# Patient Record
Sex: Female | Born: 1955
Health system: Southern US, Community
[De-identification: ages and names within clinical notes are randomized; demographics above are authoritative.]

## PROBLEM LIST (undated history)

## (undated) DIAGNOSIS — R609 Edema, unspecified: Secondary | ICD-10-CM

## (undated) DIAGNOSIS — E785 Hyperlipidemia, unspecified: Secondary | ICD-10-CM

## (undated) DIAGNOSIS — K529 Noninfective gastroenteritis and colitis, unspecified: Secondary | ICD-10-CM

## (undated) DIAGNOSIS — E559 Vitamin D deficiency, unspecified: Secondary | ICD-10-CM

## (undated) DIAGNOSIS — F419 Anxiety disorder, unspecified: Secondary | ICD-10-CM

## (undated) DIAGNOSIS — Z9889 Other specified postprocedural states: Secondary | ICD-10-CM

## (undated) DIAGNOSIS — R112 Nausea with vomiting, unspecified: Secondary | ICD-10-CM

## (undated) DIAGNOSIS — U071 COVID-19: Secondary | ICD-10-CM

## (undated) DIAGNOSIS — K219 Gastro-esophageal reflux disease without esophagitis: Secondary | ICD-10-CM

## (undated) DIAGNOSIS — M1990 Unspecified osteoarthritis, unspecified site: Secondary | ICD-10-CM

## (undated) DIAGNOSIS — I82402 Acute embolism and thrombosis of unspecified deep veins of left lower extremity: Secondary | ICD-10-CM

## (undated) DIAGNOSIS — Z9289 Personal history of other medical treatment: Secondary | ICD-10-CM

## (undated) DIAGNOSIS — N951 Menopausal and female climacteric states: Secondary | ICD-10-CM

## (undated) HISTORY — DX: Edema, unspecified: R60.9

## (undated) HISTORY — PX: ANTERIOR CRUCIATE LIGAMENT REPAIR: SHX115

## (undated) HISTORY — PX: BREAST BIOPSY: SHX20

## (undated) HISTORY — DX: Vitamin D deficiency, unspecified: E55.9

## (undated) HISTORY — PX: AUGMENTATION MAMMAPLASTY: SUR837

## (undated) HISTORY — DX: Hyperlipidemia, unspecified: E78.5

## (undated) HISTORY — PX: BREAST SURGERY: SHX581

## (undated) HISTORY — DX: Menopausal and female climacteric states: N95.1

## (undated) HISTORY — DX: Anxiety disorder, unspecified: F41.9

## (undated) HISTORY — DX: Gastro-esophageal reflux disease without esophagitis: K21.9

## (undated) HISTORY — PX: TONSILLECTOMY: SUR1361

---

## 1997-08-09 ENCOUNTER — Other Ambulatory Visit: Admission: RE | Admit: 1997-08-09 | Discharge: 1997-08-09 | Payer: Self-pay | Admitting: Obstetrics and Gynecology

## 1998-08-23 ENCOUNTER — Other Ambulatory Visit: Admission: RE | Admit: 1998-08-23 | Discharge: 1998-08-23 | Payer: Self-pay | Admitting: Obstetrics and Gynecology

## 1998-09-07 ENCOUNTER — Other Ambulatory Visit: Admission: RE | Admit: 1998-09-07 | Discharge: 1998-09-07 | Payer: Self-pay | Admitting: Radiology

## 1998-10-06 ENCOUNTER — Ambulatory Visit (HOSPITAL_COMMUNITY): Admission: RE | Admit: 1998-10-06 | Discharge: 1998-10-06 | Payer: Self-pay

## 1999-08-23 ENCOUNTER — Other Ambulatory Visit: Admission: RE | Admit: 1999-08-23 | Discharge: 1999-08-23 | Payer: Self-pay | Admitting: Obstetrics and Gynecology

## 1999-12-18 ENCOUNTER — Encounter (INDEPENDENT_AMBULATORY_CARE_PROVIDER_SITE_OTHER): Payer: Self-pay | Admitting: *Deleted

## 1999-12-18 ENCOUNTER — Ambulatory Visit (HOSPITAL_COMMUNITY): Admission: RE | Admit: 1999-12-18 | Discharge: 1999-12-18 | Payer: Self-pay

## 2000-09-03 ENCOUNTER — Other Ambulatory Visit: Admission: RE | Admit: 2000-09-03 | Discharge: 2000-09-03 | Payer: Self-pay | Admitting: Obstetrics and Gynecology

## 2001-09-25 ENCOUNTER — Other Ambulatory Visit: Admission: RE | Admit: 2001-09-25 | Discharge: 2001-09-25 | Payer: Self-pay | Admitting: Obstetrics and Gynecology

## 2002-10-13 ENCOUNTER — Other Ambulatory Visit: Admission: RE | Admit: 2002-10-13 | Discharge: 2002-10-13 | Payer: Self-pay | Admitting: Obstetrics and Gynecology

## 2003-01-27 ENCOUNTER — Encounter: Admission: RE | Admit: 2003-01-27 | Discharge: 2003-01-27 | Payer: Self-pay | Admitting: Obstetrics and Gynecology

## 2003-01-27 ENCOUNTER — Encounter: Payer: Self-pay | Admitting: Obstetrics and Gynecology

## 2003-10-20 ENCOUNTER — Other Ambulatory Visit: Admission: RE | Admit: 2003-10-20 | Discharge: 2003-10-20 | Payer: Self-pay | Admitting: Obstetrics and Gynecology

## 2004-06-15 ENCOUNTER — Encounter: Admission: RE | Admit: 2004-06-15 | Discharge: 2004-06-15 | Payer: Self-pay | Admitting: Obstetrics and Gynecology

## 2004-10-26 ENCOUNTER — Other Ambulatory Visit: Admission: RE | Admit: 2004-10-26 | Discharge: 2004-10-26 | Payer: Self-pay | Admitting: Obstetrics and Gynecology

## 2005-08-03 ENCOUNTER — Encounter: Admission: RE | Admit: 2005-08-03 | Discharge: 2005-08-03 | Payer: Self-pay | Admitting: Obstetrics and Gynecology

## 2005-10-29 ENCOUNTER — Other Ambulatory Visit: Admission: RE | Admit: 2005-10-29 | Discharge: 2005-10-29 | Payer: Self-pay | Admitting: Obstetrics and Gynecology

## 2006-09-24 ENCOUNTER — Encounter: Admission: RE | Admit: 2006-09-24 | Discharge: 2006-09-24 | Payer: Self-pay | Admitting: Obstetrics and Gynecology

## 2006-11-26 ENCOUNTER — Other Ambulatory Visit: Admission: RE | Admit: 2006-11-26 | Discharge: 2006-11-26 | Payer: Self-pay | Admitting: Obstetrics and Gynecology

## 2007-11-26 ENCOUNTER — Encounter: Admission: RE | Admit: 2007-11-26 | Discharge: 2007-11-26 | Payer: Self-pay | Admitting: Internal Medicine

## 2008-04-01 ENCOUNTER — Encounter: Payer: Self-pay | Admitting: Gastroenterology

## 2008-04-02 ENCOUNTER — Encounter: Payer: Self-pay | Admitting: Gastroenterology

## 2008-04-03 ENCOUNTER — Encounter: Payer: Self-pay | Admitting: Gastroenterology

## 2008-04-04 ENCOUNTER — Encounter: Payer: Self-pay | Admitting: Gastroenterology

## 2008-04-04 HISTORY — PX: COLONOSCOPY: SHX174

## 2008-04-05 ENCOUNTER — Encounter: Payer: Self-pay | Admitting: Gastroenterology

## 2008-04-08 ENCOUNTER — Ambulatory Visit: Payer: Self-pay | Admitting: Internal Medicine

## 2008-04-08 ENCOUNTER — Telehealth: Payer: Self-pay | Admitting: Nurse Practitioner

## 2008-04-12 ENCOUNTER — Ambulatory Visit: Payer: Self-pay | Admitting: Gastroenterology

## 2008-04-12 DIAGNOSIS — K559 Vascular disorder of intestine, unspecified: Secondary | ICD-10-CM

## 2008-04-26 ENCOUNTER — Ambulatory Visit: Payer: Self-pay | Admitting: Internal Medicine

## 2008-11-29 ENCOUNTER — Ambulatory Visit: Payer: Self-pay | Admitting: Internal Medicine

## 2008-12-13 ENCOUNTER — Ambulatory Visit: Payer: Self-pay | Admitting: Internal Medicine

## 2009-02-03 ENCOUNTER — Ambulatory Visit: Payer: Self-pay | Admitting: Internal Medicine

## 2009-02-14 ENCOUNTER — Ambulatory Visit: Payer: Self-pay | Admitting: Internal Medicine

## 2009-05-05 ENCOUNTER — Ambulatory Visit: Payer: Self-pay | Admitting: Internal Medicine

## 2009-07-18 ENCOUNTER — Ambulatory Visit: Payer: Self-pay | Admitting: Internal Medicine

## 2009-07-25 ENCOUNTER — Ambulatory Visit: Payer: Self-pay | Admitting: Internal Medicine

## 2009-08-01 ENCOUNTER — Ambulatory Visit: Payer: Self-pay | Admitting: Internal Medicine

## 2009-08-16 ENCOUNTER — Ambulatory Visit: Payer: Self-pay | Admitting: Internal Medicine

## 2009-11-29 ENCOUNTER — Ambulatory Visit: Payer: Self-pay | Admitting: Internal Medicine

## 2009-12-02 ENCOUNTER — Encounter: Admission: RE | Admit: 2009-12-02 | Discharge: 2009-12-02 | Payer: Self-pay | Admitting: Internal Medicine

## 2009-12-20 ENCOUNTER — Encounter: Admission: RE | Admit: 2009-12-20 | Discharge: 2009-12-20 | Payer: Self-pay | Admitting: Internal Medicine

## 2009-12-20 ENCOUNTER — Ambulatory Visit: Payer: Self-pay | Admitting: Internal Medicine

## 2010-04-07 ENCOUNTER — Ambulatory Visit: Payer: Self-pay | Admitting: Internal Medicine

## 2010-06-29 DIAGNOSIS — E785 Hyperlipidemia, unspecified: Secondary | ICD-10-CM

## 2010-09-07 ENCOUNTER — Other Ambulatory Visit: Payer: Self-pay | Admitting: Specialist

## 2010-09-07 DIAGNOSIS — M25521 Pain in right elbow: Secondary | ICD-10-CM

## 2010-09-15 ENCOUNTER — Other Ambulatory Visit: Payer: Self-pay | Admitting: Specialist

## 2010-09-15 DIAGNOSIS — M25521 Pain in right elbow: Secondary | ICD-10-CM

## 2010-09-18 ENCOUNTER — Ambulatory Visit
Admission: RE | Admit: 2010-09-18 | Discharge: 2010-09-18 | Disposition: A | Discharge status: Home / Self Care | Payer: BC Managed Care – PPO | Source: Ambulatory Visit | Attending: Specialist | Admitting: Specialist

## 2010-09-18 DIAGNOSIS — M25521 Pain in right elbow: Secondary | ICD-10-CM

## 2010-09-22 NOTE — Op Note (Signed)
[employer]  Patient:    Julia Murray, Julia Murray                       MRN: [id number] Proc. Date: 12/18/99 Adm. Date:  [id number] Disc. Date: [id number] Attending:  Gennie Alma CC:         Rande Brunt. Eda Paschal, M.D.   Operative Report  PREOPERATIVE DIAGNOSIS:  Nodule of right breast, subareolar.  POSTOPERATIVE DIAGNOSIS:  Nodule of right breast, subareolar.  OPERATION:  Excision of nodule of right breast, right breast biopsy.  SURGEON:  Milus Mallick, M.D.  ANESTHESIA:  Local infiltration with 1% Xylocaine - 15 cc and monitored anesthesia care.  DESCRIPTION OF PROCEDURE:  Under adequate perioperative intravenous sedation, the patients right breast was prepared and draped in usual fashion.  There was a 2 cm in diameter palpable nodule subadjacent to the inferior portion of the areolar of the right breast.  The area in question was infiltrated with 1% Xylocaine.  A periareolar incision was made from the 8 oclock to 4 oclock radial and carried down through subcutaneous tissue.  The breast tissue was exposed and the mass in question was palpated.  It was totally excised from the breast tissue surrounding it with electrocoagulation.  The specimen had the appearance of a fibroadenoma.  It was sent for routine pathologic study. The defect in the breast tissue was closed with interrupted sutures of 3-0 chromic catgut.  The subcuticular layer was then reapproximated with interrupted sutures of 5-0 Vicryl and the skin was approximated with half-inch Steri-Strips.  Sterile dressing were applied.  Estimated blood loss for the procedure was negligible.  The patient tolerated the procedure well and left the operating room in satisfactory condition. DD:  12/18/99 TD:  12/18/99 Job: [id number]

## 2010-10-04 ENCOUNTER — Other Ambulatory Visit: Payer: Self-pay

## 2010-11-01 ENCOUNTER — Encounter: Payer: Self-pay | Admitting: Internal Medicine

## 2010-11-06 ENCOUNTER — Ambulatory Visit: Payer: BC Managed Care – PPO | Admitting: Internal Medicine

## 2010-11-24 ENCOUNTER — Ambulatory Visit: Payer: BC Managed Care – PPO | Admitting: Internal Medicine

## 2010-11-24 ENCOUNTER — Other Ambulatory Visit: Payer: BC Managed Care – PPO | Admitting: Internal Medicine

## 2010-11-24 DIAGNOSIS — E785 Hyperlipidemia, unspecified: Secondary | ICD-10-CM

## 2010-11-24 DIAGNOSIS — Z79899 Other long term (current) drug therapy: Secondary | ICD-10-CM

## 2010-11-24 LAB — HEPATIC FUNCTION PANEL
ALT: 18 U/L (ref 0–35)
AST: 20 U/L (ref 0–37)
Alkaline Phosphatase: 63 U/L (ref 39–117)
Bilirubin, Direct: 0.2 mg/dL (ref 0.0–0.3)
Total Bilirubin: 0.6 mg/dL (ref 0.3–1.2)

## 2010-11-24 LAB — LIPID PANEL
Cholesterol: 210 mg/dL — ABNORMAL HIGH (ref 0–200)
HDL: 93 mg/dL (ref >39)
Total CHOL/HDL Ratio: 2.3 Ratio

## 2010-12-08 ENCOUNTER — Ambulatory Visit: Payer: BC Managed Care – PPO | Admitting: Internal Medicine

## 2011-05-09 ENCOUNTER — Ambulatory Visit (INDEPENDENT_AMBULATORY_CARE_PROVIDER_SITE_OTHER): Payer: BC Managed Care – PPO | Admitting: Internal Medicine

## 2011-05-09 ENCOUNTER — Encounter: Payer: Self-pay | Admitting: Internal Medicine

## 2011-05-09 VITALS — BP 128/82 | HR 84 | Temp 98.5°F | Wt 179.5 lb

## 2011-05-09 DIAGNOSIS — J069 Acute upper respiratory infection, unspecified: Secondary | ICD-10-CM

## 2011-05-14 ENCOUNTER — Other Ambulatory Visit: Payer: Self-pay | Admitting: Internal Medicine

## 2011-05-14 NOTE — Progress Notes (Signed)
  Subjective:    Patient ID: Julia Murray, female    DOB: 1956-02-21, 56 y.o.   MRN: [id number]  HPI Patient in today with cough and congestion. No significant sore throat. No fever or shaking chills. Just has malaise and fatigue. Onset of symptoms yesterday. Doesn't feel like working.    Review of Systems     Objective:   Physical Exam HEENT exam: Slightly injected pharynx without exudates; TMs are clear; neck is supple without adenopathy; chest clear        Assessment & Plan:  URI  Plan: Zithromax Z-Pak take 2 tablets day one followed by 1 tablet days 2 through 5. Hycodan 8 ounces 1 teaspoon by mouth every 6 hours when necessary cough

## 2011-05-14 NOTE — Patient Instructions (Signed)
Take Zithromax as directed. Take Hycodan as needed for cough. Tylenol as needed for fever or myalgias. Call if not better in one week or sooner if worse.

## 2011-06-22 ENCOUNTER — Telehealth: Payer: Self-pay | Admitting: Internal Medicine

## 2011-06-25 ENCOUNTER — Other Ambulatory Visit: Payer: Self-pay

## 2011-06-25 MED ORDER — ATORVASTATIN CALCIUM 20 MG PO TABS: 20.0000 mg | ORAL_TABLET | Freq: Every day | ORAL | Status: DC

## 2011-06-25 NOTE — Telephone Encounter (Signed)
Please call in Atoravastin (lipitor) with prn one year refills to [facility].

## 2011-07-02 ENCOUNTER — Telehealth: Payer: Self-pay

## 2011-07-02 NOTE — Telephone Encounter (Signed)
Left message on patient's home a nd work phone no. Lipitor rx could not be E-rx'd because her benefits are not managed by [facility] or [facility] any longer. Left message on 06/26/2011 as well, to get in touch with [place].

## 2011-07-06 ENCOUNTER — Other Ambulatory Visit: Payer: Self-pay

## 2011-07-06 MED ORDER — ATORVASTATIN CALCIUM 20 MG PO TABS: 20.0000 mg | ORAL_TABLET | Freq: Every day | ORAL | Status: DC

## 2011-07-06 MED ORDER — ESOMEPRAZOLE MAGNESIUM 40 MG PO CPDR: 40.0000 mg | DELAYED_RELEASE_CAPSULE | Freq: Every day | ORAL | Status: DC

## 2011-09-13 ENCOUNTER — Other Ambulatory Visit: Payer: Self-pay | Admitting: Internal Medicine

## 2012-01-23 ENCOUNTER — Other Ambulatory Visit: Payer: Self-pay

## 2012-01-23 DIAGNOSIS — M7989 Other specified soft tissue disorders: Secondary | ICD-10-CM

## 2012-01-23 DIAGNOSIS — I83893 Varicose veins of bilateral lower extremities with other complications: Secondary | ICD-10-CM

## 2012-02-12 ENCOUNTER — Encounter: Payer: BC Managed Care – PPO | Admitting: Vascular Surgery

## 2012-03-03 ENCOUNTER — Encounter: Payer: Self-pay | Admitting: Vascular Surgery

## 2012-03-04 ENCOUNTER — Encounter: Payer: BC Managed Care – PPO | Admitting: Vascular Surgery

## 2012-03-24 ENCOUNTER — Encounter: Payer: Self-pay | Admitting: Vascular Surgery

## 2012-03-25 ENCOUNTER — Encounter: Payer: BC Managed Care – PPO | Admitting: Vascular Surgery

## 2012-03-28 ENCOUNTER — Other Ambulatory Visit: Payer: BC Managed Care – PPO | Admitting: Internal Medicine

## 2012-03-28 DIAGNOSIS — Z0000 Encounter for general adult medical examination without abnormal findings: Secondary | ICD-10-CM

## 2012-03-28 DIAGNOSIS — Z79899 Other long term (current) drug therapy: Secondary | ICD-10-CM

## 2012-03-28 DIAGNOSIS — E785 Hyperlipidemia, unspecified: Secondary | ICD-10-CM

## 2012-03-29 LAB — COMPREHENSIVE METABOLIC PANEL
ALT: 14 U/L (ref 0–35)
AST: 15 U/L (ref 0–37)
Alkaline Phosphatase: 44 U/L (ref 39–117)
BUN: 18 mg/dL (ref 6–23)
Calcium: 9.4 mg/dL (ref 8.4–10.5)
Creat: 0.55 mg/dL (ref 0.50–1.10)
Total Bilirubin: 0.5 mg/dL (ref 0.3–1.2)

## 2012-03-29 LAB — LIPID PANEL
HDL: 84 mg/dL (ref >39)
LDL Cholesterol: 123 mg/dL — ABNORMAL HIGH (ref 0–99)
Total CHOL/HDL Ratio: 2.8 Ratio
VLDL: 31 mg/dL (ref 0–40)

## 2012-03-29 LAB — CBC WITH DIFFERENTIAL/PLATELET
Basophils Absolute: 0 10*3/uL (ref 0.0–0.1)
Basophils Relative: 0 % (ref 0–1)
Eosinophils Absolute: 0.1 10*3/uL (ref 0.0–0.7)
Eosinophils Relative: 2 % (ref 0–5)
HCT: 38.9 % (ref 36.0–46.0)
MCHC: 33.2 g/dL (ref 30.0–36.0)
MCV: 90.3 fL (ref 78.0–100.0)
Monocytes Absolute: 0.5 10*3/uL (ref 0.1–1.0)
Platelets: 294 10*3/uL (ref 150–400)
RDW: 13.9 % (ref 11.5–15.5)
WBC: 6.1 10*3/uL (ref 4.0–10.5)

## 2012-03-29 LAB — TSH: TSH: 1.238 u[IU]/mL (ref 0.350–4.500)

## 2012-03-31 ENCOUNTER — Ambulatory Visit (INDEPENDENT_AMBULATORY_CARE_PROVIDER_SITE_OTHER): Payer: BC Managed Care – PPO | Admitting: Internal Medicine

## 2012-03-31 ENCOUNTER — Encounter: Payer: Self-pay | Admitting: Internal Medicine

## 2012-03-31 VITALS — BP 132/78 | HR 84 | Temp 98.2°F | Ht 65.0 in | Wt 179.0 lb

## 2012-03-31 DIAGNOSIS — M25562 Pain in left knee: Secondary | ICD-10-CM

## 2012-03-31 DIAGNOSIS — Z0000 Encounter for general adult medical examination without abnormal findings: Secondary | ICD-10-CM

## 2012-03-31 DIAGNOSIS — Z23 Encounter for immunization: Secondary | ICD-10-CM

## 2012-03-31 DIAGNOSIS — E785 Hyperlipidemia, unspecified: Secondary | ICD-10-CM

## 2012-03-31 DIAGNOSIS — Z8639 Personal history of other endocrine, nutritional and metabolic disease: Secondary | ICD-10-CM

## 2012-03-31 DIAGNOSIS — F419 Anxiety disorder, unspecified: Secondary | ICD-10-CM

## 2012-03-31 DIAGNOSIS — M25569 Pain in unspecified knee: Secondary | ICD-10-CM

## 2012-03-31 DIAGNOSIS — K219 Gastro-esophageal reflux disease without esophagitis: Secondary | ICD-10-CM

## 2012-03-31 DIAGNOSIS — F411 Generalized anxiety disorder: Secondary | ICD-10-CM

## 2012-03-31 DIAGNOSIS — K559 Vascular disorder of intestine, unspecified: Secondary | ICD-10-CM

## 2012-03-31 LAB — POCT URINALYSIS DIPSTICK
Bilirubin, UA: NEGATIVE
Blood, UA: NEGATIVE
Glucose, UA: NEGATIVE
Leukocytes, UA: NEGATIVE
Nitrite, UA: NEGATIVE
Urobilinogen, UA: NEGATIVE

## 2012-03-31 MED ORDER — TETANUS-DIPHTH-ACELL PERTUSSIS 5-2.5-18.5 LF-MCG/0.5 IM SUSP: 0.5000 mL | Freq: Once | INTRAMUSCULAR | Status: DC

## 2012-03-31 MED ORDER — ATORVASTATIN CALCIUM 20 MG PO TABS: 20.0000 mg | ORAL_TABLET | Freq: Every day | ORAL | Status: DC

## 2012-04-01 MED ORDER — ATORVASTATIN CALCIUM 20 MG PO TABS: 20.0000 mg | ORAL_TABLET | Freq: Every day | ORAL | Status: DC

## 2012-04-04 DIAGNOSIS — K219 Gastro-esophageal reflux disease without esophagitis: Secondary | ICD-10-CM | POA: Insufficient documentation

## 2012-04-04 DIAGNOSIS — F419 Anxiety disorder, unspecified: Secondary | ICD-10-CM | POA: Insufficient documentation

## 2012-04-04 NOTE — Patient Instructions (Addendum)
Continue same medications and return in 6 months for office visit lipid panel liver functions

## 2012-04-04 NOTE — Progress Notes (Signed)
Subjective:    Patient ID: Julia Murray, female    DOB: [birth year], 56 y.o.   MRN: [id number]  HPI 56 year old white female in today for health maintenance exam. She is planning to have arthroscopic surgery in the near future on her knee and needs surgical clearance. She has a history of GE reflux and hyperlipidemia. She has a history of allergic rhinitis and has used Flonase in vera missed in the past. She has a history of vitamin D deficiency and has been told to take vitamin D 3 over-the-counter. GYN is Dr. Lavina Hamman.  In December 2009, she had a bout of ischemic colitis and was hospitalized in [facility]. This was left-sided ischemic colitis. She was treated with Flagyl and Levaquin. She had have a transfusion. Biopsies were compatible with ischemic colitis. She had a sudden bout of abdominal pain after coming back from eating a steak dinner. She's never had recurrence. Was seen by Dr. Russella Dar in followup. He did not feel she needed repeat colonoscopy after having inpatient colonoscopy at [facility].  No known drug allergies  Fractured left hand 1980s, torn anterior cruciate ligament 2007.  Has taken Maxzide daily for dependent edema, has taken Xanax when necessary anxiety. Was tried on Crestor for hyperlipidemia but she said it caused constipation. Since February 2012 on Lipitor 20 mg daily generic.  Patient is married. Husband is a vice president with [employer] 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 She completed 2 years of college. Does not smoke. Social alcohol consumption. Patient has 2 adult children a daughter and a son.  Family history: Father died at age 10 of heart failure. Mother with history of diabetes but living. One brother in good health. Father had gout, kidney stones and ulcers in addition to heart disease.     Review of Systems  Constitutional: Positive for fatigue.  HENT: Negative.   Eyes:  Negative.   Respiratory: Negative.   Cardiovascular: Negative.   Gastrointestinal:       History of ischemic colitis 2009  Genitourinary: Negative.   Musculoskeletal:       Left knee pain  Neurological: Negative.   Hematological: Negative.   Psychiatric/Behavioral:        Occasional anxiety       Objective:   Physical Exam  Vitals reviewed. Constitutional: She appears well-developed and well-nourished. No distress.  HENT:  Head: Normocephalic and atraumatic.  Right Ear: External ear normal.  Left Ear: External ear normal.  Mouth/Throat: Oropharynx is clear and moist. No oropharyngeal exudate.  Eyes: Conjunctivae normal and EOM are normal. Pupils are equal, round, and reactive to light. Right eye exhibits no discharge. Left eye exhibits no discharge. No scleral icterus.  Neck: Normal range of motion. Neck supple. No JVD present. No thyromegaly present.  Cardiovascular: Normal rate, regular rhythm, normal heart sounds and intact distal pulses.   No murmur heard. Pulmonary/Chest: Effort normal and breath sounds normal. She has no wheezes. She has no rales.       Breasts normal female. Tattoo upper left chest. Right nipple is pierced  Abdominal: Soft. Bowel sounds are normal. She exhibits no distension and no mass. There is no tenderness. There is no rebound and no guarding.  Genitourinary:       Deferred to GYN  Musculoskeletal: She exhibits no edema.  Lymphadenopathy:    She has no cervical adenopathy.  Neurological: She is alert. She has normal reflexes.  She displays normal reflexes. No cranial nerve deficit. Coordination normal.  Skin: Skin is warm and dry. No rash noted. She is not diaphoretic.  Psychiatric: She has a normal mood and affect. Her behavior is normal. Judgment and thought content normal.          Assessment & Plan:  Left knee internal derangement to have surgery by orthopedist in the near future  Hyperlipidemia-stable on Lipitor generic  Dependent  edema which she takes Maxzide 75/50 daily  Anxiety  History of vitamin D deficiency-patient is supposed to be taking vitamin D supplement over-the-counter  History of GE reflux treated with PPI  Plan: Patient cleared for orthopedic surgery which unstable be arthroscopic surgery. Return in 6 months for office visit lipid panel liver functions. No change in medication regimen.

## 2012-04-14 ENCOUNTER — Other Ambulatory Visit: Payer: Self-pay | Admitting: Orthopedic Surgery

## 2012-04-21 ENCOUNTER — Encounter: Payer: Self-pay | Admitting: Vascular Surgery

## 2012-04-22 ENCOUNTER — Encounter: Payer: Self-pay | Admitting: Vascular Surgery

## 2012-04-22 ENCOUNTER — Encounter (INDEPENDENT_AMBULATORY_CARE_PROVIDER_SITE_OTHER): Payer: BC Managed Care – PPO | Admitting: *Deleted

## 2012-04-22 ENCOUNTER — Ambulatory Visit (INDEPENDENT_AMBULATORY_CARE_PROVIDER_SITE_OTHER): Payer: BC Managed Care – PPO | Admitting: Vascular Surgery

## 2012-04-22 VITALS — BP 141/82 | HR 71 | Resp 16 | Ht 65.0 in | Wt 179.8 lb

## 2012-04-22 DIAGNOSIS — I8010 Phlebitis and thrombophlebitis of unspecified femoral vein: Secondary | ICD-10-CM

## 2012-04-22 DIAGNOSIS — M7989 Other specified soft tissue disorders: Secondary | ICD-10-CM

## 2012-04-22 DIAGNOSIS — I83893 Varicose veins of bilateral lower extremities with other complications: Secondary | ICD-10-CM

## 2012-04-22 NOTE — Progress Notes (Signed)
[facility]   Patient name: Julia Murray MRN: [id number] DOB: 27-Sep-1955 Sex: female   Referred by: Shelle Iron  Reason for referral:  Chief Complaint  Patient presents with  . Venous Insufficiency    New pt, left leg edema/ vv's on left - Dr. Jene Every    HISTORY OF PRESENT ILLNESS: Patient is a healthy 56 year old white female who seen today for evaluation of chronic swelling in her left leg. She reports that this occurred around the time of the knee surgery several years ago and has been stable since that time. She does not have any history of DVT. She reports the swelling is more progress towards the end of the day. She does not recall any trauma and is specifically denies any change over the past several years. She has no history of venous ulcers. She does not have any known history of DVT. Her family history is noted for "phlebitis" in her mother.  Past Medical History  Diagnosis Date  . Anxiety   . Hyperlipidemia   . Dependent edema   . Perimenopausal   . Vitamin D deficiency disease   . GERD (gastroesophageal reflux disease)     Past Surgical History  Procedure Date  . Anterior cruciate ligament repair     left    History   Social History  . Marital Status: Married    Spouse Name: N/A    Number of Children: N/A  . Years of Education: N/A   Occupational History  . Not on file.   Social History Main Topics  . Smoking status: Current Every Day Smoker    Types: Cigarettes  . Smokeless tobacco: Never Used  . Alcohol Use: 1.8 - 2.4 oz/week    3-4 Shots of liquor per week  . Drug Use: No  . Sexually Active: Not on file   Other Topics Concern  . Not on file   Social History Narrative  . No narrative on file    Family History  Problem Relation Age of Onset  . Heart disease Father   . Heart attack Father   . Diabetes Mother     Allergies as of 04/22/2012 - Review Complete 04/22/2012  Allergen Reaction Noted  . Shellfish  allergy Hives 05/09/2011    Current Outpatient Prescriptions on File Prior to Visit  Medication Sig Dispense Refill  . atorvastatin (LIPITOR) 20 MG tablet Take 1 tablet (20 mg total) by mouth daily.  90 tablet  1  . esomeprazole (NEXIUM) 40 MG capsule Take 1 capsule (40 mg total) by mouth daily before breakfast.  90 capsule  1  . estradiol (ESTRACE) 0.5 MG tablet Take 0.5 mg by mouth daily.      . Estradiol-Norethindrone Acet 0.5-0.1 MG per tablet       . fluticasone (FLONASE) 50 MCG/ACT nasal spray USE 2 SPRAYS IN EACH NOSTRIL EVERY MORNING  16 g  11  . fluticasone (VERAMYST) 27.5 MCG/SPRAY nasal spray Place 2 sprays into the nose daily.           REVIEW OF SYSTEMS:  Positives indicated with an "X"  CARDIOVASCULAR:  [ ]  chest pain   [ ]  chest pressure   [ ]  palpitations   [ ]  orthopnea   [ ]  dyspnea on exertion   [ ]  claudication   [ ]  rest pain   [ ]  DVT   [ ]  phlebitis PULMONARY:   [ ]  productive cough   [ ]  asthma   [ ]   wheezing NEUROLOGIC:   [ ]  weakness  [ ]  paresthesias  [ ]  aphasia  [ ]  amaurosis  [ ]  dizziness HEMATOLOGIC:   [ ]  bleeding problems   [ ]  clotting disorders MUSCULOSKELETAL:  [ ]  joint pain   [x ] joint swelling GASTROINTESTINAL: [ ]   blood in stool  [ ]   hematemesis GENITOURINARY:  [ ]   dysuria  [ ]   hematuria PSYCHIATRIC:  [ ]  history of major depression INTEGUMENTARY:  [ ]  rashes  [ ]  ulcers CONSTITUTIONAL:  [ ]  fever   [ ]  chills  PHYSICAL EXAMINATION:  General: The patient is a well-nourished female, in no acute distress. Vital signs are BP 141/82  Pulse 71  Resp 16  Ht 5\' 5"  (1.651 m)  Wt 179 lb 12.8 oz (81.557 kg)  BMI 29.92 kg/m2  SpO2 100% Pulmonary: There is a good air exchange bilaterally without wheezing or rales. Abdomen: Soft and non-tender with normal pitch bowel sounds. Musculoskeletal: There are no major deformities.  There is no significant extremity pain. Neurologic: No focal weakness or paresthesias are detected, Skin: There are no  ulcer or rashes noted. Psychiatric: The patient has normal affect. Cardiovascular: There is a regular rate and rhythm without significant murmur appreciated. Carotid arteries without bruits bilaterally 2+ dorsalis pedis pulses bilaterally She does have swelling in her left leg from her knee distally onto her foot.  [facility] Vascular Lab Studies:  Ordered and Independently Reviewed . This reveals thrombus in her left distal femoral vein and popliteal vein. This does have an acoustic shadowing which suggests chronic clot. She does not have any superficial venous reflux.  Impression and Plan:  Deep venous thrombosis in her left distal femoral vein and popliteal vein. This in all likelihood this chronic since she has had no changes in her symptoms regarding pain or swelling over the course of several years. I explained that this is duplex is not 100% at this is not acute however this would be quite unusual since she has had no change in her symptoms for years. I would not recommend anticoagulation. I did explain the importance of graduated compression and we fitted her today for knee-high compression garments explained the importance of the use of these for symptom relief now but more importantly for reduce risk for venous hypertension sequela in years to come. She understands this discussion will see [place] again on an as-needed basis    Neysha Criado [facility] [place] Office: [phone]

## 2012-04-22 NOTE — Addendum Note (Signed)
Addended by: Domenic Moras D on: 04/22/2012 01:51 PM   Modules accepted: Orders

## 2012-04-23 ENCOUNTER — Encounter (HOSPITAL_BASED_OUTPATIENT_CLINIC_OR_DEPARTMENT_OTHER): Payer: Self-pay | Admitting: *Deleted

## 2012-04-23 NOTE — Progress Notes (Signed)
Chart reviewed w Dr. Germeroth.  Ok to proceed. 

## 2012-04-25 ENCOUNTER — Encounter (HOSPITAL_BASED_OUTPATIENT_CLINIC_OR_DEPARTMENT_OTHER): Payer: Self-pay

## 2012-04-25 ENCOUNTER — Encounter (HOSPITAL_BASED_OUTPATIENT_CLINIC_OR_DEPARTMENT_OTHER)
Admission: RE | Disposition: A | Discharge status: Home / Self Care | Payer: Self-pay | Source: Ambulatory Visit | Attending: Specialist

## 2012-04-25 ENCOUNTER — Ambulatory Visit (HOSPITAL_BASED_OUTPATIENT_CLINIC_OR_DEPARTMENT_OTHER)
Admission: RE | Admit: 2012-04-25 | Discharge: 2012-04-25 | Disposition: A | Discharge status: Home / Self Care | Payer: BC Managed Care – PPO | Source: Ambulatory Visit | Attending: Specialist | Admitting: Specialist

## 2012-04-25 ENCOUNTER — Encounter (HOSPITAL_BASED_OUTPATIENT_CLINIC_OR_DEPARTMENT_OTHER): Payer: Self-pay | Admitting: Anesthesiology

## 2012-04-25 ENCOUNTER — Ambulatory Visit (HOSPITAL_BASED_OUTPATIENT_CLINIC_OR_DEPARTMENT_OTHER): Payer: BC Managed Care – PPO | Admitting: Anesthesiology

## 2012-04-25 ENCOUNTER — Other Ambulatory Visit: Payer: Self-pay

## 2012-04-25 DIAGNOSIS — M2340 Loose body in knee, unspecified knee: Secondary | ICD-10-CM | POA: Insufficient documentation

## 2012-04-25 DIAGNOSIS — Z79899 Other long term (current) drug therapy: Secondary | ICD-10-CM | POA: Insufficient documentation

## 2012-04-25 DIAGNOSIS — M1710 Unilateral primary osteoarthritis, unspecified knee: Secondary | ICD-10-CM

## 2012-04-25 DIAGNOSIS — Z86718 Personal history of other venous thrombosis and embolism: Secondary | ICD-10-CM | POA: Insufficient documentation

## 2012-04-25 DIAGNOSIS — E785 Hyperlipidemia, unspecified: Secondary | ICD-10-CM | POA: Insufficient documentation

## 2012-04-25 DIAGNOSIS — M23302 Other meniscus derangements, unspecified lateral meniscus, unspecified knee: Secondary | ICD-10-CM | POA: Insufficient documentation

## 2012-04-25 DIAGNOSIS — M19019 Primary osteoarthritis, unspecified shoulder: Secondary | ICD-10-CM | POA: Insufficient documentation

## 2012-04-25 DIAGNOSIS — M2350 Chronic instability of knee, unspecified knee: Secondary | ICD-10-CM | POA: Insufficient documentation

## 2012-04-25 DIAGNOSIS — K219 Gastro-esophageal reflux disease without esophagitis: Secondary | ICD-10-CM | POA: Insufficient documentation

## 2012-04-25 DIAGNOSIS — M23305 Other meniscus derangements, unspecified medial meniscus, unspecified knee: Secondary | ICD-10-CM | POA: Insufficient documentation

## 2012-04-25 HISTORY — DX: Noninfective gastroenteritis and colitis, unspecified: K52.9

## 2012-04-25 HISTORY — DX: Personal history of other medical treatment: Z92.89

## 2012-04-25 HISTORY — PX: KNEE ARTHROSCOPY WITH MEDIAL MENISECTOMY: SHX5651

## 2012-04-25 HISTORY — DX: Acute embolism and thrombosis of unspecified deep veins of left lower extremity: I82.402

## 2012-04-25 SURGERY — ARTHROSCOPY, KNEE, WITH MEDIAL MENISCECTOMY
Anesthesia: General | Site: Knee | Laterality: Right | Wound class: Clean

## 2012-04-25 MED ORDER — LACTATED RINGERS IV SOLN
INTRAVENOUS | Status: DC
  Filled 2012-04-25: qty 1000

## 2012-04-25 MED ORDER — FENTANYL CITRATE 0.05 MG/ML IJ SOLN
INTRAMUSCULAR | Status: DC | PRN
  Administered 2012-04-25 (×2): 50 ug via INTRAVENOUS
  Administered 2012-04-25 (×2): 25 ug via INTRAVENOUS
  Administered 2012-04-25: 50 ug via INTRAVENOUS

## 2012-04-25 MED ORDER — CEFAZOLIN SODIUM-DEXTROSE 2-3 GM-% IV SOLR
2.0000 g | INTRAVENOUS | Status: AC
  Administered 2012-04-25: 2 g via INTRAVENOUS
  Filled 2012-04-25: qty 50

## 2012-04-25 MED ORDER — MIDAZOLAM HCL 5 MG/5ML IJ SOLN
INTRAMUSCULAR | Status: DC | PRN
  Administered 2012-04-25: 2 mg via INTRAVENOUS

## 2012-04-25 MED ORDER — ACETAMINOPHEN 10 MG/ML IV SOLN
INTRAVENOUS | Status: DC | PRN
  Administered 2012-04-25: 1000 mg via INTRAVENOUS

## 2012-04-25 MED ORDER — LACTATED RINGERS IV SOLN
INTRAVENOUS | Status: DC
  Administered 2012-04-25 (×2): via INTRAVENOUS
  Filled 2012-04-25: qty 1000

## 2012-04-25 MED ORDER — DEXAMETHASONE SODIUM PHOSPHATE 4 MG/ML IJ SOLN
INTRAMUSCULAR | Status: DC | PRN
  Administered 2012-04-25: 10 mg via INTRAVENOUS

## 2012-04-25 MED ORDER — EPINEPHRINE HCL 1 MG/ML IJ SOLN
INTRAMUSCULAR | Status: DC | PRN
  Administered 2012-04-25: 2 mg

## 2012-04-25 MED ORDER — EPHEDRINE SULFATE 50 MG/ML IJ SOLN
INTRAMUSCULAR | Status: DC | PRN
  Administered 2012-04-25 (×2): 10 mg via INTRAVENOUS

## 2012-04-25 MED ORDER — SODIUM CHLORIDE 0.9 % IR SOLN
Status: DC | PRN
  Administered 2012-04-25: 6000 mL

## 2012-04-25 MED ORDER — BUPIVACAINE-EPINEPHRINE 0.5% -1:200000 IJ SOLN
INTRAMUSCULAR | Status: DC | PRN
  Administered 2012-04-25: 30 mL

## 2012-04-25 MED ORDER — PROPOFOL 10 MG/ML IV BOLUS
INTRAVENOUS | Status: DC | PRN
  Administered 2012-04-25: 200 mg via INTRAVENOUS

## 2012-04-25 MED ORDER — FENTANYL CITRATE 0.05 MG/ML IJ SOLN
25.0000 ug | INTRAMUSCULAR | Status: DC | PRN
  Filled 2012-04-25: qty 1

## 2012-04-25 MED ORDER — CHLORHEXIDINE GLUCONATE 4 % EX LIQD
60.0000 mL | Freq: Once | CUTANEOUS | Status: DC
  Filled 2012-04-25: qty 60

## 2012-04-25 MED ORDER — KETOROLAC TROMETHAMINE 30 MG/ML IJ SOLN
INTRAMUSCULAR | Status: DC | PRN
  Administered 2012-04-25: 30 mg via INTRAVENOUS

## 2012-04-25 MED ORDER — LIDOCAINE HCL (CARDIAC) 20 MG/ML IV SOLN
INTRAVENOUS | Status: DC | PRN
  Administered 2012-04-25: 60 mg via INTRAVENOUS

## 2012-04-25 MED ORDER — KETOROLAC TROMETHAMINE 10 MG PO TABS: 10.0000 mg | ORAL_TABLET | Freq: Four times a day (QID) | ORAL | Status: DC | PRN

## 2012-04-25 MED ORDER — ONDANSETRON HCL 4 MG/2ML IJ SOLN
INTRAMUSCULAR | Status: DC | PRN
  Administered 2012-04-25: 4 mg via INTRAVENOUS

## 2012-04-25 MED ORDER — OXYCODONE-ACETAMINOPHEN 5-325 MG PO TABS: 1.0000 | ORAL_TABLET | ORAL | Status: DC | PRN

## 2012-04-25 MED ORDER — PROMETHAZINE HCL 25 MG/ML IJ SOLN
6.2500 mg | INTRAMUSCULAR | Status: DC | PRN
  Filled 2012-04-25: qty 1

## 2012-04-25 SURGICAL SUPPLY — 45 items
BANDAGE ELASTIC 6 VELCRO ST LF (GAUZE/BANDAGES/DRESSINGS) ×2 IMPLANT
BLADE 4.2CUDA (BLADE) IMPLANT
BLADE CUDA SHAVER 3.5 (BLADE) ×2 IMPLANT
BLADE GREAT WHITE 4.2 (BLADE) IMPLANT
BOOTIES KNEE HIGH SLOAN (MISCELLANEOUS) ×2 IMPLANT
CANISTER SUCT LVC 12 LTR MEDI- (MISCELLANEOUS) ×2 IMPLANT
CANISTER SUCTION 1200CC (MISCELLANEOUS) IMPLANT
CANISTER SUCTION 2500CC (MISCELLANEOUS) ×1 IMPLANT
CANNULA ACUFLEX KIT 5X76 (CANNULA) IMPLANT
CLOTH BEACON ORANGE TIMEOUT ST (SAFETY) ×2 IMPLANT
CUTTER MENISCUS  4.2MM (BLADE)
CUTTER MENISCUS 4.2MM (BLADE) IMPLANT
DRAPE ARTHROSCOPY W/POUCH 114 (DRAPES) ×2 IMPLANT
DRSG EMULSION OIL 3X3 NADH (GAUZE/BANDAGES/DRESSINGS) ×1 IMPLANT
DRSG PAD ABDOMINAL 8X10 ST (GAUZE/BANDAGES/DRESSINGS) ×1 IMPLANT
DURAPREP 26ML APPLICATOR (WOUND CARE) ×2 IMPLANT
ELECT REM PT RETURN 9FT ADLT (ELECTROSURGICAL)
ELECTRODE REM PT RTRN 9FT ADLT (ELECTROSURGICAL) IMPLANT
GLOVE BIO SURGEON STRL SZ 6.5 (GLOVE) ×1 IMPLANT
GLOVE BIOGEL PI IND STRL 7.5 (GLOVE) IMPLANT
GLOVE BIOGEL PI INDICATOR 7.5 (GLOVE) ×1
GLOVE SURG SS PI 8.0 STRL IVOR (GLOVE) ×2 IMPLANT
GOWN PREVENTION PLUS LG XLONG (DISPOSABLE) ×2 IMPLANT
GOWN STRL REIN XL XLG (GOWN DISPOSABLE) ×2 IMPLANT
IV NS IRRIG 3000ML ARTHROMATIC (IV SOLUTION) ×4 IMPLANT
KNEE WRAP E Z 3 GEL PACK (MISCELLANEOUS) ×2 IMPLANT
MINI VAC (SURGICAL WAND) ×1 IMPLANT
NDL FILTER BLUNT 18X1 1/2 (NEEDLE) ×1 IMPLANT
NDL SAFETY ECLIPSE 18X1.5 (NEEDLE) ×2 IMPLANT
NEEDLE FILTER BLUNT 18X 1/2SAF (NEEDLE) ×1
NEEDLE FILTER BLUNT 18X1 1/2 (NEEDLE) ×1 IMPLANT
NEEDLE HYPO 18GX1.5 SHARP (NEEDLE) ×4
PACK ARTHROSCOPY DSU (CUSTOM PROCEDURE TRAY) ×2 IMPLANT
PACK BASIN DAY SURGERY FS (CUSTOM PROCEDURE TRAY) ×2 IMPLANT
PADDING CAST COTTON 6X4 STRL (CAST SUPPLIES) ×2 IMPLANT
RESECTOR FULL RADIUS 4.2MM (BLADE) IMPLANT
SET ARTHROSCOPY TUBING (MISCELLANEOUS) ×2
SET ARTHROSCOPY TUBING LN (MISCELLANEOUS) ×1 IMPLANT
SPONGE GAUZE 4X4 12PLY (GAUZE/BANDAGES/DRESSINGS) ×2 IMPLANT
SUT ETHILON 4 0 PS 2 18 (SUTURE) ×2 IMPLANT
SYR 30ML LL (SYRINGE) ×2 IMPLANT
SYRINGE 10CC LL (SYRINGE) ×2 IMPLANT
TOWEL OR 17X24 6PK STRL BLUE (TOWEL DISPOSABLE) ×2 IMPLANT
WAND 90 DEG TURBOVAC W/CORD (SURGICAL WAND) IMPLANT
WATER STERILE IRR 500ML POUR (IV SOLUTION) ×2 IMPLANT

## 2012-04-25 NOTE — Anesthesia Procedure Notes (Signed)
Procedure Name: LMA Insertion Date/Time: 04/25/2012 1:15 PM Performed by: Norva Pavlov Pre-anesthesia Checklist: Patient identified, Emergency Drugs available, Suction available and Patient being monitored Patient Re-evaluated:Patient Re-evaluated prior to inductionOxygen Delivery Method: Circle System Utilized Preoxygenation: Pre-oxygenation with 100% oxygen Intubation Type: IV induction Ventilation: Mask ventilation without difficulty LMA: LMA inserted LMA Size: 4.0 Number of attempts: 1 Airway Equipment and Method: bite block Placement Confirmation: positive ETCO2 Tube secured with: Tape Dental Injury: Teeth and Oropharynx as per pre-operative assessment

## 2012-04-25 NOTE — Anesthesia Postprocedure Evaluation (Signed)
Anesthesia Post Note  Patient: Julia Murray  Procedure(s) Performed: Procedure(s) (LRB): KNEE ARTHROSCOPY WITH MEDIAL MENISECTOMY (Right)  Anesthesia type: General  Patient location: PACU  Post pain: Pain level controlled  Post assessment: Post-op Vital signs reviewed  Last Vitals:  Filed Vitals:   04/25/12 1416  BP:   Pulse: 82  Temp: 36.1 C  Resp: 17    Post vital signs: Reviewed  Level of consciousness: sedated  Complications: No apparent anesthesia complications

## 2012-04-25 NOTE — Brief Op Note (Signed)
04/25/2012  2:07 PM  PATIENT:  Julia Murray  56 y.o. female  PRE-OPERATIVE DIAGNOSIS:  medial and lateral mensicus tear  POST-OPERATIVE DIAGNOSIS:  medial and lateral meniscus tear  PROCEDURE:  Procedure(s) (LRB) with comments: KNEE ARTHROSCOPY WITH MEDIAL MENISECTOMY (Right) - Right Knee Arthrscopy with Partial Medial and Lateral Menisectomy with Removal of Loose Bodies  SURGEON:  Surgeon(s) and Role:    * Javier Docker, MD - Primary  PHYSICIAN ASSISTANT:   ASSISTANTS: none   ANESTHESIA:   general  EBL:  Total I/O In: 1000 [I.V.:1000] Out: -   BLOOD ADMINISTERED:none  DRAINS: none   LOCAL MEDICATIONS USED:  MARCAINE     SPECIMEN:  No Specimen  DISPOSITION OF SPECIMEN:  PATHOLOGY  COUNTS:  YES  TOURNIQUET:  * No tourniquets in log *  DICTATION: .Other Dictation: Dictation Number [id number]  PLAN OF CARE: Discharge to home after PACU  PATIENT DISPOSITION:  PACU - hemodynamically stable.   Delay start of Pharmacological VTE agent (>24hrs) due to surgical blood loss or risk of bleeding: no

## 2012-04-25 NOTE — Anesthesia Preprocedure Evaluation (Addendum)
Anesthesia Evaluation  Patient identified by MRN, date of birth, ID band Patient awake    Reviewed: Allergy & Precautions, H&P , NPO status , Patient's Chart, lab work & pertinent test results, reviewed documented beta blocker date and time   Airway Mallampati: II TM Distance: >3 FB Neck ROM: full    Dental No notable dental hx.    Pulmonary neg pulmonary ROS,  breath sounds clear to auscultation  Pulmonary exam normal       Cardiovascular Exercise Tolerance: Good + Peripheral Vascular Disease Rhythm:regular Rate:Normal  Evaluated by Dr. Arbie Cookey 04/22/12 for chronic swelling of left leg and diagnosed with chronic, but not acute DVT. Dr. Shelle Iron aware.   Neuro/Psych negative neurological ROS  negative psych ROS   GI/Hepatic negative GI ROS, Neg liver ROS, GERD-  Medicated,  Endo/Other  negative endocrine ROS  Renal/GU negative Renal ROS  negative genitourinary   Musculoskeletal   Abdominal   Peds  Hematology negative hematology ROS (+)   Anesthesia Other Findings   Reproductive/Obstetrics negative OB ROS                          Anesthesia Physical Anesthesia Plan  ASA: II  Anesthesia Plan: General   Post-op Pain Management:    Induction:   Airway Management Planned: LMA  Additional Equipment:   Intra-op Plan:   Post-operative Plan:   Informed Consent: I have reviewed the patients History and Physical, chart, labs and discussed the procedure including the risks, benefits and alternatives for the proposed anesthesia with the patient or authorized representative who has indicated his/her understanding and acceptance.   Dental Advisory Given  Plan Discussed with: CRNA  Anesthesia Plan Comments:         Anesthesia Quick Evaluation

## 2012-04-25 NOTE — H&P (Signed)
Julia Murray is an 56 y.o. female.   Chief Complaint: right knee pain HPI: Chronic DJD, ACL deficiency refractory  Past Medical History  Diagnosis Date  . Anxiety   . Hyperlipidemia   . Dependent edema   . Perimenopausal   . Vitamin D deficiency disease   . GERD (gastroesophageal reflux disease)   . Colitis 11/11    hx gi bleed ; no colitis since  . History of blood transfusion 11/11     d/t acute colitis  . Deep vein blood clot of left lower extremity     saw Dr. Arbie Cookey 12/17 , has clot behind l knee currently    Past Surgical History  Procedure Date  . Anterior cruciate ligament repair     left  . Tonsillectomy   . Breast surgery     mutiple bx    Family History  Problem Relation Age of Onset  . Heart disease Father   . Heart attack Father   . Diabetes Mother    Social History:  reports that she has never smoked. She has never used smokeless tobacco. She reports that she drinks about 3 ounces of alcohol per week. She reports that she does not use illicit drugs.  Allergies:  Allergies  Allergen Reactions  . Shellfish Allergy Hives    Pt States betadine ok    Medications Prior to Admission  Medication Sig Dispense Refill  . atorvastatin (LIPITOR) 20 MG tablet Take 1 tablet (20 mg total) by mouth daily.  90 tablet  1  . esomeprazole (NEXIUM) 40 MG capsule Take 1 capsule (40 mg total) by mouth daily before breakfast.  90 capsule  1  . estradiol (ESTRACE) 0.5 MG tablet Take 0.5 mg by mouth daily.      . fluticasone (FLONASE) 50 MCG/ACT nasal spray USE 2 SPRAYS IN EACH NOSTRIL EVERY MORNING  16 g  11  . fluticasone (VERAMYST) 27.5 MCG/SPRAY nasal spray Place 2 sprays into the nose daily.       Marland Kitchen ibuprofen (ADVIL,MOTRIN) 800 MG tablet Take 1 tablet by mouth as needed.      . Estradiol-Norethindrone Acet 0.5-0.1 MG per tablet         Results for orders placed during the hospital encounter of 04/25/12 (from the past 48 hour(s))  POCT HEMOGLOBIN-HEMACUE     Status:  Abnormal   Collection Time   04/25/12 11:28 AM      Component Value Range Comment   Hemoglobin 10.4 (*) 12.0 - 15.0 g/dL    No results found.  Review of Systems  Musculoskeletal: Positive for joint pain.    Blood pressure 125/79, pulse 73, temperature 97.8 F (36.6 C), temperature source Oral, resp. rate 18, height 5\' 6"  (1.676 m), weight 81.194 kg (179 lb), SpO2 99.00%. Physical Exam  Constitutional: She is oriented to person, place, and time. She appears well-developed.  HENT:  Head: Normocephalic.  Eyes: Pupils are equal, round, and reactive to light.  Neck: Normal range of motion.  Cardiovascular: Normal rate.   GI: Soft.  Musculoskeletal:       MJLT, LJLT, +effusion Sl ant drawer no dvt.  Neurological: She is alert and oriented to person, place, and time.  Skin: Skin is warm and dry.   MRI loose bodys 1.5cm, ACL chronic tear DJD some Grade 4  Assessment/Plan Right knee pain mechanical Sx. DJD loose bodies, ACL chronic. Plan arthroscopy debridement eval. Risks discussed  Juhi Lagrange C 04/25/2012, 2:03 PM

## 2012-04-25 NOTE — Transfer of Care (Signed)
Immediate Anesthesia Transfer of Care Note  Patient: Julia Murray  Procedure(s) Performed: Procedure(s) (LRB): KNEE ARTHROSCOPY WITH MEDIAL MENISECTOMY (Right)  Patient Location: PACU  Anesthesia Type: General  Level of Consciousness: awake, alert  and oriented  Airway & Oxygen Therapy: Patient Spontanous Breathing and Patient connected to face mask oxygen  Post-op Assessment: Report given to PACU RN and Post -op Vital signs reviewed and stable  Post vital signs: Reviewed and stable  Complications: No apparent anesthesia complications

## 2012-04-28 ENCOUNTER — Encounter (HOSPITAL_BASED_OUTPATIENT_CLINIC_OR_DEPARTMENT_OTHER): Payer: Self-pay | Admitting: Specialist

## 2012-04-28 NOTE — Op Note (Signed)
Julia Murray, Julia Murray                ACCOUNT NO.:  [id number]  MEDICAL RECORD NO.:  [id number]  LOCATION:                               FACILITY:  [facility]  PHYSICIAN:  Jene Every, M.D.    DATE OF BIRTH:  [birth year]  DATE OF PROCEDURE:  04/25/2012 DATE OF DISCHARGE:  04/25/2012                              OPERATIVE REPORT   PREOPERATIVE DIAGNOSES:  Degenerative joint disease, chronic anterior cruciate ligament tear, right knee; medial and lateral menisci tears; loose bodies; mechanical symptoms; pain.  HISTORY:  A 56 year old with long history of chronic ACL, degenerative changes, particularly at lateral compartment of the right knee.  She had corticosteroid injection, activity modification, home exercise program. Had mechanical symptoms, possibly secondary to her loose bodies, meniscal tears, ACL injury were discussed, and total knee versus arthroscopic debridement.  We chose the more conservative approach, hoping that the evacuation of loose bodies and debridements would spare her or at least in some time until a total knee would be required.  We discussed risks and benefits including bleeding, infection, DVT, PE, anesthetic complications, no change in symptoms, worrisome symptoms, need for repeat debridement, DVT, PE, anesthetic complications, etc.  TECHNIQUE:  With the patient in supine position, after induction of adequate general anesthesia, 2 g of Kefzol, the right lower extremity was prepped and draped in usual fashion.  Exam under anesthesia revealed slight anterior drawer as noted.  A lateral parapatellar portal was fashioned with #11 blade.  Ingress cannula atraumatically placed.  Irrigant was utilized to insufflate the joint.  Under direct visualization, medial parapatellar portal was fashioned with a #11 blade, sparing the medial meniscus.  This was localized with an 18-gauge needle first.  Noted intraoperatively was a near-complete deficiency of the ACL.  Loose  bodies noted at the attachment on the tibial spine both at the lateral compartment and anteriorly.  These were isolated, mobilized, and retrieved with a pituitary.  Intraoperative anterior drawer revealed some minor residual fragments of the ACL, perhaps 75-80% was essentially deficient.  Into the medial compartment, we evaluated that.  There were some grade 4 changes, loose cartilaginous debris.  This was debrided as well. Laterally, it was grade 4 changes, femoral condyle and tibial plateau. The tear of the anterior aspect of the lateral meniscus was debrided to a stable brace.  With gentle observation in the medial compartment, it felt there was some slight translation in the tibia, consistent with perhaps ACL deficiency.  We copiously lavaged.  Exam of the suprapatellar pouch, there were some extensive grade 3 changes and minor grade 4 changes here and into the superior aspect of the sulcus, like chondroplasty performed here.  We copiously lavaged the knee.  The gutters were unremarkable.  There appeared to be normal tracking.  There is entrapment with significant grade 4 changes.  Next, I removed all instrumentation.  Portals were closed with 4-0 nylon simple sutures, 0.25% Marcaine with epinephrine was infiltrated into the joint.  Wound was dressed sterilely, awoken without difficulty, and transported to recovery room in satisfactory condition.  The patient tolerated the procedure well.  No complications.  No assistant.     Jene Every, M.D.  JB/MEDQ  D:  04/25/2012  T:  04/26/2012  Job:  [id number]

## 2012-05-20 ENCOUNTER — Other Ambulatory Visit: Payer: Self-pay | Admitting: Orthopedic Surgery

## 2012-05-20 ENCOUNTER — Encounter (HOSPITAL_COMMUNITY)
Admission: RE | Disposition: A | Discharge status: Home Health | Payer: Self-pay | Source: Ambulatory Visit | Attending: Specialist

## 2012-05-20 ENCOUNTER — Encounter (HOSPITAL_COMMUNITY): Payer: Self-pay | Admitting: Orthopedic Surgery

## 2012-05-20 ENCOUNTER — Ambulatory Visit (HOSPITAL_BASED_OUTPATIENT_CLINIC_OR_DEPARTMENT_OTHER): Payer: BC Managed Care – PPO | Admitting: Anesthesiology

## 2012-05-20 ENCOUNTER — Inpatient Hospital Stay (HOSPITAL_BASED_OUTPATIENT_CLINIC_OR_DEPARTMENT_OTHER)
Admission: RE | Admit: 2012-05-20 | Discharge: 2012-05-21 | DRG: 583 | Disposition: A | Discharge status: Home Health | Payer: BC Managed Care – PPO | Source: Ambulatory Visit | Attending: Specialist | Admitting: Specialist

## 2012-05-20 ENCOUNTER — Encounter (HOSPITAL_BASED_OUTPATIENT_CLINIC_OR_DEPARTMENT_OTHER): Payer: Self-pay | Admitting: Anesthesiology

## 2012-05-20 ENCOUNTER — Observation Stay (HOSPITAL_COMMUNITY): Payer: BC Managed Care – PPO

## 2012-05-20 ENCOUNTER — Encounter (HOSPITAL_BASED_OUTPATIENT_CLINIC_OR_DEPARTMENT_OTHER): Payer: Self-pay | Admitting: *Deleted

## 2012-05-20 DIAGNOSIS — Y834 Other reconstructive surgery as the cause of abnormal reaction of the patient, or of later complication, without mention of misadventure at the time of the procedure: Secondary | ICD-10-CM | POA: Diagnosis present

## 2012-05-20 DIAGNOSIS — Z8719 Personal history of other diseases of the digestive system: Secondary | ICD-10-CM

## 2012-05-20 DIAGNOSIS — Z8249 Family history of ischemic heart disease and other diseases of the circulatory system: Secondary | ICD-10-CM

## 2012-05-20 DIAGNOSIS — E785 Hyperlipidemia, unspecified: Secondary | ICD-10-CM | POA: Diagnosis present

## 2012-05-20 DIAGNOSIS — T8131XA Disruption of external operation (surgical) wound, not elsewhere classified, initial encounter: Secondary | ICD-10-CM

## 2012-05-20 DIAGNOSIS — Y92009 Unspecified place in unspecified non-institutional (private) residence as the place of occurrence of the external cause: Secondary | ICD-10-CM

## 2012-05-20 DIAGNOSIS — M1710 Unilateral primary osteoarthritis, unspecified knee: Secondary | ICD-10-CM

## 2012-05-20 DIAGNOSIS — M009 Pyogenic arthritis, unspecified: Secondary | ICD-10-CM | POA: Diagnosis present

## 2012-05-20 DIAGNOSIS — I739 Peripheral vascular disease, unspecified: Secondary | ICD-10-CM | POA: Diagnosis present

## 2012-05-20 DIAGNOSIS — E559 Vitamin D deficiency, unspecified: Secondary | ICD-10-CM | POA: Diagnosis present

## 2012-05-20 DIAGNOSIS — T8130XA Disruption of wound, unspecified, initial encounter: Principal | ICD-10-CM | POA: Diagnosis present

## 2012-05-20 DIAGNOSIS — Z79899 Other long term (current) drug therapy: Secondary | ICD-10-CM

## 2012-05-20 DIAGNOSIS — F411 Generalized anxiety disorder: Secondary | ICD-10-CM | POA: Diagnosis present

## 2012-05-20 DIAGNOSIS — M2340 Loose body in knee, unspecified knee: Secondary | ICD-10-CM | POA: Diagnosis present

## 2012-05-20 DIAGNOSIS — K219 Gastro-esophageal reflux disease without esophagitis: Secondary | ICD-10-CM | POA: Diagnosis present

## 2012-05-20 DIAGNOSIS — Z86718 Personal history of other venous thrombosis and embolism: Secondary | ICD-10-CM

## 2012-05-20 DIAGNOSIS — Z9089 Acquired absence of other organs: Secondary | ICD-10-CM

## 2012-05-20 HISTORY — PX: KNEE ARTHROSCOPY: SHX127

## 2012-05-20 LAB — CREATININE, SERUM
Creatinine, Ser: 0.6 mg/dL (ref 0.50–1.10)
GFR calc Af Amer: >90 mL/min (ref >90)
GFR calc non Af Amer: >90 mL/min (ref >90)

## 2012-05-20 LAB — CBC
HCT: 39.2 % (ref 36.0–46.0)
Hemoglobin: 12.7 g/dL (ref 12.0–15.0)
MCHC: 33.2 g/dL (ref 30.0–36.0)
MCV: 91.8 fL (ref 78.0–100.0)
RDW: 13.2 % (ref 11.5–15.5)
RDW: 13 % (ref 11.5–15.5)
WBC: 6.1 10*3/uL (ref 4.0–10.5)
WBC: 8 10*3/uL (ref 4.0–10.5)

## 2012-05-20 LAB — POCT I-STAT, CHEM 8
BUN: 14 mg/dL (ref 6–23)
Calcium, Ion: 1.23 mmol/L (ref 1.12–1.23)
Chloride: 111 mEq/L (ref 96–112)

## 2012-05-20 LAB — GRAM STAIN

## 2012-05-20 SURGERY — ARTHROSCOPY, KNEE
Anesthesia: General | Site: Knee | Laterality: Right | Wound class: Clean Contaminated

## 2012-05-20 MED ORDER — SODIUM CHLORIDE 0.9 % IV SOLN
INTRAVENOUS | Status: DC
  Filled 2012-05-20: qty 1000

## 2012-05-20 MED ORDER — DEXAMETHASONE SODIUM PHOSPHATE 4 MG/ML IJ SOLN
INTRAMUSCULAR | Status: DC | PRN
  Administered 2012-05-20: 10 mg via INTRAVENOUS

## 2012-05-20 MED ORDER — ATORVASTATIN CALCIUM 20 MG PO TABS
20.0000 mg | ORAL_TABLET | Freq: Every day | ORAL | Status: DC
  Administered 2012-05-21: 20 mg via ORAL
  Filled 2012-05-20 (×2): qty 1

## 2012-05-20 MED ORDER — PANTOPRAZOLE SODIUM 40 MG PO TBEC
40.0000 mg | DELAYED_RELEASE_TABLET | Freq: Every day | ORAL | Status: DC
  Administered 2012-05-21: 40 mg via ORAL
  Filled 2012-05-20 (×2): qty 1

## 2012-05-20 MED ORDER — METOCLOPRAMIDE HCL 5 MG PO TABS
5.0000 mg | ORAL_TABLET | Freq: Three times a day (TID) | ORAL | Status: DC | PRN
  Filled 2012-05-20: qty 2

## 2012-05-20 MED ORDER — FENTANYL CITRATE 0.05 MG/ML IJ SOLN
INTRAMUSCULAR | Status: DC | PRN
  Administered 2012-05-20 (×3): 50 ug via INTRAVENOUS
  Administered 2012-05-20: 100 ug via INTRAVENOUS
  Administered 2012-05-20: 50 ug via INTRAVENOUS

## 2012-05-20 MED ORDER — FLUTICASONE FUROATE 27.5 MCG/SPRAY NA SUSP: 2.0000 | Freq: Every day | NASAL | Status: DC

## 2012-05-20 MED ORDER — DEXTROSE-NACL 5-0.45 % IV SOLN
INTRAVENOUS | Status: DC
  Administered 2012-05-21: 02:00:00 via INTRAVENOUS
  Filled 2012-05-20: qty 1000

## 2012-05-20 MED ORDER — DEXTROSE 5 % IV SOLN
2.0000 g | Freq: Once | INTRAVENOUS | Status: AC
  Administered 2012-05-20: 2 g via INTRAVENOUS
  Filled 2012-05-20: qty 2

## 2012-05-20 MED ORDER — MIDAZOLAM HCL 5 MG/5ML IJ SOLN
INTRAMUSCULAR | Status: DC | PRN
  Administered 2012-05-20: 2 mg via INTRAVENOUS

## 2012-05-20 MED ORDER — VANCOMYCIN HCL IN DEXTROSE 1-5 GM/200ML-% IV SOLN
1000.0000 mg | INTRAVENOUS | Status: AC
  Administered 2012-05-20: 1000 mg via INTRAVENOUS
  Filled 2012-05-20: qty 200

## 2012-05-20 MED ORDER — ACETAMINOPHEN 10 MG/ML IV SOLN
INTRAVENOUS | Status: DC | PRN
  Administered 2012-05-20: 1000 mg via INTRAVENOUS

## 2012-05-20 MED ORDER — MEPERIDINE HCL 25 MG/ML IJ SOLN
6.2500 mg | INTRAMUSCULAR | Status: DC | PRN
  Filled 2012-05-20: qty 1

## 2012-05-20 MED ORDER — SODIUM CHLORIDE 0.9 % IR SOLN
Status: DC | PRN
  Administered 2012-05-20: 6000 mL

## 2012-05-20 MED ORDER — BUPIVACAINE-EPINEPHRINE 0.5% -1:200000 IJ SOLN: INTRAMUSCULAR | Status: DC | PRN

## 2012-05-20 MED ORDER — ONDANSETRON HCL 4 MG/2ML IJ SOLN
4.0000 mg | Freq: Four times a day (QID) | INTRAMUSCULAR | Status: DC | PRN
  Administered 2012-05-20: 4 mg via INTRAVENOUS
  Filled 2012-05-20 (×2): qty 2

## 2012-05-20 MED ORDER — METOCLOPRAMIDE HCL 5 MG/ML IJ SOLN
5.0000 mg | Freq: Three times a day (TID) | INTRAMUSCULAR | Status: DC | PRN
  Administered 2012-05-20 – 2012-05-21 (×2): 10 mg via INTRAVENOUS
  Filled 2012-05-20 (×3): qty 2

## 2012-05-20 MED ORDER — EPINEPHRINE HCL 1 MG/ML IJ SOLN
INTRAMUSCULAR | Status: DC | PRN
  Administered 2012-05-20: 2 mg

## 2012-05-20 MED ORDER — ONDANSETRON HCL 4 MG PO TABS
4.0000 mg | ORAL_TABLET | Freq: Four times a day (QID) | ORAL | Status: DC | PRN
  Administered 2012-05-21: 4 mg via ORAL
  Filled 2012-05-20 (×2): qty 1

## 2012-05-20 MED ORDER — HYDROMORPHONE HCL PF 1 MG/ML IJ SOLN
0.5000 mg | INTRAMUSCULAR | Status: DC | PRN
  Administered 2012-05-20 – 2012-05-21 (×2): 1 mg via INTRAVENOUS
  Filled 2012-05-20 (×3): qty 1

## 2012-05-20 MED ORDER — ENOXAPARIN SODIUM 40 MG/0.4ML ~~LOC~~ SOLN
40.0000 mg | SUBCUTANEOUS | Status: DC
  Administered 2012-05-21: 40 mg via SUBCUTANEOUS
  Filled 2012-05-20 (×3): qty 0.4

## 2012-05-20 MED ORDER — ONDANSETRON HCL 4 MG/2ML IJ SOLN
INTRAMUSCULAR | Status: DC | PRN
  Administered 2012-05-20: 4 mg via INTRAVENOUS

## 2012-05-20 MED ORDER — OXYCODONE-ACETAMINOPHEN 5-325 MG PO TABS
1.0000 | ORAL_TABLET | ORAL | Status: DC | PRN
  Administered 2012-05-20 – 2012-05-21 (×4): 2 via ORAL
  Filled 2012-05-20 (×5): qty 2

## 2012-05-20 MED ORDER — METHOCARBAMOL 500 MG PO TABS
500.0000 mg | ORAL_TABLET | Freq: Four times a day (QID) | ORAL | Status: DC | PRN
  Administered 2012-05-20 – 2012-05-21 (×3): 500 mg via ORAL
  Filled 2012-05-20 (×3): qty 1

## 2012-05-20 MED ORDER — ACETAMINOPHEN 10 MG/ML IV SOLN
1000.0000 mg | Freq: Once | INTRAVENOUS | Status: DC | PRN
  Filled 2012-05-20: qty 100

## 2012-05-20 MED ORDER — SODIUM CHLORIDE 0.9 % IJ SOLN: 10.0000 mL | INTRAMUSCULAR | Status: DC | PRN

## 2012-05-20 MED ORDER — OXYCODONE HCL 5 MG/5ML PO SOLN
5.0000 mg | Freq: Once | ORAL | Status: AC | PRN
  Filled 2012-05-20: qty 5

## 2012-05-20 MED ORDER — PROMETHAZINE HCL 25 MG/ML IJ SOLN
6.2500 mg | INTRAMUSCULAR | Status: DC | PRN
  Filled 2012-05-20: qty 1

## 2012-05-20 MED ORDER — LACTATED RINGERS IV SOLN
INTRAVENOUS | Status: DC
  Administered 2012-05-20 (×3): via INTRAVENOUS
  Filled 2012-05-20: qty 1000

## 2012-05-20 MED ORDER — HYDROMORPHONE HCL PF 1 MG/ML IJ SOLN
0.2500 mg | INTRAMUSCULAR | Status: DC | PRN
  Administered 2012-05-20 (×2): 0.5 mg via INTRAVENOUS
  Filled 2012-05-20: qty 1

## 2012-05-20 MED ORDER — CHLORHEXIDINE GLUCONATE 4 % EX LIQD
60.0000 mL | Freq: Once | CUTANEOUS | Status: DC
  Filled 2012-05-20: qty 60

## 2012-05-20 MED ORDER — OXYCODONE HCL 5 MG PO TABS
5.0000 mg | ORAL_TABLET | Freq: Once | ORAL | Status: AC | PRN
  Administered 2012-05-20: 5 mg via ORAL
  Filled 2012-05-20: qty 1

## 2012-05-20 MED ORDER — LIDOCAINE HCL (CARDIAC) 20 MG/ML IV SOLN
INTRAVENOUS | Status: DC | PRN
  Administered 2012-05-20: 80 mg via INTRAVENOUS

## 2012-05-20 MED ORDER — ASPIRIN 325 MG PO TABS
325.0000 mg | ORAL_TABLET | Freq: Every day | ORAL | Status: DC
  Administered 2012-05-21: 325 mg via ORAL
  Filled 2012-05-20 (×2): qty 1

## 2012-05-20 MED ORDER — METHOCARBAMOL 100 MG/ML IJ SOLN
500.0000 mg | Freq: Four times a day (QID) | INTRAVENOUS | Status: DC | PRN
  Filled 2012-05-20: qty 5

## 2012-05-20 MED ORDER — OXYCODONE-ACETAMINOPHEN 5-325 MG PO TABS
1.0000 | ORAL_TABLET | ORAL | Status: DC | PRN
  Filled 2012-05-20: qty 2

## 2012-05-20 MED ORDER — FLUTICASONE PROPIONATE 50 MCG/ACT NA SUSP
1.0000 | Freq: Every day | NASAL | Status: DC
  Filled 2012-05-20: qty 16

## 2012-05-20 MED ORDER — PROPOFOL 10 MG/ML IV BOLUS
INTRAVENOUS | Status: DC | PRN
  Administered 2012-05-20: 200 mg via INTRAVENOUS

## 2012-05-20 SURGICAL SUPPLY — 46 items
BANDAGE ELASTIC 6 VELCRO ST LF (GAUZE/BANDAGES/DRESSINGS) ×2 IMPLANT
BLADE 4.2CUDA (BLADE) IMPLANT
BLADE CUDA SHAVER 3.5 (BLADE) ×2 IMPLANT
BLADE GREAT WHITE 4.2 (BLADE) IMPLANT
BOOTIES KNEE HIGH SLOAN (MISCELLANEOUS) ×2 IMPLANT
CANISTER SUCT LVC 12 LTR MEDI- (MISCELLANEOUS) ×2 IMPLANT
CANISTER SUCTION 1200CC (MISCELLANEOUS) IMPLANT
CANISTER SUCTION 2500CC (MISCELLANEOUS) IMPLANT
CANNULA ACUFLEX KIT 5X76 (CANNULA) IMPLANT
CHLORAPREP W/TINT 26ML (MISCELLANEOUS) ×1 IMPLANT
CLOTH BEACON ORANGE TIMEOUT ST (SAFETY) ×2 IMPLANT
CONT SPEC 4OZ CLIKSEAL STRL BL (MISCELLANEOUS) ×1 IMPLANT
CUTTER MENISCUS  4.2MM (BLADE)
CUTTER MENISCUS 4.2MM (BLADE) IMPLANT
DRAPE ARTHROSCOPY W/POUCH 114 (DRAPES) ×2 IMPLANT
DRSG PAD ABDOMINAL 8X10 ST (GAUZE/BANDAGES/DRESSINGS) ×1 IMPLANT
DURAPREP 26ML APPLICATOR (WOUND CARE) ×1 IMPLANT
ELECT REM PT RETURN 9FT ADLT (ELECTROSURGICAL)
ELECTRODE REM PT RTRN 9FT ADLT (ELECTROSURGICAL) IMPLANT
EVACUATOR 1/8 PVC DRAIN (DRAIN) ×1 IMPLANT
GLOVE SURG SS PI 8.0 STRL IVOR (GLOVE) ×2 IMPLANT
GOWN PREVENTION PLUS LG XLONG (DISPOSABLE) ×2 IMPLANT
GOWN STRL REIN XL XLG (GOWN DISPOSABLE) ×2 IMPLANT
IV NS IRRIG 3000ML ARTHROMATIC (IV SOLUTION) ×5 IMPLANT
KNEE WRAP E Z 3 GEL PACK (MISCELLANEOUS) ×2 IMPLANT
MINI VAC (SURGICAL WAND) IMPLANT
NDL FILTER BLUNT 18X1 1/2 (NEEDLE) ×1 IMPLANT
NDL SAFETY ECLIPSE 18X1.5 (NEEDLE) ×2 IMPLANT
NEEDLE FILTER BLUNT 18X 1/2SAF (NEEDLE) ×1
NEEDLE FILTER BLUNT 18X1 1/2 (NEEDLE) ×1 IMPLANT
NEEDLE HYPO 18GX1.5 SHARP (NEEDLE) ×4
PACK ARTHROSCOPY DSU (CUSTOM PROCEDURE TRAY) ×2 IMPLANT
PACK BASIN DAY SURGERY FS (CUSTOM PROCEDURE TRAY) ×2 IMPLANT
PADDING CAST COTTON 6X4 STRL (CAST SUPPLIES) ×2 IMPLANT
RESECTOR FULL RADIUS 4.2MM (BLADE) IMPLANT
SET ARTHROSCOPY TUBING (MISCELLANEOUS) ×2
SET ARTHROSCOPY TUBING LN (MISCELLANEOUS) ×1 IMPLANT
SPONGE GAUZE 4X4 12PLY (GAUZE/BANDAGES/DRESSINGS) ×2 IMPLANT
STRIP CLOSURE SKIN 1/2X4 (GAUZE/BANDAGES/DRESSINGS) ×1 IMPLANT
SUT ETHILON 4 0 PS 2 18 (SUTURE) ×2 IMPLANT
SYR 30ML LL (SYRINGE) ×2 IMPLANT
SYR 50ML LL SCALE MARK (SYRINGE) ×1 IMPLANT
SYRINGE 10CC LL (SYRINGE) ×2 IMPLANT
TOWEL OR 17X24 6PK STRL BLUE (TOWEL DISPOSABLE) ×2 IMPLANT
WAND 90 DEG TURBOVAC W/CORD (SURGICAL WAND) ×1 IMPLANT
WATER STERILE IRR 500ML POUR (IV SOLUTION) ×2 IMPLANT

## 2012-05-20 NOTE — H&P (View-Only) (Signed)
Julia Murray is an 57 y.o. female.   Chief Complaint: right knee pain HPI: Chronic DJD, ACL deficiency refractory  Past Medical History  Diagnosis Date  . Anxiety   . Hyperlipidemia   . Dependent edema   . Perimenopausal   . Vitamin D deficiency disease   . GERD (gastroesophageal reflux disease)   . Colitis 11/11    hx gi bleed ; no colitis since  . History of blood transfusion 11/11     d/t acute colitis  . Deep vein blood clot of left lower extremity     saw Dr. Early 12/17 , has clot behind l knee currently    Past Surgical History  Procedure Date  . Anterior cruciate ligament repair     left  . Tonsillectomy   . Breast surgery     mutiple bx    Family History  Problem Relation Age of Onset  . Heart disease Father   . Heart attack Father   . Diabetes Mother    Social History:  reports that she has never smoked. She has never used smokeless tobacco. She reports that she drinks about 3 ounces of alcohol per week. She reports that she does not use illicit drugs.  Allergies:  Allergies  Allergen Reactions  . Shellfish Allergy Hives    Pt States betadine ok    Medications Prior to Admission  Medication Sig Dispense Refill  . atorvastatin (LIPITOR) 20 MG tablet Take 1 tablet (20 mg total) by mouth daily.  90 tablet  1  . esomeprazole (NEXIUM) 40 MG capsule Take 1 capsule (40 mg total) by mouth daily before breakfast.  90 capsule  1  . estradiol (ESTRACE) 0.5 MG tablet Take 0.5 mg by mouth daily.      . fluticasone (FLONASE) 50 MCG/ACT nasal spray USE 2 SPRAYS IN EACH NOSTRIL EVERY MORNING  16 g  11  . fluticasone (VERAMYST) 27.5 MCG/SPRAY nasal spray Place 2 sprays into the nose daily.       . ibuprofen (ADVIL,MOTRIN) 800 MG tablet Take 1 tablet by mouth as needed.      . Estradiol-Norethindrone Acet 0.5-0.1 MG per tablet         Results for orders placed during the hospital encounter of 04/25/12 (from the past 48 hour(s))  POCT HEMOGLOBIN-HEMACUE     Status:  Abnormal   Collection Time   04/25/12 11:28 AM      Component Value Range Comment   Hemoglobin 10.4 (*) 12.0 - 15.0 g/dL    No results found.  Review of Systems  Musculoskeletal: Positive for joint pain.    Blood pressure 125/79, pulse 73, temperature 97.8 F (36.6 C), temperature source Oral, resp. rate 18, height 5' 6" (1.676 m), weight 81.194 kg (179 lb), SpO2 99.00%. Physical Exam  Constitutional: She is oriented to person, place, and time. She appears well-developed.  HENT:  Head: Normocephalic.  Eyes: Pupils are equal, round, and reactive to light.  Neck: Normal range of motion.  Cardiovascular: Normal rate.   GI: Soft.  Musculoskeletal:       MJLT, LJLT, +effusion Sl ant drawer no dvt.  Neurological: She is alert and oriented to person, place, and time.  Skin: Skin is warm and dry.   MRI loose bodys 1.5cm, ACL chronic tear DJD some Grade 4  Assessment/Plan Right knee pain mechanical Sx. DJD loose bodies, ACL chronic. Plan arthroscopy debridement eval. Risks discussed  Talen Poser C 04/25/2012, 2:03 PM    

## 2012-05-20 NOTE — Anesthesia Procedure Notes (Signed)
Procedure Name: LMA Insertion Date/Time: 05/20/2012 1:11 PM Performed by: Maris Berger T Pre-anesthesia Checklist: Patient identified, Emergency Drugs available, Suction available and Patient being monitored Patient Re-evaluated:Patient Re-evaluated prior to inductionOxygen Delivery Method: Circle System Utilized Preoxygenation: Pre-oxygenation with 100% oxygen Intubation Type: IV induction Ventilation: Mask ventilation without difficulty LMA: LMA with gastric port inserted LMA Size: 4.0 Number of attempts: 1 Placement Confirmation: positive ETCO2 Tube secured with: Tape Dental Injury: Teeth and Oropharynx as per pre-operative assessment

## 2012-05-20 NOTE — Anesthesia Postprocedure Evaluation (Signed)
  Anesthesia Post-op Note  Patient: Julia Murray  Procedure(s) Performed: Procedure(s) (LRB): ARTHROSCOPY KNEE (Right)  Patient Location: PACU  Anesthesia Type: General  Level of Consciousness: awake and alert   Airway and Oxygen Therapy: Patient Spontanous Breathing  Post-op Pain: mild  Post-op Assessment: Post-op Vital signs reviewed, Patient's Cardiovascular Status Stable, Respiratory Function Stable, Patent Airway and No signs of Nausea or vomiting  Last Vitals:  Filed Vitals:   05/20/12 1415  BP: 117/75  Pulse: 94  Temp:   Resp: 13    Post-op Vital Signs: stable   Complications: No apparent anesthesia complications

## 2012-05-20 NOTE — Anesthesia Preprocedure Evaluation (Addendum)
Anesthesia Evaluation  Patient identified by MRN, date of birth, ID band Patient awake    Reviewed: Allergy & Precautions, H&P , NPO status , Patient's Chart, lab work & pertinent test results, reviewed documented beta blocker date and time   Airway Mallampati: II TM Distance: >3 FB Neck ROM: full    Dental No notable dental hx. (+) Dental Advisory Given   Pulmonary neg pulmonary ROS,  breath sounds clear to auscultation  Pulmonary exam normal       Cardiovascular Exercise Tolerance: Good + Peripheral Vascular Disease Rhythm:regular Rate:Normal  Evaluated by Dr. Arbie Cookey 04/22/12 for chronic swelling of left leg and diagnosed with chronic, but not acute DVT. Dr. Shelle Iron aware.   Neuro/Psych negative neurological ROS  negative psych ROS   GI/Hepatic negative GI ROS, Neg liver ROS, GERD-  Medicated,  Endo/Other  negative endocrine ROS  Renal/GU negative Renal ROS  negative genitourinary   Musculoskeletal   Abdominal   Peds  Hematology negative hematology ROS (+)   Anesthesia Other Findings   Reproductive/Obstetrics negative OB ROS                           Anesthesia Physical  Anesthesia Plan  ASA: II  Anesthesia Plan: General   Post-op Pain Management:    Induction: Intravenous  Airway Management Planned: LMA  Additional Equipment:   Intra-op Plan:   Post-operative Plan: Extubation in OR  Informed Consent: I have reviewed the patients History and Physical, chart, labs and discussed the procedure including the risks, benefits and alternatives for the proposed anesthesia with the patient or authorized representative who has indicated his/her understanding and acceptance.   Dental advisory given  Plan Discussed with: CRNA  Anesthesia Plan Comments: (Discussed with Dr. Shelle Iron the issue with high dose NSAIDs the pt took this AM. He said no reason for delaying surgery from his part. )        Anesthesia Quick Evaluation                                  Anesthesia Evaluation  Patient identified by MRN, date of birth, ID band Patient awake    Reviewed: Allergy & Precautions, H&P , NPO status , Patient's Chart, lab work & pertinent test results, reviewed documented beta blocker date and time   Airway Mallampati: II TM Distance: >3 FB Neck ROM: full    Dental No notable dental hx.    Pulmonary neg pulmonary ROS,  breath sounds clear to auscultation  Pulmonary exam normal       Cardiovascular Exercise Tolerance: Good + Peripheral Vascular Disease Rhythm:regular Rate:Normal  Evaluated by Dr. Arbie Cookey 04/22/12 for chronic swelling of left leg and diagnosed with chronic, but not acute DVT. Dr. Shelle Iron aware.   Neuro/Psych negative neurological ROS  negative psych ROS   GI/Hepatic negative GI ROS, Neg liver ROS, GERD-  Medicated,  Endo/Other  negative endocrine ROS  Renal/GU negative Renal ROS  negative genitourinary   Musculoskeletal   Abdominal   Peds  Hematology negative hematology ROS (+)   Anesthesia Other Findings   Reproductive/Obstetrics negative OB ROS                          Anesthesia Physical Anesthesia Plan  ASA: II  Anesthesia Plan: General   Post-op Pain Management:    Induction:   Airway Management  Planned: LMA  Additional Equipment:   Intra-op Plan:   Post-operative Plan:   Informed Consent: I have reviewed the patients History and Physical, chart, labs and discussed the procedure including the risks, benefits and alternatives for the proposed anesthesia with the patient or authorized representative who has indicated his/her understanding and acceptance.   Dental Advisory Given  Plan Discussed with: CRNA  Anesthesia Plan Comments:         Anesthesia Quick Evaluation

## 2012-05-20 NOTE — Interval H&P Note (Signed)
History and Physical Interval Note:  05/20/2012 12:42 PM  Julia Murray  has presented today for surgery, with the diagnosis of right knee drainage   The various methods of treatment have been discussed with the patient and family. After consideration of risks, benefits and other options for treatment, the patient has consented to  Procedure(s) (LRB) with comments: ARTHROSCOPY KNEE (Right) - right knee scope with i&d as a surgical intervention .  The patient's history has been reviewed, patient examined, change in status, stable for surgery.  I have reviewed the patient's chart and labs.  Questions were answered to the patient's satisfaction.  Patient 31/2 weeks from arthroscopy with drainage clear and severe knee pain. Minimal  Redness at medial portal. Some pain with ROM knee. S/P I and D in office. Stat gm stain  No orgs. Plan I and D culture IV AB until cultures avail. Discussed with patient.   Julia Murray C

## 2012-05-20 NOTE — Consult Note (Signed)
[facility]     Reason for Consult: Septic arthritis s/p arthroscopic surgery    Referring Physician: Dr. Shelle Iron  Principal Problem:  *DJD (degenerative joint disease) of knee      . aspirin  325 mg Oral Daily  . atorvastatin  20 mg Oral Daily  . enoxaparin (LOVENOX) injection  40 mg Subcutaneous Q24H  . fluticasone  2 spray Nasal Daily  . pantoprazole  40 mg Oral Daily    Recommendations: Continue vanocmycin and ceftriaxone per home health protocol, pending any new culture data -we will follow up with the patient at [facility] in 3 to 4 days to monitor the culture and adjust antibiotics as needed and monitor for any side effects -plan on at least 2 weeks IV (through 1/27) and follow up with po therapy   Assessment: Septic arthritis after surgery   Antibiotics: Vancomycin and ceftriaxone  HPI: Julia Murray is a 57 y.o. female with right knee pain and DJD with loose bodies and chronic ACL s/p arthroscopic debridement on 12/20 presented with acute pain in right knee Sunday am about 3am, that woke patient up from sleep.  She noted some clear drainage and swelling.  She called Dr. Shelle Iron the following day and advised to come in for evaluation.  Today, she underwent incision and drainage and noted to have abundant WBC on gram stain.  No fever or chills.     Thanks for the consult  Review of Systems: Pertinent items are noted in HPI.  Past Medical History  Diagnosis Date  . Anxiety   . Hyperlipidemia   . Dependent edema   . Perimenopausal   . Vitamin D deficiency disease   . GERD (gastroesophageal reflux disease)   . Colitis 11/11    hx gi bleed ; no colitis since  . History of blood transfusion 11/11     d/t acute colitis  . Deep vein blood clot of left lower extremity     saw Dr. Arbie Cookey 12/17 , has clot behind l knee currently    History  Substance Use Topics  . Smoking status: Never Smoker   . Smokeless tobacco: Never Used  . Alcohol  Use: 3.0 oz/week    5 Shots of liquor per week    Family History  Problem Relation Age of Onset  . Heart disease Father   . Heart attack Father   . Diabetes Mother    Allergies  Allergen Reactions  . Shellfish Allergy Hives    Pt States betadine ok    OBJECTIVE: Blood pressure 143/83, pulse 60, temperature 97.5 F (36.4 C), temperature source Oral, resp. rate 15, height 5\' 5"  (1.651 m), weight 177 lb (80.287 kg), SpO2 100.00%. General: Awake, alert, nad Skin: no rashes Lungs: CTA B Cor: RRR without m/r/g Knee wrapped  Microbiology: Recent Results (from the past 240 hour(s))  GRAM STAIN     Status: Normal   Collection Time   05/20/12  1:19 PM      Component Value Range Status Comment   Specimen Description WOUND LEG RIGHT   Final    Special Requests PATIENT ON FOLLOWING VANCOMYCIN AND ROCHEPIN   Final    Gram Stain     Final    Value: FEW WBC PRESENT,BOTH PMN AND MONONUCLEAR     NO ORGANISMS SEEN     Gram Stain Report Called to,Read Back By and Verified With: [id number]   Report Status 05/20/2012 FINAL   Final   Romie Minus  STAIN     Status: Normal   Collection Time   05/20/12  1:23 PM      Component Value Range Status Comment   Specimen Description FLUID KNEE RIGHT   Final    Special Requests PATIENT ON FOLLOWING VANCOMYCIN AND ROCEPHIN   Final    Gram Stain     Final    Value: ABUNDANT WBC PRESENT,BOTH PMN AND MONONUCLEAR     NO ORGANISMS SEEN     Gram Stain Report Called to,Read Back By and Verified With: FARFO,[id number]   Report Status 05/20/2012 FINAL   Final     Staci Righter, MD [facility] [phone] pager  [phone] cell 05/20/2012, 4:44 PM

## 2012-05-20 NOTE — Transfer of Care (Signed)
Immediate Anesthesia Transfer of Care Note  Patient: Julia Murray  Procedure(s) Performed: Procedure(s) (LRB) with comments: ARTHROSCOPY KNEE (Right) - right knee scope with incision and drainage and removal of loose foreign body   Patient Location: PACU  Anesthesia Type:General  Level of Consciousness: awake, alert  and oriented  Airway & Oxygen Therapy: Patient Spontanous Breathing and Patient connected to nasal cannula oxygen  Post-op Assessment: Report given to PACU RN  Post vital signs: Reviewed and stable  Complications: No apparent anesthesia complications

## 2012-05-20 NOTE — Progress Notes (Signed)
Pt states no change in health assessment since last admission x knee infection.  Ate tomato soup @ 0600 4 tblsp. Dr. Renold Don informed.

## 2012-05-20 NOTE — Progress Notes (Signed)
Peripherally Inserted Central Catheter/Midline Placement  The IV Nurse has discussed with the patient and/or persons authorized to consent for the patient, the purpose of this procedure and the potential benefits and risks involved with this procedure.  The benefits include less needle sticks, lab draws from the catheter and patient may be discharged home with the catheter.  Risks include, but not limited to, infection, bleeding, blood clot (thrombus formation), and puncture of an artery; nerve damage and irregular heat beat.  Alternatives to this procedure were also discussed.  PICC/Midline Placement Documentation  PICC / Midline Single Lumen 05/20/12 PICC Right Brachial (Active)       Christeen Douglas 05/20/2012, 11:18 PM

## 2012-05-20 NOTE — Brief Op Note (Signed)
05/20/2012  1:57 PM  PATIENT:  Julia Murray  57 y.o. female  PRE-OPERATIVE DIAGNOSIS:  right knee drainage status post prior knee arthroscopy  POST-OPERATIVE DIAGNOSIS:  right knee drainage status post prior knee arthroscopy  PROCEDURE:  Procedure(s) (LRB) with comments: ARTHROSCOPY KNEE (Right) - right knee scope with incision and drainage and removal of loose foreign body   SURGEON:  Surgeon(s) and Role:    * Javier Docker, MD - Primary  PHYSICIAN ASSISTANT:   ASSISTANTS: none   ANESTHESIA:   general  EBL:     BLOOD ADMINISTERED:none  DRAINS: (1) Hemovact drain(s) in the 1 with  Suction Open   LOCAL MEDICATIONS USED:  NONE  SPECIMEN:  Source of Specimen:  Joint fluid  DISPOSITION OF SPECIMEN:  PATHOLOGY  COUNTS:  YES  TOURNIQUET:  * No tourniquets in log *  DICTATION: .Other Dictation: Dictation Number [id number]  PLAN OF CARE: Admit for overnight observation  PATIENT DISPOSITION:  PACU - hemodynamically stable.   Delay start of Pharmacological VTE agent (>24hrs) due to surgical blood loss or risk of bleeding: no

## 2012-05-21 ENCOUNTER — Encounter (HOSPITAL_BASED_OUTPATIENT_CLINIC_OR_DEPARTMENT_OTHER): Payer: Self-pay | Admitting: Specialist

## 2012-05-21 LAB — COMPREHENSIVE METABOLIC PANEL
AST: 13 U/L (ref 0–37)
Albumin: 2.9 g/dL — ABNORMAL LOW (ref 3.5–5.2)
BUN: 8 mg/dL (ref 6–23)
Creatinine, Ser: 0.45 mg/dL — ABNORMAL LOW (ref 0.50–1.10)
Potassium: 4 mEq/L (ref 3.5–5.1)
Total Protein: 5.8 g/dL — ABNORMAL LOW (ref 6.0–8.3)

## 2012-05-21 MED ORDER — VANCOMYCIN HCL 10 G IV SOLR: 1250.0000 mg | Freq: Two times a day (BID) | INTRAVENOUS | Status: AC

## 2012-05-21 MED ORDER — SODIUM CHLORIDE 0.9 % IV SOLN: 1250.0000 mg | Freq: Two times a day (BID) | INTRAVENOUS | Status: DC

## 2012-05-21 MED ORDER — DEXTROSE 5 % IV SOLN: 2.0000 g | INTRAVENOUS | Status: AC

## 2012-05-21 MED ORDER — VANCOMYCIN HCL 10 G IV SOLR
1250.0000 mg | Freq: Two times a day (BID) | INTRAVENOUS | Status: DC
  Administered 2012-05-21: 1250 mg via INTRAVENOUS
  Filled 2012-05-21: qty 1250

## 2012-05-21 MED ORDER — DOCUSATE SODIUM 100 MG PO CAPS: 100.0000 mg | ORAL_CAPSULE | Freq: Every day | ORAL | Status: DC

## 2012-05-21 MED ORDER — OXYCODONE-ACETAMINOPHEN 5-325 MG PO TABS: 1.0000 | ORAL_TABLET | ORAL | Status: DC | PRN

## 2012-05-21 MED ORDER — FLUTICASONE FUROATE 27.5 MCG/SPRAY NA SUSP: 2.0000 | Freq: Every day | NASAL | Status: DC

## 2012-05-21 MED ORDER — METHOCARBAMOL 500 MG PO TABS: 500.0000 mg | ORAL_TABLET | Freq: Three times a day (TID) | ORAL | Status: DC | PRN

## 2012-05-21 MED ORDER — FLUTICASONE PROPIONATE 50 MCG/ACT NA SUSP
2.0000 | Freq: Every day | NASAL | Status: DC
  Administered 2012-05-21: 2 via NASAL
  Filled 2012-05-21: qty 16

## 2012-05-21 MED ORDER — DEXTROSE 5 % IV SOLN
2.0000 g | INTRAVENOUS | Status: DC
  Administered 2012-05-21: 2 g via INTRAVENOUS
  Filled 2012-05-21: qty 2

## 2012-05-21 NOTE — Care Management Note (Signed)
    Page 1 of 2   05/21/2012     7:11:00 PM   CARE MANAGEMENT NOTE 05/21/2012  Patient:  Julia Murray, Julia Murray   Account Number:  [id number]  Date Initiated:  05/21/2012  Documentation initiated by:  Colleen Can  Subjective/Objective Assessment:   dx rt knee infection     Action/Plan:   Home with home IV abx  Pt lives in [place] with spouse. Plans are for both pt and spouse to leran to administer home IV antibiotics. They want hh agency in network   Anticipated DC Date:  05/21/2012   Anticipated DC Plan:  HOME W HOME HEALTH SERVICES  In-house referral  NA      DC Planning Services  CM consult      [facility] Choice  HOME HEALTH   Choice offered to / List presented to:  C-1 Patient   DME arranged  IV PUMP/EQUIPMENT      DME agency  [facility]     [facility] arranged  HH-1 RN  IV Antibiotics      HH agency  [facility]   Status of service:  Completed, signed off Medicare Important Message given?  NO (If response is "NO", the following Medicare IM given date fields will be blank) Date Medicare IM given:   Date Additional Medicare IM given:    Discharge Disposition:  HOME W HOME HEALTH SERVICES  Per UR Regulation:  Reviewed for med. necessity/level of care/duration of stay  If discussed at Long Length of Stay Meetings, dates discussed:    Comments:  05/21/2012 Colleen Can BSN RN CCM [phone] [facility] can provide [facility] RN for home iv abx. Will start services today upon discharge.

## 2012-05-21 NOTE — Progress Notes (Signed)
Patients cultures remain negative.  She will be discharged on vancomycin Iv and Ceftriaxone 2 grams daily for at least 2 weeks, per home health protocol.    She will follow up with [place] in a few days to check antibiotics and follow up on cultures.  Patient given information of our clinic and will be called.

## 2012-05-21 NOTE — Evaluation (Signed)
Physical Therapy Evaluation Patient Details Name: Julia Murray MRN: [id number] DOB: 03/29/1956 Today's Date: 05/21/2012 Time: 1240-1300 PT Time Calculation (min): 20 min  PT Assessment / Plan / Recommendation Clinical Impression  Pt. admitted 05/20/12 for  septic R knee following arthroscopic surgery in 12/13. Pt. s/p Iand D 1/14. Pt. is modified Independent w/ RW, has all DME and caregivers. No further PT indicated.    PT Assessment  Patent does not need any further PT services    Follow Up Recommendations  No PT follow up    Does the patient have the potential to tolerate intense rehabilitation      Barriers to Discharge        Equipment Recommendations  None recommended by PT    Recommendations for Other Services     Frequency      Precautions / Restrictions     Pertinent Vitals/Pain No pain. Ice applied.      Mobility  Bed Mobility Bed Mobility: Supine to Sit;Sit to Supine Supine to Sit: 7: Independent Sit to Supine: 7: Independent Transfers Transfers: Sit to Stand;Stand to Sit Sit to Stand: 6: Modified independent (Device/Increase time);From bed;From chair/3-in-1;With armrests;With upper extremity assist Stand to Sit: 6: Modified independent (Device/Increase time);With upper extremity assist;To bed;To chair/3-in-1 Ambulation/Gait Ambulation/Gait Assistance: 6: Modified independent (Device/Increase time) Ambulation Distance (Feet): 20 Feet (x2) Assistive device: Rolling walker Ambulation/Gait Assistance Details: pt is observed to be mod. I w/ RW Gait Pattern: Step-to pattern;Decreased stance time - right;Antalgic;Trunk flexed    Shoulder Instructions     Exercises Total Joint Exercises Quad Sets: AROM;Right;10 reps Straight Leg Raises: AROM;Right;10 reps   PT Diagnosis:    PT Problem List:   PT Treatment Interventions:     PT Goals    Visit Information  Last PT Received On: 05/21/12 Assistance Needed: +1    Subjective Data  Subjective: I have  been through similar Patient Stated Goal: to go home   Prior Functioning  Home Living Lives With: Spouse Available Help at Discharge: Family Type of Home: House Home Access: Stairs to enter Secretary/administrator of Steps: 3 Entrance Stairs-Rails: Right Home Layout: Two level;Able to live on main level with bedroom/bathroom Bathroom Toilet: Standard Home Adaptive Equipment: Bedside commode/3-in-1;Walker - rolling;Crutches Prior Function Level of Independence: Independent Able to Take Stairs?: Yes    Cognition  Overall Cognitive Status: Appears within functional limits for tasks assessed/performed    Extremity/Trunk Assessment Right Upper Extremity Assessment RUE ROM/Strength/Tone: [facility] for tasks assessed Left Upper Extremity Assessment LUE ROM/Strength/Tone: WFL for tasks assessed Right Lower Extremity Assessment RLE ROM/Strength/Tone: Deficits RLE ROM/Strength/Tone Deficits: able to rais Leg from bead bear weight, flexes knee to about 80se  Left Lower Extremity Assessment LLE ROM/Strength/Tone: [facility] for tasks assessed   Balance    End of Session PT - End of Session Activity Tolerance: Patient tolerated treatment well Patient left: in bed;with call bell/phone within reach Nurse Communication: Mobility status  GP     Rada Hay 05/21/2012, 4:51 PM

## 2012-05-21 NOTE — Progress Notes (Signed)
ANTIBIOTIC CONSULT NOTE - INITIAL  Pharmacy Consult for Vancomycin Indication: Septic arthritis, R knee  Allergies  Allergen Reactions  . Shellfish Allergy Hives    Pt States betadine ok   Patient Measurements: Height: 5\' 5"  (165.1 cm) Weight: 177 lb (80.287 kg) IBW/kg (Calculated) : 57   Vital Signs: Temp: 98 F (36.7 C) (01/15 1000) Temp src: Oral (01/15 1000) BP: 97/62 mmHg (01/15 1000) Pulse Rate: 70  (01/15 1000) Intake/Output from previous day: 01/14 0701 - 01/15 0700 In: 2957.5 [P.O.:840; I.V.:2117.5] Out: 1120 [Urine:1100; Drains:20] Intake/Output from this shift: Total I/O In: 240 [P.O.:240] Out: -   Labs:  Basename 05/21/12 0540 05/20/12 1523 05/20/12 1255 05/20/12 1136  WBC -- 8.0 6.1 --  HGB -- 12.7 13.0 13.3  PLT -- 240 239 --  LABCREA -- -- -- --  CREATININE 0.45* 0.60 -- 0.60   Estimated Creatinine Clearance: 81.2 ml/min (by C-G formula based on Cr of 0.45). No results found for this basename: VANCOTROUGH:2,VANCOPEAK:2,VANCORANDOM:2,GENTTROUGH:2,GENTPEAK:2,GENTRANDOM:2,TOBRATROUGH:2,TOBRAPEAK:2,TOBRARND:2,AMIKACINPEAK:2,AMIKACINTROU:2,AMIKACIN:2, in the last 72 hours   Microbiology: Recent Results (from the past 720 hour(s))  WOUND CULTURE     Status: Normal (Preliminary result)   Collection Time   05/20/12  1:19 PM      Component Value Range Status Comment   Specimen Description WOUND LEG RIGHT   Final    Special Requests PATIENT ON FOLLOWING VANCOMYCIN AND ROCEPHIN   Final    Gram Stain     Final    Value: FEW WBC PRESENT,BOTH PMN AND MONONUCLEAR     NO ORGANISMS SEEN     Gram Stain Report Called to,Read Back By and Verified With: Gram Stain Report Called to,Read Back By and Verified With: FARGO AMY @15 :05 ON 05/20/12 BY MURPHY D Performed by [facility]   Culture NO GROWTH   Final    Report Status PENDING   Incomplete   ANAEROBIC CULTURE     Status: Normal (Preliminary result)   Collection Time   05/20/12  1:19 PM      Component Value  Range Status Comment   Specimen Description WOUND LEG RIGHT   Final    Special Requests PATIENT ON FOLLOWING VANCOMYCIN AND ROCEPHIN   Final    Gram Stain     Final    Value: FEW WBC PRESENT,BOTH PMN AND MONONUCLEAR     NO ORGANISMS SEEN     Gram Stain Report Called to,Read Back By and Verified With: Gram Stain Report Called to,Read Back By and Verified With: FARGO AMY @15 :05 ON 05/20/12 BY MURPHY D Performed by [facility]   Culture     Final    Value: NO ANAEROBES ISOLATED; CULTURE IN PROGRESS FOR 5 DAYS   Report Status PENDING   Incomplete   GRAM STAIN     Status: Normal   Collection Time   05/20/12  1:19 PM      Component Value Range Status Comment   Specimen Description WOUND LEG RIGHT   Final    Special Requests PATIENT ON FOLLOWING VANCOMYCIN AND ROCHEPIN   Final    Gram Stain     Final    Value: FEW WBC PRESENT,BOTH PMN AND MONONUCLEAR     NO ORGANISMS SEEN     Gram Stain Report Called to,Read Back By and Verified With: [id number]   Report Status 05/20/2012 FINAL   Final   BODY FLUID CULTURE     Status: Normal (Preliminary result)   Collection Time   05/20/12  1:23 PM  Component Value Range Status Comment   Specimen Description FLUID KNEE RIGHT   Final    Special Requests PATIENT ON FOLLOWING VANCOMYCIN AND RECEPHIN   Final    Gram Stain     Final    Value: ABUNDANT WBC PRESENT,BOTH PMN AND MONONUCLEAR     NO ORGANISMS SEEN     Gram Stain Report Called to,Read Back By and Verified With: Gram Stain Report Called to,Read Back By and Verified With: FARFO AMY @15 :05 on 05/20/12 BY MURPHY D Performed by [facility]   Culture NO GROWTH   Final    Report Status PENDING   Incomplete   ANAEROBIC CULTURE     Status: Normal (Preliminary result)   Collection Time   05/20/12  1:23 PM      Component Value Range Status Comment   Specimen Description FLUID KNEE RIGHT   Final    Special Requests PATIENT ON FOLLOWING VANCOMYCIN AND ROCEPHIN   Final     Gram Stain     Final    Value: ABUNDANT WBC PRESENT,BOTH PMN AND MONONUCLEAR     NO ORGANISMS SEEN     Gram Stain Report Called to,Read Back By and Verified With: Gram Stain Report Called to,Read Back By and Verified With: FARFO AMY @15 :05 ON 05/20/12 BY MURPHY D Performed by [facility]   Culture     Final    Value: NO ANAEROBES ISOLATED; CULTURE IN PROGRESS FOR 5 DAYS   Report Status PENDING   Incomplete   GRAM STAIN     Status: Normal   Collection Time   05/20/12  1:23 PM      Component Value Range Status Comment   Specimen Description FLUID KNEE RIGHT   Final    Special Requests PATIENT ON FOLLOWING VANCOMYCIN AND ROCEPHIN   Final    Gram Stain     Final    Value: ABUNDANT WBC PRESENT,BOTH PMN AND MONONUCLEAR     NO ORGANISMS SEEN     Gram Stain Report Called to,Read Back By and Verified With: FARFO,[id number]/[email or web address]   Report Status 05/20/2012 FINAL   Final    Medical History: Past Medical History  Diagnosis Date  . Anxiety   . Hyperlipidemia   . Dependent edema   . Perimenopausal   . Vitamin D deficiency disease   . GERD (gastroesophageal reflux disease)   . Colitis 11/11    hx gi bleed ; no colitis since  . History of blood transfusion 11/11     d/t acute colitis  . Deep vein blood clot of left lower extremity     saw Dr. Arbie Cookey 12/17 , has clot behind l knee currently   Medications:  Anti-infectives     Start     Dose/Rate Route Frequency Ordered Stop   05/21/12 1300   cefTRIAXone (ROCEPHIN) 2 g in dextrose 5 % 50 mL IVPB     Comments: First dose now      2 g 100 mL/hr over 30 Minutes Intravenous Every 24 hours 05/21/12 1136     05/20/12 1100   vancomycin (VANCOCIN) IVPB 1000 mg/200 mL premix        1,000 mg 200 mL/hr over 60 Minutes Intravenous On call to O.R. 05/20/12 1049 05/20/12 1300   05/20/12 1049   cefTRIAXone (ROCEPHIN) 2 g in dextrose 5 % 50 mL IVPB        2 g 100 mL/hr over 30 Minutes Intravenous  Once 05/20/12 1049 05/20/12 1330  Assessment:  58yo F w/ septic arthritis after R knee arthroscopy. Underwent I&D 1/14. Cultures negative so far. ID following.  Empiric Rocephin and Vanc.  Renal fxn is good.  Goal of Therapy:  Vancomycin trough level 15-20 mcg/ml  Plan:   Vancomycin 1250mg  IV q12h (Chose a higher dose and q12h interval to facilitate home administration).  Rocephin 2g IV q24h per MD.  Measure Vanc trough at steady state.  Follow up renal fxn and culture results.  Charolotte Eke, PharmD, pager [phone]. 05/21/2012,11:58 AM.

## 2012-05-21 NOTE — Progress Notes (Signed)
Subjective: 1 Day Post-Op Procedure(s) (LRB): ARTHROSCOPY KNEE (Right) arthroscopic I&D for incisional drainage post-arthroscopy Patient reports pain as 5 on 0-10 scale, located in medial and anterior knee. Severe pain when patient was seen in office pre-op yesterday is definitely improved. Received dilaudid overnight for severe pain. Feeling slightly nauseous with pain medications.   Objective: Vital signs in last 24 hours: Temp:  [97.1 F (36.2 C)-98.1 F (36.7 C)] 98.1 F (36.7 C) (01/15 0644) Pulse Rate:  [60-94] 71  (01/15 0644) Resp:  [13-18] 14  (01/15 0644) BP: (106-145)/(46-87) 106/66 mmHg (01/15 0644) SpO2:  [93 %-100 %] 93 % (01/15 0644) Weight:  [80.287 kg (177 lb)-80.74 kg (178 lb)] 80.287 kg (177 lb) (01/14 1500)  Intake/Output from previous day: 01/14 0701 - 01/15 0700 In: 2957.5 [P.O.:840; I.V.:2117.5] Out: 1120 [Urine:1100; Drains:20] Intake/Output this shift:     Basename 05/20/12 1523 05/20/12 1255 05/20/12 1136  HGB 12.7 13.0 13.3    Basename 05/20/12 1523 05/20/12 1255  WBC 8.0 6.1  RBC 4.16 4.27  HCT 38.3 39.2  PLT 240 239    Basename 05/21/12 0540 05/20/12 1523 05/20/12 1136  NA 135 -- 141  K 4.0 -- 3.7  CL 104 -- 111  CO2 24 -- --  BUN 8 -- 14  CREATININE 0.45* 0.60 --  GLUCOSE 150* -- 91  CALCIUM 8.3* -- --   No results found for this basename: LABPT:2,INR:2 in the last 72 hours  Neurologically intact Neurovascular intact Sensation intact distally Intact pulses distally Dorsiflexion/Plantar flexion intact Incision: dressing C/D/I No cellulitis present Compartment soft No calf pain or evidence of DVT  Assessment/Plan: 1 Day Post-Op Procedure(s) (LRB): ARTHROSCOPY KNEE (Right) Advance diet Up with therapy Continue ABX therapy due to Post-op infection Discharge home with home health for IV abx therapy- PICC line ordered Appreciate ID consult and input, plan to D/C home on IV vanco & ceftriaxone for at least 2 weeks prior to  switching to PO abx. Pt to follow with ID in 3-4 days Hemovac drain to remain in place R knee upon D/C. Instructed pt to follow up with Dr. Shelle Iron Friday AM to pull drain & change dressing Will D/C with Percocet and Robaxin for pain  Lynna Zamorano M. 05/21/2012, 7:39 AM

## 2012-05-21 NOTE — Op Note (Signed)
NAMESHANITHA, TWINING                ACCOUNT NO.:  [id number]  MEDICAL RECORD NO.:  [id number]  LOCATION:  1613                         FACILITY:  [facility]  PHYSICIAN:  Jene Every, M.D.    DATE OF BIRTH:  18-Jun-1955  DATE OF PROCEDURE: DATE OF DISCHARGE:                              OPERATIVE REPORT   PREOPERATIVE DIAGNOSIS:  Possible wound infection, status post arthroscopy of the knee with possible septic arthritis.  POSTOPERATIVE DIAGNOSIS:  Loose body degenerative disk disease, possible septic arthritis.  PROCEDURE PERFORMED:  Right knee arthroscopy and synovectomy, irrigation debridement of medial portal with removal of loose body.  HISTORY:  This is a 57 year old female with 3-1/2 weeks status post a knee arthroscopy done well with no pain and back to her usual level of activity.  Approximately 2 days prior to this procedure, the patient had copious drainage from the medial portal and increasing pain.  No fevers or chills or change of bowel or bladder function.  She presented to the office.  With some pressure on the medial portal, there was scant redness, but had minimal pain.  She has had some pain with range of motion of her knee, but not a severe pain.  No evidence of DVT.  I made a small incision there and copious portions of clear fluid was evacuated.  I placed under strict sterile conditions a drain in there and felt best given the probing of the medial portal that may have extended deep into the joint.  Therefore, schedule her for diagnostic arthroscopy and lavaged with the risks and benefits including bleeding, infection, DVT,  PE.  I discussed this with Infectious Disease and we discussed PICC line postoperatively perhaps waiting for the cultures vanco and ceftriaxone.  We also discussed risks and benefits of bleeding, infection, etc.  TECHNIQUE:  With the patient in a supine position, after induction of adequate general anesthesia, 1 g of vanco and ceftriaxone,  the right lower extremity was prepped and draped in usual sterile fashion after a Nu Gauze had been removed.  I then used aerobic and anaerobic cultures in the subcutaneous portal with clear fluids and sent that for culture. Then, we made a small incision in the lateral previous portal region and inserted the cannula and drained.  The knee had some slightly cloudy, but not a grossly purulent fluid that was sent also for an intraarticular Gram stain, aerobic and anaerobic culture.  I then proceed with an arthroscopy using 6000 liters of fluid.  I introduced a shaver and did gentle debridement in the suprapatellar region, synovectomy, severe chondromalacia was noted.  Mediolateral compartment were evacuated.  Lateral compartment with severe grade 4 changes. Anterior layer of the ACL, there was small fragment of the tibial plateau to the loose body with the attachment of the tibial branch of the ACL that was removed with pituitary about a in diameter.  It seemed to be somewhat loose.  Medial compartment and some grade 2 changes of femoral condyle, this was irrigated and debridement, synovectomies were performed bilaterally across the popliteal space.  Flex and extend the knee and copiously irrigated the wound with 6000 mL and then through the medial portal was  also irrigated with __________  and opened with a hemostat and then placing the cannula there.  I placed a Hemovac through there and removed the cannulas and stitched the Hemovac in with 4-0 nylon simple suture.  Then, I put a 4-0 nylon simple suture over the lateral portal and the Hemovac to drainage.  Wound was dressed sterilely.  We placed in immobilizer after sterile dressings were applied.  She was then awoken without difficulty and transported to the recovery room in satisfactory condition.  The patient tolerated the procedure well with no complications. Postoperative course will be IV antibiotics, and then home  and infectious disease consultation pending culture and Gram stain.  The Gram stain from the office showed PMNs, but no organisms.  Hypodense was suspicious enough from the presence of the subcutaneous tissue was the intra-articular fluid and her increasing pain particularly this morning, which indicated there was very high probability of starting infection, and therefore, this was the appropriate treatment for that to avoid gross sepsis etc., and minimal blood loss.     Jene Every, M.D.     Cordelia Pen  D:  05/20/2012  T:  05/21/2012  Job:  [id number]

## 2012-05-22 NOTE — Discharge Summary (Signed)
Physician Discharge Summary   Patient ID: Julia Murray MRN: [id number] DOB/AGE: [birth year] 58 y.o.  Admit date: 05/20/2012 Discharge date: 05/21/2012  Primary Diagnosis:   right knee drainage status post prior knee arthroscopy- wound dehiscence  Admission Diagnoses:  Past Medical History  Diagnosis Date  . Anxiety   . Hyperlipidemia   . Dependent edema   . Perimenopausal   . Vitamin D deficiency disease   . GERD (gastroesophageal reflux disease)   . Colitis 11/11    hx gi bleed ; no colitis since  . History of blood transfusion 11/11     d/t acute colitis  . Deep vein blood clot of left lower extremity     saw Dr. Arbie Cookey 12/17 , has clot behind l knee currently   Discharge Diagnoses:   Principal Problem:  *DJD (degenerative joint disease) of knee  Procedure:  Procedure(s) (LRB): ARTHROSCOPY KNEE (Right) with I&D  Consults: ID for abx recommendations (seen by Dr. Luciana Axe)  HPI:  see H&P. Pt s/p R knee arthroscopy 12/20 by Dr. Shelle Iron. Seen in office for scheduled post-op appt 1/7 and sutures removed, steris placed. 1/12 pt noted spontaneous drainage and increased pain. Was seen in office 1/14 with continued drainage and mild erythema. She was brought to the OR same day for arthroscopic I&D right knee.    Laboratory Data: Admission on 04/25/2012, Discharged on 04/25/2012  Component Date Value Range Status  . Hemoglobin 04/25/2012 10.4* 12.0 - 15.0 g/dL Final    Basename 62/95/28 1523 05/20/12 1255 05/20/12 1136  HGB 12.7 13.0 13.3    Basename 05/20/12 1523 05/20/12 1255  WBC 8.0 6.1  RBC 4.16 4.27  HCT 38.3 39.2  PLT 240 239    Basename 05/21/12 0540 05/20/12 1523 05/20/12 1136  NA 135 -- 141  K 4.0 -- 3.7  CL 104 -- 111  CO2 24 -- --  BUN 8 -- 14  CREATININE 0.45* 0.60 --  GLUCOSE 150* -- 91  CALCIUM 8.3* -- --   No results found for this basename: LABPT:2,INR:2 in the last 72 hours  X-Rays:Dg Chest Port 1 View  05/20/2012  **ADDENDUM** CREATED:  05/20/2012 23:38:25  Tip is approximately 5 cm beyond the cavoatrial junction.  **END ADDENDUM** SIGNED BY: D. Oley Balm III, MD   05/20/2012  *RADIOLOGY REPORT*  Clinical Data: Line placement.  PORTABLE CHEST - 1 VIEW  Comparison: None.  Findings: Right PICC catheter with tip over the right atrium. Shallow inspiration.  Normal heart size and pulmonary vascularity. No focal airspace disease in the lungs.  No blunting of costophrenic angles.  No pneumothorax.  IMPRESSION: Right PICC catheter tip overlies the right atrium.  No pneumothorax.   Original Report Authenticated By: Burman Nieves, M.D.     EKG: Orders placed during the hospital encounter of 04/25/12  . EKG 12-LEAD  . EKG 12-LEAD  . EKG     Hospital Course: Patient was admitted to [facility] and taken to the OR and underwent the above state procedure without complications.  Patient tolerated the procedure well and was later transferred to the recovery room and then to the orthopaedic floor for postoperative care.  They were given PO and IV analgesics for pain control following their surgery.  They were given postoperative antibiotics- vancomycin and ceftriaxone; it was also recommended that she be discharged home on both IV abx.   PT was consulted postop to assist with mobility and transfers.  The patient was allowed to be WBAT with therapy. Discharge  planning was consulted to help with postop disposition and equipment needs.  Patient had a fair night on the evening of surgery and started to get up OOB with therapy on day one. Patient was seen in rounds and was ready to go home on day one.  They were given discharge instructions and dressing directions. She has a hemovac drain which will remain in upon discharge and be removed in the office later this week.  They were instructed on when to follow up in the office with Dr. Shelle Iron.  Discharge Medications: Prior to Admission medications   Medication Sig Start Date End Date Taking?  Authorizing Provider  aspirin 325 MG tablet Take 325 mg by mouth daily.   Yes Historical Provider, MD  atorvastatin (LIPITOR) 20 MG tablet Take 1 tablet (20 mg total) by mouth daily. 04/01/12  Yes Margaree Mackintosh, MD  esomeprazole (NEXIUM) 40 MG capsule Take 1 capsule (40 mg total) by mouth daily before breakfast. 07/06/11  Yes Margaree Mackintosh, MD  estradiol (ESTRACE) 0.5 MG tablet Take 0.5 mg by mouth daily.   Yes Historical Provider, MD  fluticasone (FLONASE) 50 MCG/ACT nasal spray Place 2 sprays into the nose daily.   Yes Historical Provider, MD  ibuprofen (ADVIL,MOTRIN) 800 MG tablet Take 800 mg by mouth every 8 (eight) hours as needed. For pain.   Yes Historical Provider, MD  dextrose 5 % SOLN 50 mL with cefTRIAXone 2 G SOLR 2 g Inject 2 g into the vein daily. 05/21/12 06/04/12  Dayna Barker. Lorrain Rivers, PA-C  methocarbamol (ROBAXIN) 500 MG tablet Take 1 tablet (500 mg total) by mouth 3 (three) times daily as needed (for pain/spasms). 05/21/12   Dayna Barker. Danis Pembleton, PA-C  oxyCODONE-acetaminophen (ROXICET) 5-325 MG per tablet Take 1-2 tablets by mouth every 4 (four) hours as needed for pain. 05/21/12   Dayna Barker. Tallon Gertz, PA-C  sodium chloride 0.9 % SOLN 250 mL with vancomycin 10 G SOLR 1,250 mg Inject 1,250 mg into the vein every 12 (twelve) hours. 05/21/12 06/04/12  Dayna Barker. Christene Lye, PA-C    Diet: Regular diet Activity:WBAT Follow-up:in 2 days - Friday 1/17 with Dr Shelle Iron Disposition - Home with home IV therapy Discharged Condition: good   Discharge Orders    Future Appointments: Provider: Department: Dept Phone: Center:   10/09/2012 9:00 AM Margaree Mackintosh, MD Sharlet Salina, MD [phone] [facility]   10/10/2012 9:45 AM Margaree Mackintosh, MD Sharlet Salina, MD [phone] [facility]     Future Orders Please Complete By Expires   Diet - low sodium heart healthy      Call MD / Call 911      Comments:   If you experience chest pain or shortness of breath, CALL 911 and be transported to the hospital emergency room.  If  you develope a fever above 101 F, pus (white drainage) or increased drainage or redness at the wound, or calf pain, call your surgeon's office.   Constipation Prevention      Comments:   Drink plenty of fluids.  Prune juice may be helpful.  You may use a stool softener, such as Colace (over the counter) 100 mg twice a day.  Use MiraLax (over the counter) for constipation as needed.   Increase activity slowly as tolerated          Medication List     As of 05/22/2012  7:20 AM    TAKE these medications         aspirin 325 MG  tablet   Take 325 mg by mouth daily.      atorvastatin 20 MG tablet   Commonly known as: LIPITOR   Take 1 tablet (20 mg total) by mouth daily.      dextrose 5 % SOLN 50 mL with cefTRIAXone 2 G SOLR 2 g   Inject 2 g into the vein daily.      esomeprazole 40 MG capsule   Commonly known as: NEXIUM   Take 1 capsule (40 mg total) by mouth daily before breakfast.      estradiol 0.5 MG tablet   Commonly known as: ESTRACE   Take 0.5 mg by mouth daily.      fluticasone 50 MCG/ACT nasal spray   Commonly known as: FLONASE   Place 2 sprays into the nose daily.      ibuprofen 800 MG tablet   Commonly known as: ADVIL,MOTRIN   Take 800 mg by mouth every 8 (eight) hours as needed. For pain.      methocarbamol 500 MG tablet   Commonly known as: ROBAXIN   Take 1 tablet (500 mg total) by mouth 3 (three) times daily as needed (for pain/spasms).      oxyCODONE-acetaminophen 5-325 MG per tablet   Commonly known as: PERCOCET/ROXICET   Take 1-2 tablets by mouth every 4 (four) hours as needed for pain.      sodium chloride 0.9 % SOLN 250 mL with vancomycin 10 G SOLR 1,250 mg   Inject 1,250 mg into the vein every 12 (twelve) hours.           Follow-up Information    Follow up with Javier Docker, MD. Schedule an appointment as soon as possible for a visit on 05/23/2012. (make appointment for Friday morning)    Contact information:   [address] [place]  [address] [place] [address] [phone]          Signed: Dorothy Spark. 05/22/2012, 7:20 AM

## 2012-05-23 ENCOUNTER — Telehealth: Payer: Self-pay | Admitting: Internal Medicine

## 2012-05-23 ENCOUNTER — Telehealth: Payer: Self-pay | Admitting: *Deleted

## 2012-05-23 LAB — WOUND CULTURE

## 2012-05-23 NOTE — Telephone Encounter (Signed)
Thanks.  I saw the culture this am and need to change the antibiotics to Ancef 2 grams three times a day.  I was trying to find the home health agency number to call to change.  Do you know?  Thanks

## 2012-05-23 NOTE — Telephone Encounter (Signed)
I called patient regarding infection and growth of CoNS, sensitive to oxacillin.  I am changing her antibiotics to Ancef 2 gram three times a day.  No answer on patients phone.  [facility] called and orders given to stop vancomycin and ceftriaxone and start Ancef as above.

## 2012-05-23 NOTE — Telephone Encounter (Signed)
Dr. Jillyn Hidden called.  Pt at his office for hospital f/u visit.  Requested that Dr. Luciana Axe review culture results to determine whether the pt needs to continue on both vancomycin and ceftriaxone.  RN shared that pt's labs are reviewed by the ID physicians prior to the pts' hospital f/u visits within 2 weeks of discharge as well as weekly lab reports from the home health agencies.  RN shared that she would send this note to Dr. Luciana Axe per Dr. Veronda Prude request.

## 2012-05-24 LAB — BODY FLUID CULTURE

## 2012-05-25 LAB — ANAEROBIC CULTURE

## 2012-06-05 ENCOUNTER — Encounter: Payer: Self-pay | Admitting: Internal Medicine

## 2012-06-05 ENCOUNTER — Ambulatory Visit (INDEPENDENT_AMBULATORY_CARE_PROVIDER_SITE_OTHER): Payer: BC Managed Care – PPO | Admitting: Internal Medicine

## 2012-06-05 VITALS — BP 158/98 | HR 89 | Temp 98.0°F | Ht 66.0 in | Wt 181.5 lb

## 2012-06-05 DIAGNOSIS — M7989 Other specified soft tissue disorders: Secondary | ICD-10-CM

## 2012-06-05 MED ORDER — CEPHALEXIN 500 MG PO CAPS: 500.0000 mg | ORAL_CAPSULE | Freq: Four times a day (QID) | ORAL | Status: DC

## 2012-06-05 NOTE — Progress Notes (Addendum)
RN received verbal order to discontinue the patient's PICC line.  Patient identified with name and date of birth. PICC dressing removed, site unremarkable.  PICC line removed using sterile procedure @ 1430. PICC length equal to that noted in patient's hospital chart of 45 cm. Sterile petroleum gauze + sterile 4X4 applied to PICC site, pressure applied for 10 minutes and covered with Medipore tape as a pressure dressing. Patient tolerated procedure without complaints.  Patient instructed to limit use of arm for 1 hour. Patient instructed that the pressure dressing should remain in place for 24 hours. Patient verbalized understanding of these instructions. 

## 2012-06-05 NOTE — Progress Notes (Addendum)
Patient ID: Julia Murray, female   DOB: 1955/06/12, 57 y.o.   MRN: [id number]         [facility] for Infectious Disease  Patient Active Problem List  Diagnosis  . ISCHEMIC COLITIS  . GE reflux  . Hyperlipidemia  . Anxiety  . History of vitamin D deficiency  . Septic arthritis of knee, right  . Phlebitis and thrombophlebitis of femoral vein (deep) (superficial)  . DJD (degenerative joint disease) of knee    Patient's Medications  New Prescriptions   CEPHALEXIN (KEFLEX) 500 MG CAPSULE    Take 1 capsule (500 mg total) by mouth 4 (four) times daily.  Previous Medications   ASPIRIN 325 MG TABLET    Take 325 mg by mouth daily.   ATORVASTATIN (LIPITOR) 20 MG TABLET    Take 1 tablet (20 mg total) by mouth daily.   ESOMEPRAZOLE (NEXIUM) 40 MG CAPSULE    Take 1 capsule (40 mg total) by mouth daily before breakfast.   ESTRADIOL (ESTRACE) 0.5 MG TABLET    Take 0.5 mg by mouth daily.   FLUTICASONE (FLONASE) 50 MCG/ACT NASAL SPRAY    Place 2 sprays into the nose daily.   IBUPROFEN (ADVIL,MOTRIN) 800 MG TABLET    Take 800 mg by mouth every 8 (eight) hours as needed. For pain.   METHOCARBAMOL (ROBAXIN) 500 MG TABLET    Take 1 tablet (500 mg total) by mouth 3 (three) times daily as needed (for pain/spasms).   OXYCODONE-ACETAMINOPHEN (ROXICET) 5-325 MG PER TABLET    Take 1-2 tablets by mouth every 4 (four) hours as needed for pain.  Modified Medications   No medications on file  Discontinued Medications   CEFAZOLIN SODIUM (ANCEF IV)    Inject into the vein.    Subjective: Julia Murray is in for her hospital followup visit. She underwent arthroscopic surgery on her right knee on December 20. She did well initially but awoke the morning of January 14 with severe pain and swelling in her right knee  And developed spontaneous drainage from one of her incisions. She was readmitted and underwent incision and drainage. Operative cultures grew methicillin-sensitive coagulase-negative staph. She has  been receiving IV cefazolin at home without difficulty. She has now completed 16 days of therapy. She still has swelling of her right knee that gets worse the longer she is up during the day. However her pain has improved significantly. She is not using her narcotic pain medication anymore. She has been using ibuprofen and Robaxin. She has not had any fever, chills or sweats.  Objective: Temp: 98 F (36.7 C) (01/30 1352) Temp src: Oral (01/30 1352) BP: 158/98 mmHg (01/30 1352) Pulse Rate: 89  (01/30 1352)  General: she is in good spirits walking with the aid of one crutch Skin: right arm PICC site looks good Right knee remains swollen and slightly warm but the incisions are healing nicely   Assessment: Her coagulase-negative staph septic arthritis is responding to surgery and antibiotic therapy. I will have the PICC removed and change cefazolin to oral cephalexin and plan 2 more weeks of therapy.  Plan: 1. Removed PICC 2. Change IV cefazolin to oral cephalexin x2 more weeks 3. Followup in one month   Cliffton Asters, MD [facility] [place] for Infectious Disease [facility] [phone] pager   [phone] cell 06/05/2012, 2:19 PM

## 2012-07-03 LAB — AFB CULTURE WITH SMEAR (NOT AT ARMC): Acid Fast Smear: NONE SEEN

## 2012-07-08 ENCOUNTER — Ambulatory Visit: Payer: BC Managed Care – PPO | Admitting: Internal Medicine

## 2012-07-10 ENCOUNTER — Ambulatory Visit: Payer: BC Managed Care – PPO | Admitting: Internal Medicine

## 2012-07-15 ENCOUNTER — Ambulatory Visit (INDEPENDENT_AMBULATORY_CARE_PROVIDER_SITE_OTHER): Payer: BC Managed Care – PPO | Admitting: Internal Medicine

## 2012-07-15 ENCOUNTER — Encounter: Payer: Self-pay | Admitting: Internal Medicine

## 2012-07-15 VITALS — BP 132/90 | Temp 98.8°F | Wt 176.0 lb

## 2012-07-15 DIAGNOSIS — Z733 Stress, not elsewhere classified: Secondary | ICD-10-CM

## 2012-07-15 NOTE — Progress Notes (Signed)
  Subjective:    Patient ID: Julia Murray, female    DOB: [birth year], 57 y.o.   MRN: [id number]  HPI 57 year old white female who recently had arthroscopic knee surgery and developed MRSA infection. She is getting better slowly. However, his been under considerable stress with Illene Bolus, who was recently admitted to [facility] after having an alcohol level in excess of 400. She married Brandon's father when he was 62 years old. They subsequently had their own child, a daughter, who is now 86 years old. Apolinar Junes is 57 years old. He graduated from [employer]. 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 He's been drinking alcohol and smoking marijuana. He recently went to see a counselor and also a psychiatrist. However he's been calling patient and her husband frequently angry one minute and apologizing the next. She's very concerned about this. She feels he has a mental illness. Currently Apolinar Junes is living with his biological mother. He is planning to take a trip to [place] to see a girlfriend in the near future. He basically is staying in his mother's house and not trying to get out and find work.  Patient says that she grew up with an alcoholic father and attended Al-Anon. Says until 2 months ago she did not think he had an alcohol problem but says he has completely changed over the past couple of months and she's not sure what caused this change. She says her husband is worried is not sleeping. Apolinar Junes has been blaming them for his recent problems.  Apparently he has been seeing Areta Haber for counseling. He was referred to Dr. Loralie Champagne office for medication.  She is tearful in the office today relating this information. She's truly concerned about her stepson and wants to help.    Review of Systems     Objective:   Physical Exam not examined        Assessment & Plan:  Spent 35 minutes speaking with  patient about her concerns and issues. Suggested she contact counselor whom she has seen in the past to learn how to manage unpleasant telephone calls etc. Offered her antianxiety medication but she declined.

## 2012-07-15 NOTE — Patient Instructions (Addendum)
Please contact counselor you have seen in the past for an appointment

## 2012-07-16 ENCOUNTER — Ambulatory Visit (INDEPENDENT_AMBULATORY_CARE_PROVIDER_SITE_OTHER): Payer: BC Managed Care – PPO | Admitting: Internal Medicine

## 2012-07-16 ENCOUNTER — Encounter: Payer: Self-pay | Admitting: Internal Medicine

## 2012-07-16 VITALS — BP 116/74 | HR 78 | Temp 98.3°F | Ht 67.0 in | Wt 180.0 lb

## 2012-07-16 DIAGNOSIS — M009 Pyogenic arthritis, unspecified: Secondary | ICD-10-CM

## 2012-07-16 NOTE — Progress Notes (Signed)
Patient ID: AMAIYAH NORDHOFF, female   DOB: 06-Dec-1955, 56 y.o.   MRN: [id number]         [facility] for Infectious Disease  Patient Active Problem List  Diagnosis  . ISCHEMIC COLITIS  . GE reflux  . Hyperlipidemia  . Anxiety  . History of vitamin D deficiency  . Septic arthritis of knee, right  . Phlebitis and thrombophlebitis of femoral vein (deep) (superficial)  . DJD (degenerative joint disease) of knee    Patient's Medications  New Prescriptions   No medications on file  Previous Medications   ASPIRIN 325 MG TABLET    Take 325 mg by mouth daily.   ATORVASTATIN (LIPITOR) 20 MG TABLET    Take 1 tablet (20 mg total) by mouth daily.   ESOMEPRAZOLE (NEXIUM) 40 MG CAPSULE    Take 1 capsule (40 mg total) by mouth daily before breakfast.   ESTRADIOL (ESTRACE) 0.5 MG TABLET    Take 0.5 mg by mouth daily.   ESTRADIOL-NORETHINDRONE ACET 0.5-0.1 MG PER TABLET       FLUTICASONE (FLONASE) 50 MCG/ACT NASAL SPRAY    Place 2 sprays into the nose daily.   IBUPROFEN (ADVIL,MOTRIN) 800 MG TABLET    Take 800 mg by mouth every 8 (eight) hours as needed. For pain.   MELOXICAM (MOBIC) 15 MG TABLET    Take 15 mg by mouth daily.  Modified Medications   No medications on file  Discontinued Medications   No medications on file    Subjective: Ms. Julia Murray is in for her followup visit. She is feeling much better and doing well with physical therapy for her right knee. She completed 4 weeks of antibiotic therapy one month ago for her methicillin sensitive coagulase-negative staph right knee infection. She had no apparent problems tolerating her antibiotics. She has had no signs or symptoms to suggest relapse of her infection since stopping antibiotics.  Objective: Temp: 98.3 F (36.8 C) (03/12 1335) Temp src: Oral (03/12 1335) BP: 116/74 mmHg (03/12 1335) Pulse Rate: 78 (03/12 1335)  General: she is in good spirits Skin: no rash Lungs: clear Cor: regular S1 and S2 no murmurs Right knee:  Incision sites are well healed. She has mild diffuse swelling but no particular warmth or redness. She is about 90 of flexion.   Assessment: I strongly suspect that her staph septic arthritis has been cured by arthroscopic washout and one month of antibiotic therapy.  Plan: 1. Continue observation off of antibiotics 2. Followup here as needed   Cliffton Asters, MD [facility] for Infectious Disease [facility] [phone] pager   [phone] cell 07/16/2012, 1:46 PM

## 2012-08-05 ENCOUNTER — Telehealth: Payer: Self-pay | Admitting: Internal Medicine

## 2012-08-05 NOTE — Telephone Encounter (Signed)
She needs to find out what is covered. Dexilant is good but expensive. Generic Protonix is an option.

## 2012-08-06 NOTE — Telephone Encounter (Signed)
Note will be given to the patient regarding this information when she picks up her daughter's Rx.

## 2012-08-29 ENCOUNTER — Telehealth: Payer: Self-pay | Admitting: Internal Medicine

## 2012-08-29 DIAGNOSIS — K219 Gastro-esophageal reflux disease without esophagitis: Secondary | ICD-10-CM

## 2012-08-29 MED ORDER — PANTOPRAZOLE SODIUM 40 MG PO TBEC: 40.0000 mg | DELAYED_RELEASE_TABLET | Freq: Every day | ORAL | Status: DC

## 2012-08-29 NOTE — Telephone Encounter (Signed)
NEXIUM ISN'T COVERED BY INS MAY YOU CALL IN [facility] WHICH IS PROTONIX // [facility]

## 2012-08-29 NOTE — Telephone Encounter (Signed)
Will call in Protonix generic 40 mg daily #30 with 5 refills.

## 2012-10-09 ENCOUNTER — Other Ambulatory Visit: Payer: BC Managed Care – PPO | Admitting: Internal Medicine

## 2012-10-10 ENCOUNTER — Ambulatory Visit: Payer: BC Managed Care – PPO | Admitting: Internal Medicine

## 2012-10-17 ENCOUNTER — Other Ambulatory Visit: Payer: Self-pay | Admitting: Internal Medicine

## 2012-11-21 ENCOUNTER — Other Ambulatory Visit: Payer: BC Managed Care – PPO | Admitting: Internal Medicine

## 2012-11-21 DIAGNOSIS — E785 Hyperlipidemia, unspecified: Secondary | ICD-10-CM

## 2012-11-21 DIAGNOSIS — Z79899 Other long term (current) drug therapy: Secondary | ICD-10-CM

## 2012-11-21 LAB — HEPATIC FUNCTION PANEL
AST: 15 U/L (ref 0–37)
Albumin: 4.1 g/dL (ref 3.5–5.2)
Alkaline Phosphatase: 56 U/L (ref 39–117)
Indirect Bilirubin: 0.4 mg/dL (ref 0.0–0.9)
Total Protein: 6.5 g/dL (ref 6.0–8.3)

## 2012-11-21 LAB — LIPID PANEL
Cholesterol: 218 mg/dL — ABNORMAL HIGH (ref 0–200)
HDL: 101 mg/dL (ref >39)
LDL Cholesterol: 95 mg/dL (ref 0–99)
Triglycerides: 111 mg/dL (ref <150)

## 2012-11-24 ENCOUNTER — Encounter: Payer: Self-pay | Admitting: Internal Medicine

## 2012-11-24 ENCOUNTER — Ambulatory Visit (INDEPENDENT_AMBULATORY_CARE_PROVIDER_SITE_OTHER): Payer: BC Managed Care – PPO | Admitting: Internal Medicine

## 2012-11-24 VITALS — BP 126/86 | HR 88 | Temp 98.6°F | Wt 185.0 lb

## 2012-11-24 DIAGNOSIS — K219 Gastro-esophageal reflux disease without esophagitis: Secondary | ICD-10-CM

## 2012-11-24 DIAGNOSIS — E785 Hyperlipidemia, unspecified: Secondary | ICD-10-CM

## 2012-12-24 ENCOUNTER — Other Ambulatory Visit: Payer: Self-pay

## 2012-12-24 MED ORDER — DEXLANSOPRAZOLE 60 MG PO CPDR: 60.0000 mg | DELAYED_RELEASE_CAPSULE | Freq: Every day | ORAL | Status: DC

## 2013-01-05 NOTE — Patient Instructions (Addendum)
We will try to see what insurance company will approve since you have failed Protonix. Continued lipid-lowering medication. Return in 6 months.

## 2013-01-05 NOTE — Progress Notes (Signed)
  Subjective:    Patient ID: Julia Murray, female    DOB: [birth year], 57 y.o.   MRN: [id number]  HPI 57 year old female with history of GE reflux and hyperlipidemia. In December 2013 she had arthroscopy of right knee. She had medial and lateral meniscal tears with loose bodies and deficiency of the anterior cruciate ligament. Subsequently developed septic arthritis right knee. Had to have more surgery January 2014   To debride and  irrigate right knee. Also was seen by infectious disease consultant regarding infection right knee. Has not been able to exercise. History of hyperlipidemia treated with statin therapy. Lipid panel and liver functions are stable. Actually her total and LDL cholesterol has improved from previous reading 9 months ago. She's having more issues with GE reflux. She's been taking Protonix without relief of reflux symptoms. Apparently insurance will not pay for Nexium.    Review of Systems     Objective:   Physical Exam Chest clear to auscultation. Cardiac exam regular rate and rhythm.       Assessment & Plan:  Hyperlipidemia  GE reflux-not controlled with Protonix. Attempt to find out what insurance plan will cover if patient is failing Protonix. She prefers Nexium. However apparently that is not on her plan. We could consider Dexilant 60 mg daily. Also may want to screen for H. pylori.  History of septic arthritis right knee  Plan: Return in 6 months for physical examination. Consult with insurance company regarding PPI failure and other medications available. Continue lipid-lowering medication.  25 minutes spent with patient

## 2013-01-20 ENCOUNTER — Other Ambulatory Visit: Payer: Self-pay

## 2013-01-20 MED ORDER — DEXLANSOPRAZOLE 60 MG PO CPDR: 60.0000 mg | DELAYED_RELEASE_CAPSULE | Freq: Every day | ORAL | Status: DC

## 2013-02-24 ENCOUNTER — Other Ambulatory Visit: Payer: Self-pay | Admitting: *Deleted

## 2013-02-24 MED ORDER — ATORVASTATIN CALCIUM 20 MG PO TABS: 20.0000 mg | ORAL_TABLET | Freq: Every day | ORAL | Status: DC

## 2013-03-12 ENCOUNTER — Other Ambulatory Visit: Payer: Self-pay

## 2013-03-27 ENCOUNTER — Other Ambulatory Visit: Payer: Self-pay | Admitting: Internal Medicine

## 2013-05-22 ENCOUNTER — Encounter: Payer: Self-pay | Admitting: Internal Medicine

## 2013-05-22 ENCOUNTER — Ambulatory Visit (INDEPENDENT_AMBULATORY_CARE_PROVIDER_SITE_OTHER): Payer: BC Managed Care – PPO | Admitting: Internal Medicine

## 2013-05-22 VITALS — BP 114/80 | HR 80 | Temp 98.0°F | Wt 190.0 lb

## 2013-05-22 DIAGNOSIS — N390 Urinary tract infection, site not specified: Secondary | ICD-10-CM

## 2013-05-22 MED ORDER — CIPROFLOXACIN HCL 500 MG PO TABS: 500.0000 mg | ORAL_TABLET | Freq: Two times a day (BID) | ORAL | Status: DC

## 2013-05-22 NOTE — Patient Instructions (Signed)
Take Cipro as directed. Culture is pending.

## 2013-05-22 NOTE — Progress Notes (Signed)
   Subjective:    Patient ID: Julia Murray, female    DOB: 1956-03-22, 58 y.o.   MRN: [id number]  HPI Onset of back pain last week which she associated with a new mattress. Now radiating down into her left groin area. No nausea or vomiting. Pain is not severe. Says it this is always a sign of a urinary tract infection for her. Has some urgency but not dysuria. She began taking some Azo-Standard last evening so urine dipstick is inaccurate. Culture is pending.    Review of Systems     Objective:   Physical Exam no fever. Culture is pending. No significant CVA tenderness just some left back discomfort.        Assessment & Plan:  Probable UTI  Cannot rule out kidney stone at this point.  Plan: Cipro 500 mg twice daily for 10 days. Culture is pending. Patient has worsening of symptoms she is to contact me immediately.

## 2013-05-25 ENCOUNTER — Emergency Department (HOSPITAL_BASED_OUTPATIENT_CLINIC_OR_DEPARTMENT_OTHER): Payer: BC Managed Care – PPO

## 2013-05-25 ENCOUNTER — Encounter (HOSPITAL_BASED_OUTPATIENT_CLINIC_OR_DEPARTMENT_OTHER): Payer: Self-pay | Admitting: Emergency Medicine

## 2013-05-25 ENCOUNTER — Emergency Department (HOSPITAL_BASED_OUTPATIENT_CLINIC_OR_DEPARTMENT_OTHER)
Admission: EM | Admit: 2013-05-25 | Discharge: 2013-05-25 | Disposition: A | Discharge status: Home / Self Care | Payer: BC Managed Care – PPO | Attending: Emergency Medicine | Admitting: Emergency Medicine

## 2013-05-25 DIAGNOSIS — Z8679 Personal history of other diseases of the circulatory system: Secondary | ICD-10-CM | POA: Insufficient documentation

## 2013-05-25 DIAGNOSIS — Z8659 Personal history of other mental and behavioral disorders: Secondary | ICD-10-CM | POA: Insufficient documentation

## 2013-05-25 DIAGNOSIS — Z792 Long term (current) use of antibiotics: Secondary | ICD-10-CM | POA: Insufficient documentation

## 2013-05-25 DIAGNOSIS — Z791 Long term (current) use of non-steroidal anti-inflammatories (NSAID): Secondary | ICD-10-CM | POA: Insufficient documentation

## 2013-05-25 DIAGNOSIS — Z79899 Other long term (current) drug therapy: Secondary | ICD-10-CM | POA: Insufficient documentation

## 2013-05-25 DIAGNOSIS — E785 Hyperlipidemia, unspecified: Secondary | ICD-10-CM | POA: Insufficient documentation

## 2013-05-25 DIAGNOSIS — Z7982 Long term (current) use of aspirin: Secondary | ICD-10-CM | POA: Insufficient documentation

## 2013-05-25 DIAGNOSIS — IMO0002 Reserved for concepts with insufficient information to code with codable children: Secondary | ICD-10-CM | POA: Insufficient documentation

## 2013-05-25 DIAGNOSIS — N390 Urinary tract infection, site not specified: Secondary | ICD-10-CM

## 2013-05-25 DIAGNOSIS — Z8719 Personal history of other diseases of the digestive system: Secondary | ICD-10-CM | POA: Insufficient documentation

## 2013-05-25 DIAGNOSIS — Z8742 Personal history of other diseases of the female genital tract: Secondary | ICD-10-CM | POA: Insufficient documentation

## 2013-05-25 LAB — CBC WITH DIFFERENTIAL/PLATELET
BASOS ABS: 0 10*3/uL (ref 0.0–0.1)
Basophils Relative: 0 % (ref 0–1)
EOS ABS: 0.2 10*3/uL (ref 0.0–0.7)
Eosinophils Relative: 3 % (ref 0–5)
HCT: 40.1 % (ref 36.0–46.0)
Hemoglobin: 12.9 g/dL (ref 12.0–15.0)
LYMPHS ABS: 3.5 10*3/uL (ref 0.7–4.0)
Lymphocytes Relative: 50 % — ABNORMAL HIGH (ref 12–46)
MCH: 28.9 pg (ref 26.0–34.0)
MCHC: 32.2 g/dL (ref 30.0–36.0)
MCV: 89.7 fL (ref 78.0–100.0)
Monocytes Absolute: 0.7 10*3/uL (ref 0.1–1.0)
Monocytes Relative: 9 % (ref 3–12)
NEUTROS PCT: 37 % — AB (ref 43–77)
Neutro Abs: 2.5 10*3/uL (ref 1.7–7.7)
PLATELETS: 295 10*3/uL (ref 150–400)
RBC: 4.47 MIL/uL (ref 3.87–5.11)
RDW: 13.9 % (ref 11.5–15.5)
WBC: 6.9 10*3/uL (ref 4.0–10.5)

## 2013-05-25 LAB — BASIC METABOLIC PANEL
BUN: 19 mg/dL (ref 6–23)
CO2: 21 mEq/L (ref 19–32)
Calcium: 9.3 mg/dL (ref 8.4–10.5)
Chloride: 103 mEq/L (ref 96–112)
Creatinine, Ser: 0.8 mg/dL (ref 0.50–1.10)
GFR, EST NON AFRICAN AMERICAN: 80 mL/min — AB (ref >90)
GLUCOSE: 102 mg/dL — AB (ref 70–99)
POTASSIUM: 4.1 meq/L (ref 3.7–5.3)
SODIUM: 140 meq/L (ref 137–147)

## 2013-05-25 LAB — URINALYSIS, ROUTINE W REFLEX MICROSCOPIC
Bilirubin Urine: NEGATIVE
Glucose, UA: NEGATIVE mg/dL
HGB URINE DIPSTICK: NEGATIVE
Ketones, ur: NEGATIVE mg/dL
LEUKOCYTES UA: NEGATIVE
NITRITE: POSITIVE — AB
Protein, ur: NEGATIVE mg/dL
SPECIFIC GRAVITY, URINE: 1.027 (ref 1.005–1.030)
UROBILINOGEN UA: 1 mg/dL (ref 0.0–1.0)
pH: 6 (ref 5.0–8.0)

## 2013-05-25 LAB — URINE MICROSCOPIC-ADD ON

## 2013-05-25 LAB — URINE CULTURE

## 2013-05-25 MED ORDER — SODIUM CHLORIDE 0.9 % IV SOLN
Freq: Once | INTRAVENOUS | Status: AC
  Administered 2013-05-25: 03:00:00 via INTRAVENOUS

## 2013-05-25 MED ORDER — OXYCODONE-ACETAMINOPHEN 5-325 MG PO TABS: 1.0000 | ORAL_TABLET | Freq: Four times a day (QID) | ORAL | Status: DC | PRN

## 2013-05-25 MED ORDER — FENTANYL CITRATE 0.05 MG/ML IJ SOLN
100.0000 ug | Freq: Once | INTRAMUSCULAR | Status: AC
  Administered 2013-05-25: 100 ug via INTRAVENOUS
  Filled 2013-05-25: qty 2

## 2013-05-25 MED ORDER — KETOROLAC TROMETHAMINE 30 MG/ML IJ SOLN
30.0000 mg | Freq: Once | INTRAMUSCULAR | Status: AC
  Administered 2013-05-25: 30 mg via INTRAVENOUS
  Filled 2013-05-25: qty 1

## 2013-05-25 MED ORDER — NITROFURANTOIN MONOHYD MACRO 100 MG PO CAPS: 100.0000 mg | ORAL_CAPSULE | Freq: Two times a day (BID) | ORAL | Status: DC

## 2013-05-25 MED ORDER — DICLOFENAC SODIUM 50 MG PO TBEC: 50.0000 mg | DELAYED_RELEASE_TABLET | Freq: Two times a day (BID) | ORAL | Status: DC

## 2013-05-25 NOTE — ED Provider Notes (Signed)
CSN: [id number]     Arrival date & time 05/25/13  0232 History   First MD Initiated Contact with Patient 05/25/13 0305     Chief Complaint  Patient presents with  . Back Pain   (Consider location/radiation/quality/duration/timing/severity/associated sxs/prior Treatment) Patient is a 58 y.o. female presenting with back pain. The history is provided by the patient. No language interpreter was used.  Back Pain Location:  Lumbar spine (left lateral at the area of L4/5 paraspinal) Quality:  Aching Radiates to: up and down in that area. Pain severity:  Severe Pain is:  Same all the time Onset quality:  Sudden Duration:  1 week Timing:  Intermittent Progression:  Unchanged Chronicity:  New Context: not lifting heavy objects   Relieved by:  Nothing Worsened by:  Nothing tried Ineffective treatments:  NSAIDs (cipro) Associated symptoms: no abdominal pain, no abdominal swelling, no bladder incontinence, no bowel incontinence, no chest pain, no dysuria, no fever, no headaches, no leg pain, no numbness, no paresthesias, no perianal numbness, no tingling, no weakness and no weight loss   Risk factors: no recent surgery   Patient states pain is markedly worse this evening.  Heat is not working to ameliorate the pain.  Patient denies urinary frequency, hesitancy, and hematuria.  She denies vomiting and diarrhea.    Past Medical History  Diagnosis Date  . Anxiety   . Hyperlipidemia   . Dependent edema   . Perimenopausal   . Vitamin D deficiency disease   . GERD (gastroesophageal reflux disease)   . Colitis 11/11    hx gi bleed ; no colitis since  . History of blood transfusion 11/11     d/t acute colitis  . Deep vein blood clot of left lower extremity     saw Dr. Donnetta Hutching 12/17 , has clot behind l knee currently   Past Surgical History  Procedure Laterality Date  . Anterior cruciate ligament repair      left  . Tonsillectomy    . Breast surgery      mutiple bx  . Knee arthroscopy with  medial menisectomy  04/25/2012    Procedure: KNEE ARTHROSCOPY WITH MEDIAL MENISECTOMY;  Surgeon: Johnn Hai, MD;  Location: [facility];  Service: Orthopedics;  Laterality: Right;  Right Knee Arthrscopy with Partial Medial and Lateral Menisectomy with Removal of Loose Bodies  . Knee arthroscopy  05/20/2012    Procedure: ARTHROSCOPY KNEE;  Surgeon: Johnn Hai, MD;  Location: [facility];  Service: Orthopedics;  Laterality: Right;  right knee scope with incision and drainage and removal of loose foreign body    Family History  Problem Relation Age of Onset  . Heart disease Father   . Heart attack Father   . Diabetes Mother    History  Substance Use Topics  . Smoking status: Never Smoker   . Smokeless tobacco: Never Used  . Alcohol Use: 3.0 oz/week    5 Shots of liquor per week   OB History   Grav Para Term Preterm Abortions TAB SAB Ect Mult Living                 Review of Systems  Constitutional: Negative for fever and weight loss.  Cardiovascular: Negative for chest pain.  Gastrointestinal: Negative for vomiting, abdominal pain and bowel incontinence.  Genitourinary: Negative for bladder incontinence, dysuria, frequency and hematuria.  Musculoskeletal: Positive for back pain.  Neurological: Negative for tingling, weakness, numbness, headaches and paresthesias.  All other systems  reviewed and are negative.    Allergies  Shellfish allergy  Home Medications   Current Outpatient Rx  Name  Route  Sig  Dispense  Refill  . aspirin 325 MG tablet   Oral   Take 325 mg by mouth daily.         Marland Kitchen atorvastatin (LIPITOR) 20 MG tablet   Oral   Take 1 tablet (20 mg total) by mouth daily.   90 tablet   1   . ciprofloxacin (CIPRO) 500 MG tablet   Oral   Take 1 tablet (500 mg total) by mouth 2 (two) times daily.   20 tablet   0   . fluticasone (FLONASE) 50 MCG/ACT nasal spray      USE 2 SPRAYS IN EACH NOSTRIL EVERY MORNING   16 g    11   . ibuprofen (ADVIL,MOTRIN) 800 MG tablet   Oral   Take 800 mg by mouth every 8 (eight) hours as needed. For pain.         . meloxicam (MOBIC) 15 MG tablet   Oral   Take 15 mg by mouth daily.          BP 145/68  Pulse 60  Temp(Src) 98.5 F (36.9 C) (Oral)  Resp 16  Wt 182 lb (82.555 kg)  SpO2 98% Physical Exam  Constitutional: She is oriented to person, place, and time. She appears well-developed and well-nourished.  HENT:  Head: Normocephalic and atraumatic.  Mouth/Throat: Oropharynx is clear and moist.  Eyes: Conjunctivae are normal. Pupils are equal, round, and reactive to light.  Neck: Normal range of motion. Neck supple.  Cardiovascular: Normal rate and regular rhythm.   Pulmonary/Chest: Effort normal and breath sounds normal. She has no wheezes. She has no rales.  Abdominal: Soft. Bowel sounds are normal. There is no tenderness. There is no rebound and no guarding.  No CVA tenderness.  No bruising of the back, no crepitance, alignment normal   Musculoskeletal: Normal range of motion.  Neurological: She is alert and oriented to person, place, and time.  Skin: Skin is warm and dry.  Psychiatric: Thought content normal.    ED Course  Procedures (including critical care time) Labs Review Labs Reviewed  CBC WITH DIFFERENTIAL  BASIC METABOLIC PANEL  URINALYSIS, ROUTINE W REFLEX MICROSCOPIC   Imaging Review No results found.  EKG Interpretation   None       MDM  3:00 am:  Medications including: Toradol and Fentanyl as well as CT ordered immediately upon arrival.  IV inserted by patient's nurse Mortimer Fries and Medications administered and then patient was immediately transported to CT scan  DDX: 1. Muscle strain 2. UTI 3. Nephrolithiasis     Labs and CT scan reviewed with patient and her husband with Mortimer Fries, patient's nurse present.  There are not kidney stones nor signs of pyelonephritis.  Bones and normal in appearance.  Creatinine is normal as are  electrolytes.  The hemoglobin and white count is normal.     Blood work is normal and CT scan shows no abnormality.   Nitrites are positive on urine, still a few bacteria. Culture for her visit to Dr. Verlene Mayer office is still pending at this time.  Plan to change antibiotic to Macrobid as it has a lower resistance in the community.  Originally prescribed percocet for patient's pain but patient declines RX.  Patient states that she would be amenable to Voltaren.  EDP immediately changed RX.     Ginamarie Banfield Alfonso Patten, MD  05/25/13 0505 

## 2013-05-25 NOTE — ED Notes (Signed)
Pt reports back pain onset 1 week ago seen by Dr. Renold Genta given cipro 500mg  bid . Back pain worsen tonight and now radiating toward low abd and pelvis denies dysuria

## 2013-05-28 ENCOUNTER — Other Ambulatory Visit: Payer: BC Managed Care – PPO | Admitting: Internal Medicine

## 2013-05-29 ENCOUNTER — Encounter: Payer: BC Managed Care – PPO | Admitting: Internal Medicine

## 2013-06-09 ENCOUNTER — Telehealth: Payer: Self-pay | Admitting: Internal Medicine

## 2013-06-09 NOTE — Telephone Encounter (Signed)
Patient declines appointment this evening stating she's too busy since she's going out of town.  Patient is already scheduled for CPE on 2/9 @ 10:00.  Will address UTI at that time. Patient will be out of town beginning tomorrow, 2/4 until late Friday evening.  Apologized to patient, but having nothing else to offer aside from today at 4 or 4:30; still declined appointment.

## 2013-06-15 ENCOUNTER — Ambulatory Visit (INDEPENDENT_AMBULATORY_CARE_PROVIDER_SITE_OTHER): Payer: BC Managed Care – PPO | Admitting: Internal Medicine

## 2013-06-15 ENCOUNTER — Encounter: Payer: Self-pay | Admitting: Internal Medicine

## 2013-06-15 VITALS — BP 128/82 | HR 76 | Temp 97.8°F | Ht 65.0 in | Wt 190.0 lb

## 2013-06-15 DIAGNOSIS — E559 Vitamin D deficiency, unspecified: Secondary | ICD-10-CM

## 2013-06-15 DIAGNOSIS — Z1329 Encounter for screening for other suspected endocrine disorder: Secondary | ICD-10-CM

## 2013-06-15 DIAGNOSIS — R609 Edema, unspecified: Secondary | ICD-10-CM

## 2013-06-15 DIAGNOSIS — Z130 Encounter for screening for diseases of the blood and blood-forming organs and certain disorders involving the immune mechanism: Secondary | ICD-10-CM

## 2013-06-15 DIAGNOSIS — Z79899 Other long term (current) drug therapy: Secondary | ICD-10-CM

## 2013-06-15 DIAGNOSIS — E785 Hyperlipidemia, unspecified: Secondary | ICD-10-CM

## 2013-06-15 DIAGNOSIS — R1032 Left lower quadrant pain: Secondary | ICD-10-CM

## 2013-06-15 DIAGNOSIS — Z0000 Encounter for general adult medical examination without abnormal findings: Secondary | ICD-10-CM

## 2013-06-15 DIAGNOSIS — R300 Dysuria: Secondary | ICD-10-CM

## 2013-06-15 DIAGNOSIS — K219 Gastro-esophageal reflux disease without esophagitis: Secondary | ICD-10-CM

## 2013-06-15 LAB — POCT URINALYSIS DIPSTICK
Bilirubin, UA: NEGATIVE
Blood, UA: NEGATIVE
Glucose, UA: NEGATIVE
Ketones, UA: NEGATIVE
Leukocytes, UA: NEGATIVE
Nitrite, UA: NEGATIVE
PROTEIN UA: NEGATIVE
SPEC GRAV UA: 1.01
Urobilinogen, UA: NEGATIVE
pH, UA: 8

## 2013-06-15 LAB — LIPID PANEL
CHOL/HDL RATIO: 2.2 ratio
Cholesterol: 215 mg/dL — ABNORMAL HIGH (ref 0–200)
HDL: 98 mg/dL (ref >39)
LDL Cholesterol: 80 mg/dL (ref 0–99)
Triglycerides: 184 mg/dL — ABNORMAL HIGH (ref <150)
VLDL: 37 mg/dL (ref 0–40)

## 2013-06-15 LAB — COMPREHENSIVE METABOLIC PANEL
ALT: 26 U/L (ref 0–35)
AST: 18 U/L (ref 0–37)
Albumin: 4.4 g/dL (ref 3.5–5.2)
Alkaline Phosphatase: 55 U/L (ref 39–117)
BILIRUBIN TOTAL: 0.7 mg/dL (ref 0.2–1.2)
BUN: 20 mg/dL (ref 6–23)
CHLORIDE: 103 meq/L (ref 96–112)
CO2: 27 mEq/L (ref 19–32)
Calcium: 9.3 mg/dL (ref 8.4–10.5)
Creat: 0.58 mg/dL (ref 0.50–1.10)
Glucose, Bld: 87 mg/dL (ref 70–99)
Potassium: 4.4 mEq/L (ref 3.5–5.3)
SODIUM: 138 meq/L (ref 135–145)
Total Protein: 6.7 g/dL (ref 6.0–8.3)

## 2013-06-15 LAB — CBC WITH DIFFERENTIAL/PLATELET
BASOS ABS: 0 10*3/uL (ref 0.0–0.1)
Basophils Relative: 1 % (ref 0–1)
EOS ABS: 0.2 10*3/uL (ref 0.0–0.7)
EOS PCT: 4 % (ref 0–5)
HCT: 36.8 % (ref 36.0–46.0)
Hemoglobin: 12.2 g/dL (ref 12.0–15.0)
LYMPHS PCT: 49 % — AB (ref 12–46)
Lymphs Abs: 2.4 10*3/uL (ref 0.7–4.0)
MCH: 28.6 pg (ref 26.0–34.0)
MCHC: 33.2 g/dL (ref 30.0–36.0)
MCV: 86.2 fL (ref 78.0–100.0)
Monocytes Absolute: 0.5 10*3/uL (ref 0.1–1.0)
Monocytes Relative: 9 % (ref 3–12)
NEUTROS PCT: 37 % — AB (ref 43–77)
Neutro Abs: 1.8 10*3/uL (ref 1.7–7.7)
PLATELETS: 282 10*3/uL (ref 150–400)
RBC: 4.27 MIL/uL (ref 3.87–5.11)
RDW: 14.9 % (ref 11.5–15.5)
WBC: 4.9 10*3/uL (ref 4.0–10.5)

## 2013-06-15 LAB — TSH: TSH: 1.366 u[IU]/mL (ref 0.350–4.500)

## 2013-06-16 LAB — VITAMIN D 25 HYDROXY (VIT D DEFICIENCY, FRACTURES): VIT D 25 HYDROXY: 27 ng/mL — AB (ref 30–89)

## 2013-06-16 LAB — URINE CULTURE
Colony Count: NO GROWTH
Organism ID, Bacteria: NO GROWTH

## 2013-06-17 ENCOUNTER — Ambulatory Visit
Admission: RE | Admit: 2013-06-17 | Discharge: 2013-06-17 | Disposition: A | Discharge status: Home / Self Care | Payer: BC Managed Care – PPO | Source: Ambulatory Visit | Attending: Internal Medicine | Admitting: Internal Medicine

## 2013-06-17 MED ORDER — IOHEXOL 300 MG/ML  SOLN
100.0000 mL | Freq: Once | INTRAMUSCULAR | Status: AC | PRN
  Administered 2013-06-17: 100 mL via INTRAVENOUS

## 2013-06-18 ENCOUNTER — Telehealth: Payer: Self-pay

## 2013-06-18 ENCOUNTER — Telehealth: Payer: Self-pay | Admitting: Internal Medicine

## 2013-06-18 DIAGNOSIS — R1032 Left lower quadrant pain: Secondary | ICD-10-CM

## 2013-06-18 NOTE — Telephone Encounter (Signed)
Patient informed. Referral done to [facility].

## 2013-06-18 NOTE — Telephone Encounter (Signed)
CT of abdomen and pelvis show uterine fibroids but no evidence of diverticulitis. Suggest patient check with GYN physician regarding fibroids to see if they could be causing her pain. Also we will refer her back to gastroenterology to see if she needs colonoscopy if pain is persistent.

## 2013-07-15 ENCOUNTER — Encounter: Payer: Self-pay | Admitting: Internal Medicine

## 2013-07-15 ENCOUNTER — Other Ambulatory Visit: Payer: Self-pay | Admitting: Internal Medicine

## 2013-08-06 ENCOUNTER — Other Ambulatory Visit: Payer: Self-pay

## 2013-08-06 MED ORDER — FLUTICASONE PROPIONATE 50 MCG/ACT NA SUSP: 2.0000 | Freq: Every day | NASAL | Status: DC

## 2013-11-29 NOTE — Progress Notes (Signed)
Subjective:    Patient ID: Julia Murray, female    DOB: 1956/04/01, 58 y.o.   MRN: [id number]  HPI 58 year old female in today for health maintenance exam and evaluation of medical issues. History of hyperlipidemia, anxiety, GE reflux, vitamin D deficiency. Complaining of UTI symptoms today. Clean catch urine dipstick is normal.  In December 2009, she had a bout of ischemic colitis and was hospitalized in [place]. This was left-sided ischemic colitis. She was treated with Flagyl and Levaquin. She required a blood transfusion. Biopsies were compatible with ischemic colitis. Symptoms started with sudden bout of abdominal pain after returning from eating a steak dinner. She's never had a recurrence. She was seen by Dr. Fuller Plan in followup. Last colonoscopy was 2011.  No known drug allergies.  Fractured left hand 1980s. Torn anterior cruciate ligament 2007.  History of right knee knee arthroscopic surgery Dr. Tonita Cong December 2013. She had significant anterior cruciate ligament disease, bilateral menisci tears and loose bodies. Subsequently she developed infection postoperatively and had to be readmitted 05/20/2012 for IV antibiotics, right knee drainage wound dehiscence.  GYN is Dr. Cheri Fowler.  History of dependent edema for which she takes Maxzide daily. GE reflux is treated with PPI. Hyperlipidemia treated with statin medication. Patient was tried on Crestor says it caused constipation. Has been on Lipitor generic since February 2012.  Social history: She is married. Husband is vice president of pain oil company. 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 Completed 2 years of college. Does not smoke. Social alcohol consumption. She has 2 adult children, a daughter and a son. Expecting first grandchild in the spring.  Family history: Father died at age 62 of heart failure. Mother with history of diabetes but living. One brother in  good health. Father had gout, kidney stones and ulcers in addition to heart disease.    Review of Systems  Constitutional: Positive for fatigue.  HENT: Negative.   Respiratory: Negative.   Cardiovascular: Negative.   Gastrointestinal:       GE reflux  Genitourinary:       Dysuria  Neurological: Negative.   Hematological: Negative.        Objective:   Physical Exam  Constitutional: She is oriented to person, place, and time. She appears well-developed and well-nourished. No distress.  HENT:  Head: Normocephalic and atraumatic.  Left Ear: External ear normal.  Mouth/Throat: Oropharynx is clear and moist. No oropharyngeal exudate.  Eyes: Conjunctivae and EOM are normal. Pupils are equal, round, and reactive to light. Right eye exhibits no discharge. Left eye exhibits no discharge.  Neck: Neck supple. No JVD present. No thyromegaly present.  Cardiovascular: Normal rate, normal heart sounds and intact distal pulses.   No murmur heard. Pulmonary/Chest: Effort normal and breath sounds normal. No respiratory distress. She has no wheezes. She has no rales. She exhibits no tenderness.  Breasts normal female  Abdominal: Soft. Bowel sounds are normal. She exhibits no distension and no mass. There is tenderness. There is no rebound and no guarding.  Mildly tender left lower quadrant without rebound  Genitourinary:  Deferred to GYN  Musculoskeletal: Normal range of motion. She exhibits no edema.  Lymphadenopathy:    She has no cervical adenopathy.  Neurological: She is alert and oriented to person, place, and time. She has normal reflexes. No cranial nerve deficit. Coordination normal.  Skin: Skin is warm and dry. No rash noted. She is not diaphoretic.  Psychiatric: She has a normal mood and affect. Her behavior is normal. Judgment and thought content normal.          Assessment & Plan:  Hyperlipidemia-despite being on Lipitor she has elevated triglycerides and LDL cholesterol.  Watch diet and exercise.  Anxiety  History of vitamin D deficiency-vitamin D level is low. Recommend take 2000 units vitamin D 3 daily  History of right knee arthroscopic surgery 2013 with subsequent septic arthritis of right knee January 2014-now  Well.  GE reflux treated with PPI  Dependent edema treated with Maxide  Dysuria-urine dipstick is negative and culture is pending. Was treated in January 2015 with a course of Cipro at this office and urine culture grew Escherichia coli. Subsequently was seen 3 days later in the emergency department for back pain. Now complaining of UTI symptoms once again.  History of shellfish allergy  Left lower quadrant abdominal pain-etiology unclear. Schedule CT of abdomen and pelvis  Plan: Diet exercise and weight loss and return in 6 months. Continue same medications.  Addendum: Urine culture is negative. If symptoms persist, refer to urologist.  CT of abdomen and pelvis shows uterine fibroids. If pain persists, consider referral to gastroenterologist. Patient should ask GYN physician and fibroids are causing symptoms.

## 2013-11-29 NOTE — Patient Instructions (Addendum)
Diet exercise and weight loss. Urine culture pending. Return in 6 months or as needed. Schedule CT of abdomen and pelvis for dysuria and left lower quadrant abdominal pain. Continue same medications. Consider gastroenterology evaluation.

## 2013-12-04 ENCOUNTER — Other Ambulatory Visit: Payer: Self-pay | Admitting: Internal Medicine

## 2013-12-06 NOTE — Telephone Encounter (Signed)
6 month recheck due August 2015 Please see it is on book before refilling. Last seen Feb 2015.

## 2013-12-08 NOTE — Telephone Encounter (Signed)
Left message for patient to call back to schedule.  °

## 2014-01-05 ENCOUNTER — Encounter: Payer: Self-pay | Admitting: Internal Medicine

## 2014-01-05 ENCOUNTER — Ambulatory Visit (INDEPENDENT_AMBULATORY_CARE_PROVIDER_SITE_OTHER): Payer: BC Managed Care – PPO | Admitting: Internal Medicine

## 2014-01-05 VITALS — BP 120/84 | HR 76 | Wt 188.0 lb

## 2014-01-05 DIAGNOSIS — Z79899 Other long term (current) drug therapy: Secondary | ICD-10-CM

## 2014-01-05 DIAGNOSIS — E669 Obesity, unspecified: Secondary | ICD-10-CM

## 2014-01-05 DIAGNOSIS — Z23 Encounter for immunization: Secondary | ICD-10-CM

## 2014-01-05 DIAGNOSIS — Z8659 Personal history of other mental and behavioral disorders: Secondary | ICD-10-CM

## 2014-01-05 DIAGNOSIS — E785 Hyperlipidemia, unspecified: Secondary | ICD-10-CM

## 2014-01-05 DIAGNOSIS — K219 Gastro-esophageal reflux disease without esophagitis: Secondary | ICD-10-CM

## 2014-01-05 LAB — LIPID PANEL
Cholesterol: 193 mg/dL (ref 0–200)
HDL: 91 mg/dL (ref >39)
LDL CALC: 81 mg/dL (ref 0–99)
Total CHOL/HDL Ratio: 2.1 Ratio
Triglycerides: 106 mg/dL (ref <150)
VLDL: 21 mg/dL (ref 0–40)

## 2014-01-05 LAB — HEPATIC FUNCTION PANEL
ALT: 18 U/L (ref 0–35)
AST: 19 U/L (ref 0–37)
Albumin: 4.3 g/dL (ref 3.5–5.2)
Alkaline Phosphatase: 62 U/L (ref 39–117)
BILIRUBIN DIRECT: 0.1 mg/dL (ref 0.0–0.3)
BILIRUBIN INDIRECT: 0.3 mg/dL (ref 0.2–1.2)
Total Bilirubin: 0.4 mg/dL (ref 0.2–1.2)
Total Protein: 6.8 g/dL (ref 6.0–8.3)

## 2014-01-05 NOTE — Progress Notes (Signed)
   Subjective:    Patient ID: KEYRY IRACHETA, female    DOB: 18-Apr-1956, 58 y.o.   MRN: [id number]  HPI  In  today for six-month recheck. Fasting lipid panel, liver functions drawn and are pending. Has lost 2 pounds since last visit. Knee has healed well and she has no complaints with that status post arthroscopic surgery and staph infection. She is now a new grandmother. Has a grandson who was this past spring. Daughter and grandson are living with her. Daughter is attending school at [employer]. Advise continue diet and exercise efforts    Review of Systems     Objective:   Physical Exam  No thyromegaly. No JVD. No adenopathy. Chest clear to auscultation. Cardiac exam regular rate and rhythm normal S1 and S2. Extremities without edema      Assessment & Plan:  GE reflux  Hyperlipidemia  [place] immunization given today  Obesity  Plan: Continue diet and exercise efforts. Needs to lose some weight. Fasting lipid panel liver functions pending. Continue PPI for GE reflux. Return in 6 months for physical exam.

## 2014-01-05 NOTE — Patient Instructions (Signed)
Continue same meds. Flu vaccine given. Return in 6 months for physical exam. Diet exercise and weight loss.

## 2014-01-06 ENCOUNTER — Other Ambulatory Visit: Payer: Self-pay

## 2014-01-06 MED ORDER — ATORVASTATIN CALCIUM 20 MG PO TABS: 20.0000 mg | ORAL_TABLET | Freq: Every day | ORAL | Status: DC

## 2014-04-12 ENCOUNTER — Other Ambulatory Visit: Payer: Self-pay

## 2014-04-12 MED ORDER — FLUTICASONE PROPIONATE 50 MCG/ACT NA SUSP: 2.0000 | Freq: Every day | NASAL | Status: DC

## 2014-05-26 ENCOUNTER — Other Ambulatory Visit: Payer: Self-pay | Admitting: Internal Medicine

## 2014-07-23 ENCOUNTER — Telehealth: Payer: Self-pay | Admitting: Family

## 2014-07-23 NOTE — Telephone Encounter (Signed)
-----   Message from Denman George, RN sent at 07/23/2014 12:59 PM EDT ----- Regarding: nurse visit for fitting of compression hose Contact: [phone] This pt. called and requested an appt. measured for stockings?  Can you contact her and schedule this?

## 2014-07-26 ENCOUNTER — Encounter: Payer: Self-pay | Admitting: Family

## 2014-07-27 ENCOUNTER — Encounter (INDEPENDENT_AMBULATORY_CARE_PROVIDER_SITE_OTHER): Payer: BLUE CROSS/BLUE SHIELD

## 2014-07-27 DIAGNOSIS — M7989 Other specified soft tissue disorders: Secondary | ICD-10-CM

## 2014-11-21 ENCOUNTER — Other Ambulatory Visit: Payer: Self-pay | Admitting: Internal Medicine

## 2014-11-23 ENCOUNTER — Telehealth: Payer: Self-pay

## 2014-11-23 NOTE — Telephone Encounter (Signed)
Left message for patient to call office, patient is due for a mammogram.

## 2015-02-18 ENCOUNTER — Other Ambulatory Visit: Payer: Self-pay | Admitting: Internal Medicine

## 2015-02-18 NOTE — Telephone Encounter (Signed)
Check and see when recheck is due

## 2015-03-01 ENCOUNTER — Encounter: Payer: Self-pay | Admitting: Internal Medicine

## 2015-03-01 ENCOUNTER — Ambulatory Visit (INDEPENDENT_AMBULATORY_CARE_PROVIDER_SITE_OTHER): Payer: BLUE CROSS/BLUE SHIELD | Admitting: Internal Medicine

## 2015-03-01 VITALS — BP 116/80 | HR 84 | Temp 97.8°F | Resp 18 | Ht 65.0 in | Wt 193.0 lb

## 2015-03-01 DIAGNOSIS — Z23 Encounter for immunization: Secondary | ICD-10-CM

## 2015-03-01 DIAGNOSIS — H6091 Unspecified otitis externa, right ear: Secondary | ICD-10-CM

## 2015-03-01 DIAGNOSIS — H6501 Acute serous otitis media, right ear: Secondary | ICD-10-CM | POA: Diagnosis not present

## 2015-03-01 MED ORDER — NEOMYCIN-POLYMYXIN-HC 3.5-10000-1 OT SOLN: 4.0000 [drp] | Freq: Four times a day (QID) | OTIC | Status: DC

## 2015-03-01 MED ORDER — AZITHROMYCIN 250 MG PO TABS: ORAL_TABLET | ORAL | Status: DC

## 2015-03-02 ENCOUNTER — Encounter: Payer: Self-pay | Admitting: Internal Medicine

## 2015-03-02 NOTE — Patient Instructions (Signed)
Take Zithromax Z-PAK as prescribed. Use Cortisporin Otic suspension and right external ear canal 3 or 4 times a day for 5 days. Call if not better in 7-10 days or sooner if worse.

## 2015-03-02 NOTE — Progress Notes (Signed)
   Subjective:    Patient ID: Julia Murray, female    DOB: 12-13-1955, 59 y.o.   MRN: [id number]  HPI Two-week history of right ear pain. Has not had acute URI, cough, fever or chills. Slight scratchy throat. Says ear feels sore when she touches it.    Review of Systems     Objective:   Physical Exam  Pharynx is clear. Left TM is clear. Left external canal is clear. Right external canal slightly red. Right TM slightly full but not red.      Assessment & Plan:  Right serous otitis media  Right otitis externa  Plan: Zithromax Z-Pak take 2 tablets day one followed by 1 tablet days 2 through 5. Cortisporin otic solution 4 drops right external ear canal 3-4 times a day for 5 days.

## 2015-05-07 ENCOUNTER — Other Ambulatory Visit: Payer: Self-pay | Admitting: Internal Medicine

## 2015-06-24 ENCOUNTER — Encounter: Payer: BLUE CROSS/BLUE SHIELD | Admitting: Internal Medicine

## 2015-11-14 ENCOUNTER — Other Ambulatory Visit: Payer: Self-pay

## 2015-11-14 MED ORDER — FLUTICASONE PROPIONATE 50 MCG/ACT NA SUSP: 2.0000 | Freq: Every day | NASAL | Status: DC

## 2015-11-18 ENCOUNTER — Other Ambulatory Visit: Payer: Self-pay

## 2015-11-18 MED ORDER — FLUTICASONE PROPIONATE 50 MCG/ACT NA SUSP: 2.0000 | Freq: Every day | NASAL | Status: DC

## 2016-02-03 ENCOUNTER — Other Ambulatory Visit: Payer: Self-pay | Admitting: Internal Medicine

## 2016-02-03 ENCOUNTER — Other Ambulatory Visit (INDEPENDENT_AMBULATORY_CARE_PROVIDER_SITE_OTHER): Payer: BLUE CROSS/BLUE SHIELD | Admitting: Internal Medicine

## 2016-02-03 DIAGNOSIS — Z0000 Encounter for general adult medical examination without abnormal findings: Secondary | ICD-10-CM

## 2016-02-03 LAB — COMPREHENSIVE METABOLIC PANEL
ALK PHOS: 53 U/L (ref 33–130)
ALT: 13 U/L (ref 6–29)
AST: 14 U/L (ref 10–35)
Albumin: 4 g/dL (ref 3.6–5.1)
BUN: 28 mg/dL — AB (ref 7–25)
CHLORIDE: 106 mmol/L (ref 98–110)
CO2: 23 mmol/L (ref 20–31)
Calcium: 8.8 mg/dL (ref 8.6–10.4)
Creat: 0.6 mg/dL (ref 0.50–0.99)
Glucose, Bld: 87 mg/dL (ref 65–99)
POTASSIUM: 4.7 mmol/L (ref 3.5–5.3)
Sodium: 139 mmol/L (ref 135–146)
TOTAL PROTEIN: 6.6 g/dL (ref 6.1–8.1)
Total Bilirubin: 0.4 mg/dL (ref 0.2–1.2)

## 2016-02-03 LAB — CBC WITH DIFFERENTIAL/PLATELET
Basophils Absolute: 0 cells/uL (ref 0–200)
Basophils Relative: 0 %
EOS PCT: 2 %
Eosinophils Absolute: 122 cells/uL (ref 15–500)
HEMATOCRIT: 39.8 % (ref 35.0–45.0)
Hemoglobin: 13.1 g/dL (ref 11.7–15.5)
LYMPHS ABS: 2318 {cells}/uL (ref 850–3900)
Lymphocytes Relative: 38 %
MCH: 29.3 pg (ref 27.0–33.0)
MCHC: 32.9 g/dL (ref 32.0–36.0)
MCV: 89 fL (ref 80.0–100.0)
MPV: 9.4 fL (ref 7.5–12.5)
Monocytes Absolute: 366 cells/uL (ref 200–950)
Monocytes Relative: 6 %
NEUTROS ABS: 3294 {cells}/uL (ref 1500–7800)
Neutrophils Relative %: 54 %
Platelets: 282 10*3/uL (ref 140–400)
RBC: 4.47 MIL/uL (ref 3.80–5.10)
RDW: 14.1 % (ref 11.0–15.0)
WBC: 6.1 10*3/uL (ref 3.8–10.8)

## 2016-02-03 LAB — LIPID PANEL
Cholesterol: 259 mg/dL — ABNORMAL HIGH (ref 125–200)
HDL: 74 mg/dL (ref >=46)
LDL Cholesterol: 136 mg/dL — ABNORMAL HIGH (ref <130)
Total CHOL/HDL Ratio: 3.5 Ratio (ref <=5.0)
Triglycerides: 243 mg/dL — ABNORMAL HIGH (ref <150)
VLDL: 49 mg/dL — ABNORMAL HIGH (ref <30)

## 2016-02-03 LAB — TSH: TSH: 1.15 mIU/L

## 2016-02-04 LAB — VITAMIN D 25 HYDROXY (VIT D DEFICIENCY, FRACTURES): Vit D, 25-Hydroxy: 21 ng/mL — ABNORMAL LOW (ref 30–100)

## 2016-02-07 ENCOUNTER — Telehealth: Payer: Self-pay | Admitting: *Deleted

## 2016-02-07 ENCOUNTER — Ambulatory Visit (INDEPENDENT_AMBULATORY_CARE_PROVIDER_SITE_OTHER): Payer: BLUE CROSS/BLUE SHIELD | Admitting: Internal Medicine

## 2016-02-07 ENCOUNTER — Encounter: Payer: Self-pay | Admitting: Internal Medicine

## 2016-02-07 VITALS — BP 110/80 | HR 70 | Temp 98.6°F | Ht 65.0 in | Wt 183.0 lb

## 2016-02-07 DIAGNOSIS — E782 Mixed hyperlipidemia: Secondary | ICD-10-CM

## 2016-02-07 DIAGNOSIS — K219 Gastro-esophageal reflux disease without esophagitis: Secondary | ICD-10-CM

## 2016-02-07 DIAGNOSIS — Z0000 Encounter for general adult medical examination without abnormal findings: Secondary | ICD-10-CM | POA: Diagnosis not present

## 2016-02-07 DIAGNOSIS — E559 Vitamin D deficiency, unspecified: Secondary | ICD-10-CM

## 2016-02-07 DIAGNOSIS — Z23 Encounter for immunization: Secondary | ICD-10-CM | POA: Diagnosis not present

## 2016-02-07 LAB — POCT URINALYSIS DIPSTICK
Bilirubin, UA: NEGATIVE
Blood, UA: NEGATIVE
GLUCOSE UA: NEGATIVE
Ketones, UA: NEGATIVE
Leukocytes, UA: NEGATIVE
Nitrite, UA: NEGATIVE
PROTEIN UA: NEGATIVE
Spec Grav, UA: 1.015
UROBILINOGEN UA: NEGATIVE
pH, UA: 5

## 2016-02-07 NOTE — Progress Notes (Signed)
Subjective:    Patient ID: Julia Murray, female    DOB: 27-Jul-1955, 60 y.o.   MRN: [id number]  HPI 60 year old Female for health maintenance exam and  Saw Dr. Donnetta Hutching 2013 and was found to have chronic DVT treated with compression stockings.  Ran out of lipid med 6 months ago. Lipids are elevated.  Has elevated triglycerides-check Hgb AIC Needs flu vaccine today. Wants Zostavax vaccine- RX provided.  She has a history of hyperlipidemia, anxiety, GE reflux, vitamin D deficiency.  In December 2009 she had a bout of ischemic colitis and was hospitalized in [facility] This was left-sided ischemic colitis. She was treated with Flagyl and Levaquin. She required a blood transfusion. Biopsies were compatible with ischemic colitis. Symptoms started with a sudden bout of abdominal pain after returning from eating a steak dinner. She has never had a recurrence. She was seen by Dr. Fuller Plan in follow-up. Last colonoscopy was 2011.  No known drug allergies.  Fractured hand 1980s. Torn anterior cruciate ligament 2007.  History of right knee arthroscopic surgery but Dr. being December 2013. She had significant anterior cruciate ligament disease, bilateral menisci tears and loose bodies. Subsequently developed infection postoperatively and had to be readmitted 05/20/2012 for IV antibiotics, right knee drainage and wound dehiscence.  GYN is Dr. Cheri Fowler.  History of dependent edema for which she  Has taken Maxide. GE reflux treated with PPI. She has been on Lipitor since February 2012 until she ran out. She says Crestor caused constipation.  Social history: She is married. Husband is vice president of brain [employer]. 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 Completed 2 years of college. Does not smoke. Social alcohol consumption. She has 2 adult children, daughter and a son. Has one grandchild.  Family history: Father died at age 63  of heart failure. Mother with history of diabetes. One brother in good health. Father had gout, kidney stones, and ulcers in addition to heart disease.      Review of Systems noncontributory except above     Objective:   Physical Exam  Constitutional: She is oriented to person, place, and time. She appears well-developed and well-nourished.  HENT:  Head: Normocephalic and atraumatic.  Right Ear: External ear normal.  Left Ear: External ear normal.  Mouth/Throat: Oropharynx is clear and moist.  Eyes: Conjunctivae and EOM are normal. Pupils are equal, round, and reactive to light. Right eye exhibits no discharge. Left eye exhibits no discharge. No scleral icterus.  Neck: Neck supple. No JVD present. No thyromegaly present.  Cardiovascular: Normal rate, regular rhythm, normal heart sounds and intact distal pulses.   No murmur heard. Pulmonary/Chest: Effort normal and breath sounds normal. No respiratory distress. She has no wheezes. She has no rales.  Breasts normal female  Abdominal: She exhibits no distension and no mass. There is no tenderness. There is no rebound and no guarding.  Genitourinary:  Genitourinary Comments: Deferred to GYN  Musculoskeletal: She exhibits no edema.  Lymphadenopathy:    She has no cervical adenopathy.  Neurological: She is alert and oriented to person, place, and time. She has normal reflexes. No cranial nerve deficit. Coordination normal.  Skin: Skin is warm and dry. No rash noted.  Psychiatric: She has a normal mood and affect. Her behavior is normal. Judgment and thought content normal.  Vitals reviewed.         Assessment & Plan:  Normal health  maintenance exam  Hyperlipidemia-restart statin medication  History of dependent edema-Since wearing compression hose and seen vascular surgeon no longer has significant dependent edema. Maxide discontinued  GE reflux  History of vitamin D deficiency  History of shellfish allergy  History of  ischemic colitis  Plan: Recommend diet exercise and weight loss. Return in 6 months for follow-up of hyperlipidemia.

## 2016-02-07 NOTE — Telephone Encounter (Signed)
A1C added

## 2016-02-08 LAB — HEMOGLOBIN A1C
Hgb A1c MFr Bld: 5.2 % (ref <5.7)
Mean Plasma Glucose: 103 mg/dL

## 2016-03-01 ENCOUNTER — Other Ambulatory Visit: Payer: Self-pay

## 2016-03-01 MED ORDER — ATORVASTATIN CALCIUM 20 MG PO TABS: ORAL_TABLET | ORAL | 1 refills | Status: DC

## 2016-03-03 NOTE — Patient Instructions (Signed)
It was a pleasure to see you today. Take 2000 units vitamin D 3 daily for vitamin D deficiency. Restart lipid-lowering medication and follow-up in 6 months. Flu vaccine given. Continue PPI

## 2016-04-06 ENCOUNTER — Encounter (HOSPITAL_BASED_OUTPATIENT_CLINIC_OR_DEPARTMENT_OTHER): Payer: Self-pay

## 2016-04-06 ENCOUNTER — Emergency Department (HOSPITAL_BASED_OUTPATIENT_CLINIC_OR_DEPARTMENT_OTHER)
Admission: EM | Admit: 2016-04-06 | Discharge: 2016-04-06 | Disposition: A | Discharge status: Home / Self Care | Payer: BLUE CROSS/BLUE SHIELD | Attending: Emergency Medicine | Admitting: Emergency Medicine

## 2016-04-06 DIAGNOSIS — Y929 Unspecified place or not applicable: Secondary | ICD-10-CM | POA: Insufficient documentation

## 2016-04-06 DIAGNOSIS — Z7982 Long term (current) use of aspirin: Secondary | ICD-10-CM | POA: Diagnosis not present

## 2016-04-06 DIAGNOSIS — Y999 Unspecified external cause status: Secondary | ICD-10-CM | POA: Insufficient documentation

## 2016-04-06 DIAGNOSIS — W260XXA Contact with knife, initial encounter: Secondary | ICD-10-CM | POA: Diagnosis not present

## 2016-04-06 DIAGNOSIS — Y939 Activity, unspecified: Secondary | ICD-10-CM | POA: Diagnosis not present

## 2016-04-06 DIAGNOSIS — S61214A Laceration without foreign body of right ring finger without damage to nail, initial encounter: Secondary | ICD-10-CM | POA: Diagnosis not present

## 2016-04-06 DIAGNOSIS — S68119A Complete traumatic metacarpophalangeal amputation of unspecified finger, initial encounter: Secondary | ICD-10-CM

## 2016-04-06 DIAGNOSIS — S6991XA Unspecified injury of right wrist, hand and finger(s), initial encounter: Secondary | ICD-10-CM | POA: Diagnosis present

## 2016-04-06 MED ORDER — FLUCONAZOLE 150 MG PO TABS: ORAL_TABLET | ORAL | 0 refills | Status: DC

## 2016-04-06 MED ORDER — CEPHALEXIN 250 MG PO CAPS
1000.0000 mg | ORAL_CAPSULE | Freq: Once | ORAL | Status: AC
  Administered 2016-04-06: 1000 mg via ORAL
  Filled 2016-04-06: qty 4

## 2016-04-06 MED ORDER — CEPHALEXIN 500 MG PO CAPS: 500.0000 mg | ORAL_CAPSULE | Freq: Four times a day (QID) | ORAL | 0 refills | Status: DC

## 2016-04-06 MED ORDER — LIDOCAINE HCL 2 % IJ SOLN
10.0000 mL | Freq: Once | INTRAMUSCULAR | Status: AC
  Administered 2016-04-06: 200 mg via INTRADERMAL
  Filled 2016-04-06: qty 20

## 2016-04-06 NOTE — ED Provider Notes (Signed)
[facility] Provider Note: Georgena Spurling, MD, FACEP  CSN: [id number] MRN: [id number] ARRIVAL: 04/06/16 at [place]  Laceration (finger)   HISTORY OF PRESENT ILLNESS  Julia Murray is a 60 y.o. female who was trying to retrieve a knife from her cabinet and fell lacerating her right ring fingertip. There is been moderate bleeding, controlled with pressure. There is moderate to severe pain, worse with palpation. The laceration is a flap. She denies any other injury. Her tetanus is up-to-date.   Past Medical History:  Diagnosis Date  . Anxiety   . Colitis 11/11   hx gi bleed ; no colitis since  . Deep vein blood clot of left lower extremity ([place])    saw Dr. Donnetta Hutching 12/17 , has clot behind l knee currently  . Dependent edema   . GERD (gastroesophageal reflux disease)   . History of blood transfusion 11/11    d/t acute colitis  . Hyperlipidemia   . Perimenopausal   . Vitamin D deficiency disease     Past Surgical History:  Procedure Laterality Date  . ANTERIOR CRUCIATE LIGAMENT REPAIR     left  . BREAST SURGERY     mutiple bx  . KNEE ARTHROSCOPY  05/20/2012   Procedure: ARTHROSCOPY KNEE;  Surgeon: Johnn Hai, MD;  Location: [facility];  Service: Orthopedics;  Laterality: Right;  right knee scope with incision and drainage and removal of loose foreign body   . KNEE ARTHROSCOPY WITH MEDIAL MENISECTOMY  04/25/2012   Procedure: KNEE ARTHROSCOPY WITH MEDIAL MENISECTOMY;  Surgeon: Johnn Hai, MD;  Location: [place];  Service: Orthopedics;  Laterality: Right;  Right Knee Arthrscopy with Partial Medial and Lateral Menisectomy with Removal of Loose Bodies  . TONSILLECTOMY      Family History  Problem Relation Age of Onset  . Heart disease Father   . Heart attack Father   . Diabetes Mother     Social History  Substance Use Topics  . Smoking status: Never Smoker  . Smokeless tobacco: Never  Used  . Alcohol use 3.0 oz/week    5 Shots of liquor per week    Prior to Admission medications   Medication Sig Start Date End Date Taking? Authorizing Provider  aspirin 325 MG tablet Take 325 mg by mouth daily.    Historical Provider, MD  atorvastatin (LIPITOR) 20 MG tablet TAKE 1 TABLET DAILY AT 6:00 P.M. 03/01/16   Elby Showers, MD  fluticasone (FLONASE) 50 MCG/ACT nasal spray Place 2 sprays into both nostrils daily. 11/18/15   Elby Showers, MD  lansoprazole (PREVACID) 15 MG capsule Take 15 mg by mouth daily at 12 noon.    Historical Provider, MD  meloxicam (MOBIC) 15 MG tablet Take 15 mg by mouth daily.    Historical Provider, MD  neomycin-polymyxin-hydrocortisone (CORTISPORIN) otic solution Place 4 drops into the right ear 4 (four) times daily. 03/01/15   Elby Showers, MD    Allergies Shellfish allergy   REVIEW OF SYSTEMS  Negative except as noted here or in the History of Present Illness.   PHYSICAL EXAMINATION  Initial Vital Signs Blood pressure 110/76, pulse 69, temperature 97.7 F (36.5 C), temperature source Oral, resp. rate 18, SpO2 96 %.  Examination General: Well-developed, well-nourished female in no acute distress; appearance consistent with age of record HENT: normocephalic; atraumatic Eyes: Normal appearance Neck: supple Heart: regular rate and rhythm Lungs: Normal respiratory effort and  excursion Abdomen: soft; nondistended Extremities: No deformity; full range of motion; flap laceration to tip of right ring finger Neurologic: Awake, alert and oriented; motor function intact in all extremities and symmetric; no facial droop Skin: Warm and dry Psychiatric: Normal mood and affect   RESULTS  Summary of this visit's results, reviewed by myself:   EKG Interpretation  Date/Time:    Ventricular Rate:    PR Interval:    QRS Duration:   QT Interval:    QTC Calculation:   R Axis:     Text Interpretation:        Laboratory Studies: No results found  for this or any previous visit (from the past 24 hour(s)). Imaging Studies: No results found.  ED COURSE  Nursing notes and initial vitals signs, including pulse oximetry, reviewed.  Vitals:   04/06/16 0031  BP: 110/76  Pulse: 69  Resp: 18  Temp: 97.7 F (36.5 C)  TempSrc: Oral  SpO2: 96%    PROCEDURES   LACERATION REPAIR Performed by: Wynetta Fines Authorized by: Wynetta Fines Consent: Verbal consent obtained. Risks and benefits: risks, benefits and alternatives were discussed Consent given by: patient Patient identity confirmed: provided demographic data Prepped and Draped in normal sterile fashion Wound explored  Laceration Location: Right ring fingertip  Laceration Length: 2 cm (flap circuference)  No Foreign Bodies seen or palpated  Anesthesia: local infiltration  Local anesthetic: lidocaine 2 % without epinephrine  Anesthetic total: 1.5 ml  Irrigation method: syringe Amount of cleaning: standard  Skin closure: 5-0 nylon   Number of sutures: 4   Technique: Simple interrupted   Patient tolerance: Patient tolerated the procedure well with no immediate complications.    Patient was advised that although the flap looks viable it may not "take" which will result in increased scarring.  ED DIAGNOSES     ICD-9-CM ICD-10-CM   1. Traumatic amputation of fingertip, initial encounter 886.0 [id number]        Shanon Rosser, MD 04/06/16 [phone]

## 2016-04-06 NOTE — ED Triage Notes (Signed)
Pt states cut her rt ring finger 4hrs ago, bleeding controlled at this time

## 2016-04-06 NOTE — ED Notes (Signed)
Vaseline gauze w finger tip dressing applied

## 2016-04-06 NOTE — ED Notes (Signed)
Lac to rt ring finger  4 hours ago  Bleeding controlled w pressure

## 2016-04-14 ENCOUNTER — Emergency Department (HOSPITAL_BASED_OUTPATIENT_CLINIC_OR_DEPARTMENT_OTHER)
Admission: EM | Admit: 2016-04-14 | Discharge: 2016-04-14 | Disposition: A | Discharge status: Home / Self Care | Payer: BLUE CROSS/BLUE SHIELD | Attending: Emergency Medicine | Admitting: Emergency Medicine

## 2016-04-14 ENCOUNTER — Encounter (HOSPITAL_BASED_OUTPATIENT_CLINIC_OR_DEPARTMENT_OTHER): Payer: Self-pay | Admitting: Emergency Medicine

## 2016-04-14 DIAGNOSIS — B379 Candidiasis, unspecified: Secondary | ICD-10-CM | POA: Diagnosis not present

## 2016-04-14 DIAGNOSIS — Z4802 Encounter for removal of sutures: Secondary | ICD-10-CM | POA: Diagnosis present

## 2016-04-14 DIAGNOSIS — Z7982 Long term (current) use of aspirin: Secondary | ICD-10-CM | POA: Diagnosis not present

## 2016-04-14 MED ORDER — FLUCONAZOLE 150 MG PO TABS: ORAL_TABLET | ORAL | 0 refills | Status: DC

## 2016-04-14 NOTE — ED Provider Notes (Signed)
[facility] [place] [facility]PT [facility] Provider Note   CSN: [id number] Arrival date & time: 04/14/16  [id number]     History   Chief Complaint Chief Complaint  Patient presents with  . Suture / Staple Removal    HPI Julia Murray is a 61 y.o. female.  HPI   60 year old female presents today for suture removal. Patient suffered a laceration to her right ring finger along the palmar aspect distally. She was seen in emergency room and had 4 sutures placed, she denies any signs of infection, reports taking antibiotics as directed, but notes that she had a yeast infection secondary to the antibiotic. Patient denies any signs of infection here today, no other complaints.   Past Medical History:  Diagnosis Date  . Anxiety   . Colitis 11/11   hx gi bleed ; no colitis since  . Deep vein blood clot of left lower extremity ([place])    saw Dr. Donnetta Hutching 12/17 , has clot behind l knee currently  . Dependent edema   . GERD (gastroesophageal reflux disease)   . History of blood transfusion 11/11    d/t acute colitis  . Hyperlipidemia   . Perimenopausal   . Vitamin D deficiency disease     Patient Active Problem List   Diagnosis Date Noted  . DJD (degenerative joint disease) of knee 05/20/2012  . GE reflux 04/04/2012  . Hyperlipidemia 04/04/2012  . Anxiety 04/04/2012  . History of vitamin D deficiency 04/04/2012  . ISCHEMIC COLITIS 04/12/2008    Past Surgical History:  Procedure Laterality Date  . ANTERIOR CRUCIATE LIGAMENT REPAIR     left  . BREAST SURGERY     mutiple bx  . KNEE ARTHROSCOPY  05/20/2012   Procedure: ARTHROSCOPY KNEE;  Surgeon: Johnn Hai, MD;  Location: [facility];  Service: Orthopedics;  Laterality: Right;  right knee scope with incision and drainage and removal of loose foreign body   . KNEE ARTHROSCOPY WITH MEDIAL MENISECTOMY  04/25/2012   Procedure: KNEE ARTHROSCOPY WITH MEDIAL MENISECTOMY;  Surgeon: Johnn Hai, MD;  Location: [facility];  Service: Orthopedics;  Laterality: Right;  Right Knee Arthrscopy with Partial Medial and Lateral Menisectomy with Removal of Loose Bodies  . TONSILLECTOMY      OB History    No data available       Home Medications    Prior to Admission medications   Medication Sig Start Date End Date Taking? Authorizing Provider  aspirin 325 MG tablet Take 325 mg by mouth daily.    Historical Provider, MD  atorvastatin (LIPITOR) 20 MG tablet TAKE 1 TABLET DAILY AT 6:00 P.M. 03/01/16   Elby Showers, MD  cephALEXin (KEFLEX) 500 MG capsule Take 1 capsule (500 mg total) by mouth 4 (four) times daily. 04/06/16   John Molpus, MD  fluconazole (DIFLUCAN) 150 MG tablet Take one tablet as needed for vaginal yeast infection. May repeat in 3 days if needed. 04/14/16   Dellis Filbert Jantz Main, PA-C  fluticasone (FLONASE) 50 MCG/ACT nasal spray Place 2 sprays into both nostrils daily. 11/18/15   Elby Showers, MD  lansoprazole (PREVACID) 15 MG capsule Take 15 mg by mouth daily at 12 noon.    Historical Provider, MD  meloxicam (MOBIC) 15 MG tablet Take 15 mg by mouth daily.    Historical Provider, MD  neomycin-polymyxin-hydrocortisone (CORTISPORIN) otic solution Place 4 drops into the right ear 4 (four) times daily. 03/01/15   Elby Showers, MD    Family  History Family History  Problem Relation Age of Onset  . Heart disease Father   . Heart attack Father   . Diabetes Mother     Social History Social History  Substance Use Topics  . Smoking status: Never Smoker  . Smokeless tobacco: Never Used  . Alcohol use 3.0 oz/week    5 Shots of liquor per week     Allergies   Shellfish allergy   Review of Systems Review of Systems  All other systems reviewed and are negative.    Physical Exam Updated Vital Signs BP 136/74 (BP Location: Right Arm)   Pulse 75   Temp 98.1 F (36.7 C) (Oral)   Resp 16   SpO2 96%   Physical Exam  Constitutional: She is oriented to person, place, and time. She appears  well-developed and well-nourished.  HENT:  Head: Normocephalic and atraumatic.  Eyes: Conjunctivae are normal. Pupils are equal, round, and reactive to light. Right eye exhibits no discharge. Left eye exhibits no discharge. No scleral icterus.  Neck: Normal range of motion. No JVD present. No tracheal deviation present.  Pulmonary/Chest: Effort normal. No stridor.  Musculoskeletal:  Right ring finger with partial nail avulsion. Patient's flap appears to have taken for the most part, some of the surrounding edges are not approximated, no signs of surrounding infection  Neurological: She is alert and oriented to person, place, and time. Coordination normal.  Psychiatric: She has a normal mood and affect. Her behavior is normal. Judgment and thought content normal.  Nursing note and vitals reviewed.    ED Treatments / Results  Labs (all labs ordered are listed, but only abnormal results are displayed) Labs Reviewed - No data to display  EKG  EKG Interpretation None       Radiology No results found.  Procedures Procedures (including critical care time)  Medications Ordered in ED Medications - No data to display   Initial Impression / Assessment and Plan / ED Course  I have reviewed the triage vital signs and the nursing notes.  Pertinent labs & imaging results that were available during my care of the patient were reviewed by me and considered in my medical decision making (see chart for details).  Clinical Course      Final Clinical Impressions(s) / ED Diagnoses   Final diagnoses:  Visit for suture removal  Yeast infection    Labs:  Imaging:  Consults:  Therapeutics:  Discharge Meds:   Assessment/Plan:   Patient presents for suture removal. It appears her skin flap has approximated fairly well with some surrounding areas that did not approximate. Patient has no signs of infectious etiology she is instructed to continue to use Neosporin, avoid trauma to  the finger. Patient reports having [place] infection secondary to antibiotics. She will be given fluconazole. Dr. precautions given. She verbalized understanding and agreement to today's plan had no further questions or concerns the time discharge   New Prescriptions Current Discharge Medication List       Okey Regal, PA-C 04/14/16 [place], MD 04/14/16 1340

## 2016-04-14 NOTE — Discharge Instructions (Signed)
Please read attached information. If you experience any new or worsening signs or symptoms please return to the emergency room for evaluation. Please follow-up with your primary care provider or specialist as discussed. Please use medication prescribed only as directed and discontinue taking if you have any concerning signs or symptoms.   °

## 2016-04-14 NOTE — ED Triage Notes (Signed)
Pt here for suture removal to R ring finger

## 2016-05-17 ENCOUNTER — Other Ambulatory Visit: Payer: BLUE CROSS/BLUE SHIELD | Admitting: Internal Medicine

## 2016-05-18 ENCOUNTER — Other Ambulatory Visit: Payer: Managed Care, Other (non HMO) | Admitting: Internal Medicine

## 2016-05-18 DIAGNOSIS — E785 Hyperlipidemia, unspecified: Secondary | ICD-10-CM

## 2016-05-18 LAB — LIPID PANEL
CHOL/HDL RATIO: 2.2 ratio (ref <5.0)
Cholesterol: 226 mg/dL — ABNORMAL HIGH (ref <200)
HDL: 103 mg/dL (ref >50)
LDL Cholesterol: 95 mg/dL (ref <100)
Triglycerides: 141 mg/dL (ref <150)
VLDL: 28 mg/dL (ref <30)

## 2016-05-18 LAB — HEPATIC FUNCTION PANEL
ALBUMIN: 4.4 g/dL (ref 3.6–5.1)
ALK PHOS: 56 U/L (ref 33–130)
ALT: 19 U/L (ref 6–29)
AST: 17 U/L (ref 10–35)
BILIRUBIN TOTAL: 0.5 mg/dL (ref 0.2–1.2)
Bilirubin, Direct: 0.1 mg/dL (ref <=0.2)
Indirect Bilirubin: 0.4 mg/dL (ref 0.2–1.2)
TOTAL PROTEIN: 7.3 g/dL (ref 6.1–8.1)

## 2016-06-06 ENCOUNTER — Telehealth: Payer: Self-pay | Admitting: Internal Medicine

## 2016-06-06 MED ORDER — FLUTICASONE PROPIONATE 50 MCG/ACT NA SUSP: 2.0000 | Freq: Every day | NASAL | 4 refills | Status: DC

## 2016-06-06 NOTE — Telephone Encounter (Addendum)
Patient has new insurance for this year and therefore has a new mail order.  She would like to come by tomorrow afternoon and pick up a prescription for her Flonase so that she can mail off the prescription to her mail order pharmacy.  Offered to do this for the patient, but she wants to pick up the prescription and do it herself.  She'll plan to come after lunch.    Written prescription printed as requested

## 2016-07-30 ENCOUNTER — Telehealth: Payer: Self-pay

## 2016-07-30 NOTE — Telephone Encounter (Signed)
Patient called to request a prescription for the shingles. Thank you.

## 2016-07-30 NOTE — Telephone Encounter (Signed)
Done

## 2016-07-30 NOTE — Telephone Encounter (Signed)
Do you mean shingles vaccine?

## 2016-08-21 ENCOUNTER — Other Ambulatory Visit: Payer: Managed Care, Other (non HMO) | Admitting: Internal Medicine

## 2016-08-21 DIAGNOSIS — Z79899 Other long term (current) drug therapy: Secondary | ICD-10-CM

## 2016-08-21 DIAGNOSIS — E785 Hyperlipidemia, unspecified: Secondary | ICD-10-CM

## 2016-08-21 LAB — HEPATIC FUNCTION PANEL
ALK PHOS: 59 U/L (ref 33–130)
ALT: 36 U/L — ABNORMAL HIGH (ref 6–29)
AST: 28 U/L (ref 10–35)
Albumin: 4.2 g/dL (ref 3.6–5.1)
BILIRUBIN DIRECT: 0.1 mg/dL (ref <=0.2)
BILIRUBIN INDIRECT: 0.4 mg/dL (ref 0.2–1.2)
TOTAL PROTEIN: 6.8 g/dL (ref 6.1–8.1)
Total Bilirubin: 0.5 mg/dL (ref 0.2–1.2)

## 2016-08-21 LAB — LIPID PANEL
CHOL/HDL RATIO: 2.2 ratio (ref <5.0)
Cholesterol: 204 mg/dL — ABNORMAL HIGH (ref <200)
HDL: 92 mg/dL (ref >50)
LDL CALC: 80 mg/dL (ref <100)
Triglycerides: 159 mg/dL — ABNORMAL HIGH (ref <150)
VLDL: 32 mg/dL — AB (ref <30)

## 2016-08-23 ENCOUNTER — Ambulatory Visit (INDEPENDENT_AMBULATORY_CARE_PROVIDER_SITE_OTHER): Payer: Managed Care, Other (non HMO) | Admitting: Internal Medicine

## 2016-08-23 ENCOUNTER — Encounter: Payer: Self-pay | Admitting: Internal Medicine

## 2016-08-23 VITALS — BP 120/84 | HR 82 | Temp 98.6°F | Ht 65.0 in | Wt 190.0 lb

## 2016-08-23 DIAGNOSIS — E782 Mixed hyperlipidemia: Secondary | ICD-10-CM | POA: Diagnosis not present

## 2016-08-23 DIAGNOSIS — K219 Gastro-esophageal reflux disease without esophagitis: Secondary | ICD-10-CM | POA: Diagnosis not present

## 2016-08-23 NOTE — Progress Notes (Signed)
   Subjective:    Patient ID: Julia Murray, female    DOB: 1955/07/09, 61 y.o.   MRN: [id number]  HPI 61 year old Julia Murray today to follow-up on hyperlipidemia and GE reflux. She is on cholesterol-lowering medication. Total cholesterol was 226 3 months ago and is now 204. Triglycerides are 159 and previously were 141 3 months ago. LDL cholesterol has decreased from 95-80. She has a high HDL cholesterol.  She asked about colonoscopy. She had colonoscopy done [facility] 2009. She had presented with bloody diarrhea and was felt to have ischemic colitis. Biopsies were taken. See report.  Not due for colonoscopy until 2019.   Hx ischemic colitis Review of Systems     Objective:   Physical Exam not examined. But 20 minutes speaking with her about these issues.    Assessment & Plan:  Hyperlipidemia-currently a Lipitor 20 mg daily. Continue to watch diet and try to exercise.  GE reflux treated with PPI  History of ischemic colitis-not due for colonoscopy until 2019  Plan: Continue same medications and return in 6 months for physical examination.

## 2016-09-01 NOTE — Patient Instructions (Signed)
It was a pleasure to see you today. Continue same medications return in 6 months for physical examination. Continue diet and exercise efforts.

## 2016-09-08 ENCOUNTER — Other Ambulatory Visit: Payer: Self-pay | Admitting: Internal Medicine

## 2016-09-24 ENCOUNTER — Ambulatory Visit: Payer: Managed Care, Other (non HMO) | Admitting: Internal Medicine

## 2016-12-15 ENCOUNTER — Other Ambulatory Visit: Payer: Self-pay | Admitting: Internal Medicine

## 2017-02-28 ENCOUNTER — Telehealth: Payer: Self-pay | Admitting: Internal Medicine

## 2017-02-28 MED ORDER — MELOXICAM 15 MG PO TABS: 15.0000 mg | ORAL_TABLET | Freq: Every day | ORAL | 0 refills | Status: DC

## 2017-02-28 NOTE — Telephone Encounter (Signed)
Pt is aware.  

## 2017-02-28 NOTE — Telephone Encounter (Signed)
We can do thirty then she will need OV

## 2017-02-28 NOTE — Telephone Encounter (Signed)
Patient is calling to request a refill on her Meloxicam 30mg .  States that she has normally had Dr. Tonita Cong fill this in the past.  However, she hasn't seen him in about a year.  And, when she called him for a refill this morning, they recommended that she call her PCP because you draw her labs on a regular basis.  So, they recommend that you refill this for her going forward.    So, she wants to know if you will refill this for her because she is hurting pretty bad with the cold weather change.    Pharmacy:  She would like about #10 sent to local pharmacy - [facility].  Then she would like the main Rx sent to [facility].    Best # for contact:  [phone]  Thank you.

## 2017-03-12 ENCOUNTER — Other Ambulatory Visit: Payer: Self-pay | Admitting: Internal Medicine

## 2017-03-12 NOTE — Telephone Encounter (Signed)
Last seen in April was told then needed cpe in 6 months.Please call her. Do not see appt in Epic

## 2017-03-20 ENCOUNTER — Telehealth: Payer: Self-pay

## 2017-03-20 MED ORDER — ATORVASTATIN CALCIUM 20 MG PO TABS: ORAL_TABLET | ORAL | 0 refills | Status: DC

## 2017-03-20 NOTE — Telephone Encounter (Signed)
Made appointment but was out of Lipitor gave 30 days only to last until appt.

## 2017-04-02 ENCOUNTER — Ambulatory Visit (INDEPENDENT_AMBULATORY_CARE_PROVIDER_SITE_OTHER): Payer: Managed Care, Other (non HMO) | Admitting: Internal Medicine

## 2017-04-02 VITALS — BP 120/80 | HR 68 | Temp 98.1°F | Ht 65.0 in | Wt 196.0 lb

## 2017-04-02 DIAGNOSIS — Z8639 Personal history of other endocrine, nutritional and metabolic disease: Secondary | ICD-10-CM

## 2017-04-02 DIAGNOSIS — E559 Vitamin D deficiency, unspecified: Secondary | ICD-10-CM | POA: Diagnosis not present

## 2017-04-02 DIAGNOSIS — M7918 Myalgia, other site: Secondary | ICD-10-CM

## 2017-04-02 DIAGNOSIS — Z23 Encounter for immunization: Secondary | ICD-10-CM | POA: Diagnosis not present

## 2017-04-02 DIAGNOSIS — Z0000 Encounter for general adult medical examination without abnormal findings: Secondary | ICD-10-CM

## 2017-04-02 DIAGNOSIS — K219 Gastro-esophageal reflux disease without esophagitis: Secondary | ICD-10-CM

## 2017-04-02 DIAGNOSIS — E785 Hyperlipidemia, unspecified: Secondary | ICD-10-CM | POA: Diagnosis not present

## 2017-04-02 DIAGNOSIS — Z1329 Encounter for screening for other suspected endocrine disorder: Secondary | ICD-10-CM

## 2017-04-02 MED ORDER — ATORVASTATIN CALCIUM 20 MG PO TABS: ORAL_TABLET | ORAL | 2 refills | Status: DC

## 2017-04-02 MED ORDER — FLUTICASONE PROPIONATE 50 MCG/ACT NA SUSP: 2.0000 | Freq: Every day | NASAL | 5 refills | Status: DC

## 2017-04-02 NOTE — Progress Notes (Signed)
   Subjective:    Patient ID: Julia Murray, female    DOB: Aug 24, 1955, 61 y.o.   MRN: [id number]  HPI She called in October about refill of meloxicam.  Dr. Tonita Cong has been giving it to her.  We said we would give her 30 days and then she would need to come in for office visit.  She was last seen here in April and is past due for physical examination.  Last had physical exam February 07, 2016.  Discussed this with her today.  She thought she should have received a notice by mail.  I have asked her to stay on top of this in the near future.  There continues to be situational stress with stepson who is an alcoholic and lives with his mother.  Patient has been to Al-Anon to get over angry  She has a history of hyperlipidemia and GE reflux.  She is on cholesterol-lowering medication.  Due for colonoscopy in 2019.  She is fasting today we decided to proceed with fasting lab work in anticipation of physical exam after the first of the year.    Review of Systems See above    Objective:   Physical Exam  Not examined but spent 20 minutes speaking with her about these issues      Assessment & Plan:  Hyperlipidemia  Situational stress  Musculoskeletal pain  Health maintenance-reminded about health maintenance flu vaccine given.  Allergic rhinitis-Flonase refilled

## 2017-04-02 NOTE — Progress Notes (Signed)
   Subjective:    Patient ID: Julia Murray, female    DOB: 07-23-1955, 61 y.o.   MRN: [id number]  HPI    Review of Systems     Objective:   Physical Exam        Assessment & Plan:

## 2017-04-03 ENCOUNTER — Encounter: Payer: Self-pay | Admitting: Internal Medicine

## 2017-04-03 LAB — CBC WITH DIFFERENTIAL/PLATELET
BASOS ABS: 21 {cells}/uL (ref 0–200)
Basophils Relative: 0.4 %
EOS PCT: 3.2 %
Eosinophils Absolute: 170 cells/uL (ref 15–500)
HCT: 39.9 % (ref 35.0–45.0)
Hemoglobin: 13.5 g/dL (ref 11.7–15.5)
Lymphs Abs: 2115 cells/uL (ref 850–3900)
MCH: 28.9 pg (ref 27.0–33.0)
MCHC: 33.8 g/dL (ref 32.0–36.0)
MCV: 85.4 fL (ref 80.0–100.0)
MONOS PCT: 8 %
MPV: 10 fL (ref 7.5–12.5)
NEUTROS PCT: 48.5 %
Neutro Abs: 2571 cells/uL (ref 1500–7800)
PLATELETS: 274 10*3/uL (ref 140–400)
RBC: 4.67 10*6/uL (ref 3.80–5.10)
RDW: 13.9 % (ref 11.0–15.0)
Total Lymphocyte: 39.9 %
WBC mixed population: 424 cells/uL (ref 200–950)
WBC: 5.3 10*3/uL (ref 3.8–10.8)

## 2017-04-03 LAB — COMPLETE METABOLIC PANEL WITH GFR
AG Ratio: 1.7 (calc) (ref 1.0–2.5)
ALBUMIN MSPROF: 4.6 g/dL (ref 3.6–5.1)
ALKALINE PHOSPHATASE (APISO): 75 U/L (ref 33–130)
ALT: 37 U/L — ABNORMAL HIGH (ref 6–29)
AST: 23 U/L (ref 10–35)
BILIRUBIN TOTAL: 0.7 mg/dL (ref 0.2–1.2)
BUN: 22 mg/dL (ref 7–25)
CHLORIDE: 101 mmol/L (ref 98–110)
CO2: 27 mmol/L (ref 20–32)
CREATININE: 0.6 mg/dL (ref 0.50–0.99)
Calcium: 9.9 mg/dL (ref 8.6–10.4)
GFR, Est African American: 113 mL/min/{1.73_m2} (ref >=60)
GFR, Est Non African American: 98 mL/min/{1.73_m2} (ref >=60)
GLOBULIN: 2.7 g/dL (ref 1.9–3.7)
GLUCOSE: 89 mg/dL (ref 65–99)
Potassium: 4.3 mmol/L (ref 3.5–5.3)
SODIUM: 137 mmol/L (ref 135–146)
Total Protein: 7.3 g/dL (ref 6.1–8.1)

## 2017-04-03 LAB — LIPID PANEL
CHOL/HDL RATIO: 3.2 (calc) (ref <5.0)
Cholesterol: 304 mg/dL — ABNORMAL HIGH (ref <200)
HDL: 96 mg/dL (ref >50)
LDL CHOLESTEROL (CALC): 172 mg/dL — AB
Non-HDL Cholesterol (Calc): 208 mg/dL (calc) — ABNORMAL HIGH (ref <130)
TRIGLYCERIDES: 196 mg/dL — AB (ref <150)

## 2017-04-03 LAB — VITAMIN D 25 HYDROXY (VIT D DEFICIENCY, FRACTURES): VIT D 25 HYDROXY: 29 ng/mL — AB (ref 30–100)

## 2017-04-03 LAB — TSH: TSH: 1.91 m[IU]/L (ref 0.40–4.50)

## 2017-04-03 MED ORDER — ATORVASTATIN CALCIUM 20 MG PO TABS: ORAL_TABLET | ORAL | 1 refills | Status: DC

## 2017-04-03 MED ORDER — FLUTICASONE PROPIONATE 50 MCG/ACT NA SUSP: 2.0000 | Freq: Every day | NASAL | 5 refills | Status: DC

## 2017-04-03 MED ORDER — MELOXICAM 15 MG PO TABS: 15.0000 mg | ORAL_TABLET | Freq: Every day | ORAL | 1 refills | Status: DC

## 2017-04-03 MED ORDER — LANSOPRAZOLE 15 MG PO CPDR: 15.0000 mg | DELAYED_RELEASE_CAPSULE | Freq: Every day | ORAL | 0 refills | Status: DC

## 2017-04-26 ENCOUNTER — Encounter (INDEPENDENT_AMBULATORY_CARE_PROVIDER_SITE_OTHER): Payer: Self-pay

## 2017-04-26 DIAGNOSIS — I868 Varicose veins of other specified sites: Secondary | ICD-10-CM

## 2017-06-20 ENCOUNTER — Other Ambulatory Visit: Payer: Self-pay | Admitting: Internal Medicine

## 2017-06-25 ENCOUNTER — Encounter: Payer: Managed Care, Other (non HMO) | Admitting: Internal Medicine

## 2017-07-09 ENCOUNTER — Encounter: Payer: Self-pay | Admitting: Internal Medicine

## 2017-07-09 ENCOUNTER — Ambulatory Visit (INDEPENDENT_AMBULATORY_CARE_PROVIDER_SITE_OTHER): Payer: Managed Care, Other (non HMO) | Admitting: Internal Medicine

## 2017-07-09 VITALS — BP 120/70 | HR 78 | Ht 64.25 in | Wt 196.0 lb

## 2017-07-09 DIAGNOSIS — Z0000 Encounter for general adult medical examination without abnormal findings: Secondary | ICD-10-CM | POA: Diagnosis not present

## 2017-07-09 DIAGNOSIS — E785 Hyperlipidemia, unspecified: Secondary | ICD-10-CM | POA: Diagnosis not present

## 2017-07-09 DIAGNOSIS — K219 Gastro-esophageal reflux disease without esophagitis: Secondary | ICD-10-CM

## 2017-07-09 DIAGNOSIS — Z8639 Personal history of other endocrine, nutritional and metabolic disease: Secondary | ICD-10-CM

## 2017-07-09 NOTE — Progress Notes (Signed)
Subjective:    Patient ID: Julia Murray, female    DOB: [birth year], 62 y.o.   MRN: [id number]  HPI  62 year old for health maintenance exam and evaluation of medical issues.  History of GE reflux, hyperlipidemia, vitamin D deficiency and osteoarthritis of her knees  Yesterday, had 2 steroid injections in knees per Dr. Tonita Cong.  4 months ago total cholesterol was 304 and is now 230.  LDL cholesterol has improved from 172 to 102.  Triglycerides improved from 196 to 97.  In November 2018 her vitamin D level was low at 29 and she was advised to take over-the-counter vitamin D.  TSH is normal.  History of anxiety.  December 2009 she had a bout of ischemic colitis and was hospitalized in [facility].  This was left-sided ischemic colitis.  Was treated with Flagyl and Levaquin.  She required a blood transfusion.  Biopsies were compatible with ischemic colitis.  Symptoms started with a sudden bout of abdominal pain after returning from eating a steak dinner.  She never had a recurrence.  She was seen by Dr. Fuller Plan in follow-up and last colonoscopy was 2011.  No known drug allergies.  Fractured hand in 1980s.  Torn anterior cruciate ligament 2007.  GYN is Dr. Willis Modena.  History of right knee arthroscopic surgery by Dr. being in December 2013.  She had significant anterior cruciate ligament disease, bilateral mid menisci tears and loose bodies.  Subsequently developed infection postoperatively and had to be readmitted January 2014 for IV antibiotics, right knee drainage and wound dehiscence.  History of dependent edema for which she is taking Maxide.  GE reflux treated with PPI.  Has been on Lipitor since February 2012.  Says Crestor caused constipation.  Social history: She is married.  Husband is Environmental manager.  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  Completed 2 years of college.  Does not smoke.  Social  alcohol consumption.  She has 2 adult children a daughter and a son.  Has 1 grandchild.  Family history: Father died at age 12 of heart failure.  Mother with history of diabetes.  One brother in good health.  Father had gout, kidney stones and ulcers in addition to heart disease.        Review of Systems history of anxiety.  Has knee pain.     Objective:   Physical Exam  Constitutional: She is oriented to person, place, and time. She appears well-developed and well-nourished. No distress.  HENT:  Head: Normocephalic and atraumatic.  Right Ear: External ear normal.  Left Ear: External ear normal.  Mouth/Throat: Oropharynx is clear and moist. No oropharyngeal exudate.  Eyes: Right eye exhibits no discharge. Left eye exhibits no discharge. No scleral icterus.  Neck: Neck supple. No JVD present. No thyromegaly present.  Cardiovascular: Normal rate and normal heart sounds.  No murmur heard. Pulmonary/Chest: No respiratory distress. She has no wheezes. She has no rales.  Breasts normal female without masses  Abdominal: Soft. Bowel sounds are normal. She exhibits no distension and no mass. There is no tenderness. There is no rebound and no guarding.  Genitourinary:  Genitourinary Comments: Deferred to GYN  Musculoskeletal: She exhibits no edema.  Neurological: She is alert and oriented to person, place, and time. She has normal reflexes. No cranial nerve deficit. Coordination normal.  Skin: Skin is warm and dry. No rash noted. She is  not diaphoretic.  Psychiatric: She has a normal mood and affect. Her behavior is normal. Judgment and thought content normal.  Vitals reviewed.         Assessment & Plan:  Hyperlipidemia-stable with statin medication  History of dependent edema.  Wears compression hose and has seen vascular surgeon.  This is resolved.  Maxzide has been discontinued.  GE reflux treated with PPI  History of ischemic colitis with no recurrence since initial  episode  History of shellfish allergy  History of vitamin D deficiency-reminded to take 2000 units vitamin D3 daily  Bilateral knee pain with recent knee injections by orthopedist likely has osteoarthritis  Plan: Return in 6 months and continue same medications.  Will need lipid panel at that time along with liver functions.  As well as vitamin D level.

## 2017-07-17 ENCOUNTER — Other Ambulatory Visit: Payer: Self-pay | Admitting: Internal Medicine

## 2017-07-17 DIAGNOSIS — E785 Hyperlipidemia, unspecified: Secondary | ICD-10-CM

## 2017-07-17 DIAGNOSIS — Z79899 Other long term (current) drug therapy: Secondary | ICD-10-CM

## 2017-07-18 ENCOUNTER — Other Ambulatory Visit: Payer: Managed Care, Other (non HMO) | Admitting: Internal Medicine

## 2017-07-18 DIAGNOSIS — Z79899 Other long term (current) drug therapy: Secondary | ICD-10-CM

## 2017-07-18 DIAGNOSIS — E785 Hyperlipidemia, unspecified: Secondary | ICD-10-CM

## 2017-07-18 LAB — HEPATIC FUNCTION PANEL
AG RATIO: 1.9 (calc) (ref 1.0–2.5)
ALT: 28 U/L (ref 6–29)
AST: 21 U/L (ref 10–35)
Albumin: 4.3 g/dL (ref 3.6–5.1)
Alkaline phosphatase (APISO): 78 U/L (ref 33–130)
Bilirubin, Direct: 0.1 mg/dL (ref 0.0–0.2)
GLOBULIN: 2.3 g/dL (ref 1.9–3.7)
Indirect Bilirubin: 0.4 mg/dL (calc) (ref 0.2–1.2)
TOTAL PROTEIN: 6.6 g/dL (ref 6.1–8.1)
Total Bilirubin: 0.5 mg/dL (ref 0.2–1.2)

## 2017-07-18 LAB — LIPID PANEL
Cholesterol: 230 mg/dL — ABNORMAL HIGH (ref <200)
HDL: 109 mg/dL (ref >50)
LDL Cholesterol (Calc): 102 mg/dL (calc) — ABNORMAL HIGH
Non-HDL Cholesterol (Calc): 121 mg/dL (calc) (ref <130)
Total CHOL/HDL Ratio: 2.1 (calc) (ref <5.0)
Triglycerides: 97 mg/dL (ref <150)

## 2017-08-03 NOTE — Patient Instructions (Signed)
Continue statin medication.  Take 2000 units vitamin D3 daily and continue PPI.  Return in 6 months.

## 2017-09-10 ENCOUNTER — Other Ambulatory Visit: Payer: Self-pay | Admitting: Internal Medicine

## 2017-09-10 DIAGNOSIS — M7918 Myalgia, other site: Secondary | ICD-10-CM

## 2017-09-18 ENCOUNTER — Other Ambulatory Visit: Payer: Self-pay | Admitting: Internal Medicine

## 2017-09-30 ENCOUNTER — Other Ambulatory Visit: Payer: Self-pay | Admitting: Internal Medicine

## 2017-10-01 ENCOUNTER — Other Ambulatory Visit: Payer: Self-pay | Admitting: Internal Medicine

## 2017-10-03 ENCOUNTER — Ambulatory Visit: Payer: Self-pay | Admitting: Internal Medicine

## 2017-12-17 ENCOUNTER — Other Ambulatory Visit: Payer: Self-pay | Admitting: Internal Medicine

## 2018-03-12 ENCOUNTER — Other Ambulatory Visit: Payer: Self-pay | Admitting: Internal Medicine

## 2018-03-12 DIAGNOSIS — M7918 Myalgia, other site: Secondary | ICD-10-CM

## 2018-03-17 ENCOUNTER — Ambulatory Visit (INDEPENDENT_AMBULATORY_CARE_PROVIDER_SITE_OTHER): Payer: Managed Care, Other (non HMO) | Admitting: Internal Medicine

## 2018-03-17 ENCOUNTER — Encounter: Payer: Self-pay | Admitting: Internal Medicine

## 2018-03-17 VITALS — BP 110/80 | HR 78 | Temp 98.6°F | Ht 64.25 in | Wt 193.0 lb

## 2018-03-17 DIAGNOSIS — M545 Low back pain, unspecified: Secondary | ICD-10-CM

## 2018-03-17 DIAGNOSIS — M7918 Myalgia, other site: Secondary | ICD-10-CM

## 2018-03-17 DIAGNOSIS — R350 Frequency of micturition: Secondary | ICD-10-CM

## 2018-03-17 LAB — POCT URINALYSIS DIPSTICK
APPEARANCE: NEGATIVE
Bilirubin, UA: NEGATIVE
Blood, UA: NEGATIVE
GLUCOSE UA: NEGATIVE
Ketones, UA: NEGATIVE
LEUKOCYTES UA: NEGATIVE
NITRITE UA: NEGATIVE
ODOR: NEGATIVE
PROTEIN UA: POSITIVE — AB
SPEC GRAV UA: 1.015 (ref 1.010–1.025)
Urobilinogen, UA: 0.2 E.U./dL
pH, UA: 6.5 (ref 5.0–8.0)

## 2018-03-17 MED ORDER — CYCLOBENZAPRINE HCL 10 MG PO TABS: ORAL_TABLET | ORAL | 0 refills | Status: DC

## 2018-03-17 MED ORDER — MELOXICAM 15 MG PO TABS: 15.0000 mg | ORAL_TABLET | Freq: Every day | ORAL | 0 refills | Status: DC

## 2018-03-17 NOTE — Patient Instructions (Signed)
Flexeril 10 mg take 1/2 tablet at bedtime.  Meloxicam 15 mg daily for 7 to 10 days.  If symptoms not improving, we will refer you to physical therapy.  Urine dipstick is normal.  May want to sleep with pillow between knees if you are on your side or under your knees if you are lying on your back.

## 2018-03-17 NOTE — Progress Notes (Signed)
   Subjective:    Patient ID: Julia Murray, female    DOB: 06/24/1955, 62 y.o.   MRN: [id number]  HPI Patient and her husband went out with friends on Friday, November 8.  They had dinner.  She came home and went to bed and wake in the next day with low back pain.  Pain does not radiate into her legs.  There is no weakness or numbness in the legs.  Pain initially it appeared to be bilateral in the lower back.  She has no urinary frequency or dysuria.  Now says that pain is centered more in the left lower back.  Seems to have gotten a bit better today she says.    Review of Systems no prior history of back issues     Objective:   Physical Exam Straight leg raising is negative bilaterally at 90 degrees.  Deep tendon reflexes 2+ and symmetrical in the knees.  Muscle strength is normal in the lower extremities.  She is moving slowly and carefully.  She is tender over her left posterior superior iliac spine area and slightly above that.       Assessment & Plan:  Bilateral back pain without sciatica  Plan: Meloxicam 15 mg daily.  Flexeril 10 mg to take 1/2 tablet 1 hour before going to bed.  May want to place pillows under knees if on back and between these if lying on side.  If not improving in several days we will consider physical therapy.  Generally plain x-rays do not help with diagnosis of acute low back pain.  Conservative therapy indicated.

## 2018-03-29 ENCOUNTER — Other Ambulatory Visit: Payer: Self-pay | Admitting: Internal Medicine

## 2018-04-24 ENCOUNTER — Other Ambulatory Visit: Payer: Self-pay | Admitting: Internal Medicine

## 2018-04-24 DIAGNOSIS — M7918 Myalgia, other site: Secondary | ICD-10-CM

## 2018-06-04 ENCOUNTER — Other Ambulatory Visit: Payer: Self-pay | Admitting: Internal Medicine

## 2018-06-04 DIAGNOSIS — M7918 Myalgia, other site: Secondary | ICD-10-CM

## 2018-07-08 ENCOUNTER — Other Ambulatory Visit: Payer: Self-pay | Admitting: Internal Medicine

## 2018-07-08 DIAGNOSIS — M7918 Myalgia, other site: Secondary | ICD-10-CM

## 2018-07-10 ENCOUNTER — Ambulatory Visit: Payer: BLUE CROSS/BLUE SHIELD | Admitting: Internal Medicine

## 2018-07-10 ENCOUNTER — Encounter: Payer: Self-pay | Admitting: Internal Medicine

## 2018-07-10 VITALS — BP 110/70 | HR 60 | Temp 98.4°F | Ht 64.25 in | Wt 197.0 lb

## 2018-07-10 DIAGNOSIS — J22 Unspecified acute lower respiratory infection: Secondary | ICD-10-CM | POA: Diagnosis not present

## 2018-07-10 MED ORDER — BENZONATATE 100 MG PO CAPS: 100.0000 mg | ORAL_CAPSULE | Freq: Two times a day (BID) | ORAL | 0 refills | Status: DC | PRN

## 2018-07-10 MED ORDER — AZITHROMYCIN 250 MG PO TABS: ORAL_TABLET | ORAL | 0 refills | Status: DC

## 2018-07-11 ENCOUNTER — Other Ambulatory Visit: Payer: Self-pay

## 2018-07-11 MED ORDER — FLUTICASONE PROPIONATE 50 MCG/ACT NA SUSP: 2.0000 | Freq: Every day | NASAL | 12 refills | Status: DC

## 2018-07-29 ENCOUNTER — Other Ambulatory Visit: Payer: Self-pay

## 2018-07-29 MED ORDER — ATORVASTATIN CALCIUM 20 MG PO TABS: ORAL_TABLET | ORAL | 0 refills | Status: DC

## 2018-07-30 ENCOUNTER — Telehealth: Payer: Self-pay | Admitting: Internal Medicine

## 2018-07-30 NOTE — Telephone Encounter (Signed)
Cannot refill narcotics without OV

## 2018-07-30 NOTE — Telephone Encounter (Signed)
Julia Murray [phone]  [place] called to say she had gotten better, but she is starting to get sick again with stuffy head, congestion, cough at night, scratchy throat, no headache, no travel, husband has had same thing, they sent to be passing it back and forth. She would like for you to send her in another round of antibiotic and the no narcotic cough pills.

## 2018-07-31 ENCOUNTER — Other Ambulatory Visit: Payer: Self-pay

## 2018-07-31 ENCOUNTER — Encounter: Payer: Self-pay | Admitting: Internal Medicine

## 2018-07-31 ENCOUNTER — Other Ambulatory Visit: Payer: Self-pay | Admitting: Internal Medicine

## 2018-07-31 ENCOUNTER — Ambulatory Visit: Payer: BLUE CROSS/BLUE SHIELD | Admitting: Internal Medicine

## 2018-07-31 VITALS — BP 100/60 | HR 68 | Temp 98.5°F | Ht 64.25 in | Wt 197.0 lb

## 2018-07-31 DIAGNOSIS — J22 Unspecified acute lower respiratory infection: Secondary | ICD-10-CM

## 2018-07-31 DIAGNOSIS — R05 Cough: Secondary | ICD-10-CM | POA: Diagnosis not present

## 2018-07-31 DIAGNOSIS — R059 Cough, unspecified: Secondary | ICD-10-CM

## 2018-07-31 DIAGNOSIS — M7918 Myalgia, other site: Secondary | ICD-10-CM

## 2018-07-31 MED ORDER — METHYLPREDNISOLONE ACETATE 80 MG/ML IJ SUSP
80.0000 mg | Freq: Once | INTRAMUSCULAR | Status: AC
  Administered 2018-07-31: 80 mg via INTRAMUSCULAR

## 2018-07-31 MED ORDER — BENZONATATE 100 MG PO CAPS: 100.0000 mg | ORAL_CAPSULE | Freq: Two times a day (BID) | ORAL | 1 refills | Status: DC | PRN

## 2018-07-31 NOTE — Telephone Encounter (Signed)
Do you want her to come in or webex?

## 2018-07-31 NOTE — Telephone Encounter (Signed)
She needs OV

## 2018-07-31 NOTE — Progress Notes (Signed)
   Subjective:    Patient ID: Julia Murray, female    DOB: [birth year], 63 y.o.   MRN: [id number]  HPI 63 year old Female who is complaining of persistent cough.  She was seen here March 5 and was treated with Tessalon Perles and Zithromax Z-PAK.  Says she improved but cough never completely went away.  She is retiring this week.  Has been staying at home and working some from home.  No significant travel history.  She would like for her medications to be refilled.  Explained that coronavirus testing is not readily available.  Review of Systems no myalgias, nausea vomiting or diarrhea     Objective:   Physical Exam Vital signs reviewed.  She is afebrile. Pharynx and TMs are clear.  Neck is supple.  Chest clear to auscultation without rales or wheezing.      Assessment & Plan:  Protracted lower respiratory infection with nonproductive cough  Plan: I suspect there is a component of allergy at this point in time.  Rather than refill Zithromax Z-PAK I would prefer to refill Tessalon Perles.  She was given Depo-Medrol 80 mg IM and call if not improving.  15 minutes spent with patient

## 2018-07-31 NOTE — Patient Instructions (Signed)
Tessalon Perles 100 mg 3 times daily as needed for cough.  Depo-Medrol 80 mg IM.  Rest and drink plenty of fluids.  Stay at home.

## 2018-08-03 NOTE — Patient Instructions (Signed)
Zithromax Z-PAK take 2 tablets day 1 followed by 1 tablet days 2 through 5.  Tessalon Perles 100 mg 3 times daily as needed for cough.  Rest and drink plenty of fluids.

## 2018-08-03 NOTE — Progress Notes (Signed)
   Subjective:    Patient ID: Julia Murray, female    DOB: 1955/06/24, 63 y.o.   MRN: [id number]  HPI 63 year old Female with URI symptoms for 3 weeks.  No fever or shaking chills.  No flulike symptoms.  Has had cough that simply will not go away.  No sore throat.    Review of Systems 's see above    Objective:   Physical Exam Vital signs reviewed.  Skin warm and dry.  Nodes none.  Neck is supple.  Chest clear to auscultation without rales or wheezing.       Assessment & Plan:  Protracted lower respiratory infection  Plan: Zithromax Z-PAK take 2 tablets day 1 followed by 1 tablet days 2 through 5.  Tessalon Perles 100 mg 3 times a day as needed for cough.  Rest and drink plenty of fluids.

## 2018-08-23 ENCOUNTER — Other Ambulatory Visit: Payer: Self-pay | Admitting: Internal Medicine

## 2018-08-23 DIAGNOSIS — M7918 Myalgia, other site: Secondary | ICD-10-CM

## 2018-10-20 ENCOUNTER — Other Ambulatory Visit: Payer: Self-pay | Admitting: Internal Medicine

## 2018-10-20 NOTE — Telephone Encounter (Signed)
Pt past due for CPE appt cannot refill until booked

## 2018-10-23 NOTE — Telephone Encounter (Signed)
Schedule CPE for 11/11/18

## 2018-11-10 ENCOUNTER — Other Ambulatory Visit: Payer: 59 | Admitting: Internal Medicine

## 2018-11-10 ENCOUNTER — Other Ambulatory Visit: Payer: Self-pay

## 2018-11-10 DIAGNOSIS — E785 Hyperlipidemia, unspecified: Secondary | ICD-10-CM

## 2018-11-10 DIAGNOSIS — M1712 Unilateral primary osteoarthritis, left knee: Secondary | ICD-10-CM

## 2018-11-10 DIAGNOSIS — K219 Gastro-esophageal reflux disease without esophagitis: Secondary | ICD-10-CM

## 2018-11-10 DIAGNOSIS — E559 Vitamin D deficiency, unspecified: Secondary | ICD-10-CM

## 2018-11-10 DIAGNOSIS — Z0000 Encounter for general adult medical examination without abnormal findings: Secondary | ICD-10-CM

## 2018-11-11 ENCOUNTER — Ambulatory Visit (INDEPENDENT_AMBULATORY_CARE_PROVIDER_SITE_OTHER): Payer: 59 | Admitting: Internal Medicine

## 2018-11-11 ENCOUNTER — Encounter: Payer: Self-pay | Admitting: Internal Medicine

## 2018-11-11 VITALS — BP 102/80 | HR 68 | Temp 98.3°F | Ht 64.25 in | Wt 185.0 lb

## 2018-11-11 DIAGNOSIS — Z0000 Encounter for general adult medical examination without abnormal findings: Secondary | ICD-10-CM

## 2018-11-11 DIAGNOSIS — Z8639 Personal history of other endocrine, nutritional and metabolic disease: Secondary | ICD-10-CM

## 2018-11-11 DIAGNOSIS — E782 Mixed hyperlipidemia: Secondary | ICD-10-CM

## 2018-11-11 DIAGNOSIS — M7918 Myalgia, other site: Secondary | ICD-10-CM

## 2018-11-11 DIAGNOSIS — J301 Allergic rhinitis due to pollen: Secondary | ICD-10-CM | POA: Diagnosis not present

## 2018-11-11 DIAGNOSIS — K219 Gastro-esophageal reflux disease without esophagitis: Secondary | ICD-10-CM | POA: Diagnosis not present

## 2018-11-11 DIAGNOSIS — Z6831 Body mass index (BMI) 31.0-31.9, adult: Secondary | ICD-10-CM

## 2018-11-11 LAB — CBC WITH DIFFERENTIAL/PLATELET
Absolute Monocytes: 461 cells/uL (ref 200–950)
Basophils Absolute: 39 cells/uL (ref 0–200)
Basophils Relative: 0.8 %
Eosinophils Absolute: 118 cells/uL (ref 15–500)
Eosinophils Relative: 2.4 %
HCT: 37.6 % (ref 35.0–45.0)
Hemoglobin: 12.6 g/dL (ref 11.7–15.5)
Lymphs Abs: 1779 cells/uL (ref 850–3900)
MCH: 29.1 pg (ref 27.0–33.0)
MCHC: 33.5 g/dL (ref 32.0–36.0)
MCV: 86.8 fL (ref 80.0–100.0)
MPV: 10.5 fL (ref 7.5–12.5)
Monocytes Relative: 9.4 %
Neutro Abs: 2504 cells/uL (ref 1500–7800)
Neutrophils Relative %: 51.1 %
Platelets: 258 10*3/uL (ref 140–400)
RBC: 4.33 10*6/uL (ref 3.80–5.10)
RDW: 15.1 % — ABNORMAL HIGH (ref 11.0–15.0)
Total Lymphocyte: 36.3 %
WBC: 4.9 10*3/uL (ref 3.8–10.8)

## 2018-11-11 LAB — COMPLETE METABOLIC PANEL WITH GFR
AG Ratio: 1.8 (calc) (ref 1.0–2.5)
ALT: 17 U/L (ref 6–29)
AST: 16 U/L (ref 10–35)
Albumin: 4.2 g/dL (ref 3.6–5.1)
Alkaline phosphatase (APISO): 82 U/L (ref 37–153)
BUN/Creatinine Ratio: 44 (calc) — ABNORMAL HIGH (ref 6–22)
BUN: 28 mg/dL — ABNORMAL HIGH (ref 7–25)
CO2: 24 mmol/L (ref 20–32)
Calcium: 9.3 mg/dL (ref 8.6–10.4)
Chloride: 106 mmol/L (ref 98–110)
Creat: 0.63 mg/dL (ref 0.50–0.99)
GFR, Est African American: 111 mL/min/{1.73_m2} (ref >=60)
GFR, Est Non African American: 95 mL/min/{1.73_m2} (ref >=60)
Globulin: 2.3 g/dL (calc) (ref 1.9–3.7)
Glucose, Bld: 81 mg/dL (ref 65–99)
Potassium: 4.6 mmol/L (ref 3.5–5.3)
Sodium: 141 mmol/L (ref 135–146)
Total Bilirubin: 0.7 mg/dL (ref 0.2–1.2)
Total Protein: 6.5 g/dL (ref 6.1–8.1)

## 2018-11-11 LAB — POCT URINALYSIS DIPSTICK
Appearance: NEGATIVE
Bilirubin, UA: NEGATIVE
Blood, UA: NEGATIVE
Glucose, UA: NEGATIVE
Ketones, UA: NEGATIVE
Leukocytes, UA: NEGATIVE
Nitrite, UA: NEGATIVE
Odor: NEGATIVE
Protein, UA: NEGATIVE
Spec Grav, UA: 1.02 (ref 1.010–1.025)
Urobilinogen, UA: 0.2 E.U./dL
pH, UA: 6.5 (ref 5.0–8.0)

## 2018-11-11 LAB — LIPID PANEL
Cholesterol: 206 mg/dL — ABNORMAL HIGH (ref <200)
HDL: 92 mg/dL (ref >=50)
LDL Cholesterol (Calc): 94 mg/dL (calc)
Non-HDL Cholesterol (Calc): 114 mg/dL (calc) (ref <130)
Total CHOL/HDL Ratio: 2.2 (calc) (ref <5.0)
Triglycerides: 102 mg/dL (ref <150)

## 2018-11-11 LAB — VITAMIN D 25 HYDROXY (VIT D DEFICIENCY, FRACTURES): Vit D, 25-Hydroxy: 47 ng/mL (ref 30–100)

## 2018-11-11 LAB — TSH: TSH: 1.59 mIU/L (ref 0.40–4.50)

## 2018-11-29 ENCOUNTER — Encounter: Payer: Self-pay | Admitting: Internal Medicine

## 2018-11-29 DIAGNOSIS — J309 Allergic rhinitis, unspecified: Secondary | ICD-10-CM | POA: Insufficient documentation

## 2018-11-29 DIAGNOSIS — E782 Mixed hyperlipidemia: Secondary | ICD-10-CM | POA: Insufficient documentation

## 2018-11-29 NOTE — Patient Instructions (Signed)
It was a pleasure to see you today.  Continue current medications and follow-up in 1 year.  Work on diet exercise and weight loss.

## 2018-11-29 NOTE — Progress Notes (Signed)
Subjective:    Patient ID: Julia Murray, female    DOB: [birth year], 63 y.o.   MRN: [id number]  HPI  63 year old Female for health maintenance exam and evaluation of medical issues.  History of GE reflux, hyperlipidemia, vitamin D deficiency and osteoarthritis of her knees.  Has had steroid injections in her knees by Dr. Marlou Sa in the past.  History of mixed hyperlipidemia treated with Lipitor.  Takes Mobic for musculoskeletal pain and Prevacid for GE reflux.  For allergy symptoms uses Flonase nasal spray.  Is also on vitamin D supplementation.  December 2009 she had a bout of ischemic colitis and was hospitalized in [facility].  This was left-sided ischemic colitis which was treated with Flagyl and Levaquin.  Required a blood transfusion.  Biopsies were compatible with ischemic colitis.  Symptoms started with a sudden bout of abdominal pain after returning from eating a steak dinner.  She has never had a recurrence.  She was seen by Dr. Fuller Plan in follow-up.  Had colonoscopy in 2011.  No known drug allergies.  Fractured hand in the 1980s.  Torn anterior cruciate ligament 2007.  GYN is Dr. Willis Modena.  History of right knee arthroscopic surgery by Dr. Tonita Cong December 2013.  Had significant anterior cruciate ligament disease, bilateral mid menisci tears and loose bodies.  Subsequently developed infection postoperatively and had to be readmitted January 2014 for IV antibiotics, right knee drainage and wound dehiscence.  History of dependent edema for which she has taken Maxide.  GE reflux treated with PPI.  Has been on Lipitor since February 2012.  Says Crestor caused constipation.  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  Married.  Husband is vice Software engineer of [employer].  Formerly worked as a Statistician.  Completed 2 years of college.  Does not smoke.  Social alcohol consumption.  She has an adult daughter and a stepson.  Has 1  grandchild.  Family history: Father died at age 15 of heart failure.  Mother with history of diabetes.  One brother in good health.  Father had gout, kidney stones and ulcers in addition to heart disease.    Review of Systems no new complaints     Objective:   Physical Exam BMI 31.51.  Weight 185 pounds.  Temperature 98.3 degrees orally pulse 68 and regular blood pressure 102/80.  Skin warm and dry.  Nodes none.  TMs and pharynx are clear.  Neck is supple without JVD thyromegaly or carotid bruits.  Chest clear to auscultation without rales or wheezing.  Breasts normal female without masses.  Cardiac exam regular rate and rhythm normal S1 and S2.  Extremities without edema or deformity.  Neuro no focal deficits on brief neurological exam.  GYN exam deferred to Dr. Willis Modena.       Assessment & Plan:  Mixed hyperlipidemia-cholesterol is good at 52 and previously last year was 230.  HDL was 92.  Triglycerides normal at 102 and LDL cholesterol is normal at 94.    C met is within normal limits as is CBC.  TSH is normal.  Vitamin D level is normal.  A year ago vitamin D level was 29.  History of vitamin D deficiency-improved from last year on vitamin D supplement daily  History of bilateral knee pain treated with meloxicam  GE reflux treated with PPI  Allergic rhinitis-treated with Flonase  BMI 31-needs to work on diet exercise and weight loss  Plan: Continue  current medications and return in 1 year or as needed.  Work on diet exercise and weight loss.

## 2018-11-30 ENCOUNTER — Encounter: Payer: Self-pay | Admitting: Internal Medicine

## 2018-11-30 ENCOUNTER — Telehealth: Payer: Self-pay | Admitting: Internal Medicine

## 2018-11-30 NOTE — Telephone Encounter (Signed)
Phone call to patient.  Husband is hospitalized [facility] facility with COVID-19 and bilateral pneumonia.  He is on oxygen.  He is receiving antiviral treatment.  Patient thinks he may have been exposed at his work.  Patient currently is asymptomatic but I think she should be tested to see if she is an asymptomatic carrier.  She is agreeable to being tested.  We will arrange for testing at [facility] test site.

## 2018-12-01 ENCOUNTER — Telehealth: Payer: Self-pay

## 2018-12-01 DIAGNOSIS — Z20822 Contact with and (suspected) exposure to covid-19: Secondary | ICD-10-CM

## 2018-12-01 DIAGNOSIS — Z20828 Contact with and (suspected) exposure to other viral communicable diseases: Secondary | ICD-10-CM

## 2018-12-01 NOTE — Telephone Encounter (Signed)
Left voicemail patient can go to [facility] to get tested. Order has been put in. Patient to call back with questions or concerns.

## 2018-12-04 ENCOUNTER — Other Ambulatory Visit: Payer: Self-pay

## 2018-12-04 DIAGNOSIS — Z20822 Contact with and (suspected) exposure to covid-19: Secondary | ICD-10-CM

## 2018-12-06 LAB — NOVEL CORONAVIRUS, NAA: SARS-CoV-2, NAA: DETECTED — AB

## 2018-12-07 ENCOUNTER — Telehealth: Payer: Self-pay | Admitting: Internal Medicine

## 2018-12-07 ENCOUNTER — Encounter: Payer: Self-pay | Admitting: Internal Medicine

## 2018-12-07 NOTE — Telephone Encounter (Signed)
Received call from [facility] that Covid 19 test is positive. Left voice mail for pt and will [facility]. Will follow up with pt next week as husband discharged yesterday from [facility].

## 2019-01-14 ENCOUNTER — Other Ambulatory Visit: Payer: Self-pay | Admitting: Internal Medicine

## 2019-05-08 HISTORY — PX: AUGMENTATION MAMMAPLASTY: SUR837

## 2019-11-11 ENCOUNTER — Other Ambulatory Visit: Payer: Self-pay | Admitting: Internal Medicine

## 2019-11-11 DIAGNOSIS — M7918 Myalgia, other site: Secondary | ICD-10-CM

## 2019-12-11 ENCOUNTER — Other Ambulatory Visit: Payer: Self-pay | Admitting: Internal Medicine

## 2019-12-15 ENCOUNTER — Other Ambulatory Visit: Payer: 59

## 2019-12-15 ENCOUNTER — Other Ambulatory Visit: Payer: Self-pay

## 2019-12-15 DIAGNOSIS — Z20822 Contact with and (suspected) exposure to covid-19: Secondary | ICD-10-CM

## 2019-12-17 LAB — NOVEL CORONAVIRUS, NAA: SARS-CoV-2, NAA: NOT DETECTED

## 2019-12-17 LAB — SARS-COV-2, NAA 2 DAY TAT

## 2019-12-30 ENCOUNTER — Other Ambulatory Visit: Payer: Self-pay | Admitting: Internal Medicine

## 2019-12-30 NOTE — Telephone Encounter (Signed)
Past due for CPE. Please call her for appt before refilling

## 2020-02-19 ENCOUNTER — Other Ambulatory Visit: Payer: 59 | Admitting: Internal Medicine

## 2020-02-22 ENCOUNTER — Other Ambulatory Visit: Payer: Self-pay

## 2020-02-22 ENCOUNTER — Ambulatory Visit (INDEPENDENT_AMBULATORY_CARE_PROVIDER_SITE_OTHER): Payer: Medicare Other | Admitting: Internal Medicine

## 2020-02-22 ENCOUNTER — Encounter: Payer: Self-pay | Admitting: Internal Medicine

## 2020-02-22 VITALS — BP 120/70 | HR 72 | Ht 64.5 in | Wt 168.0 lb

## 2020-02-22 DIAGNOSIS — Z0000 Encounter for general adult medical examination without abnormal findings: Secondary | ICD-10-CM | POA: Diagnosis not present

## 2020-02-22 DIAGNOSIS — M1712 Unilateral primary osteoarthritis, left knee: Secondary | ICD-10-CM

## 2020-02-22 DIAGNOSIS — M7918 Myalgia, other site: Secondary | ICD-10-CM | POA: Diagnosis not present

## 2020-02-22 DIAGNOSIS — E785 Hyperlipidemia, unspecified: Secondary | ICD-10-CM

## 2020-02-22 DIAGNOSIS — Z23 Encounter for immunization: Secondary | ICD-10-CM

## 2020-02-22 DIAGNOSIS — M1711 Unilateral primary osteoarthritis, right knee: Secondary | ICD-10-CM

## 2020-02-22 DIAGNOSIS — Z9882 Breast implant status: Secondary | ICD-10-CM

## 2020-02-22 DIAGNOSIS — J301 Allergic rhinitis due to pollen: Secondary | ICD-10-CM

## 2020-02-22 DIAGNOSIS — Z6828 Body mass index (BMI) 28.0-28.9, adult: Secondary | ICD-10-CM

## 2020-02-22 DIAGNOSIS — K219 Gastro-esophageal reflux disease without esophagitis: Secondary | ICD-10-CM | POA: Diagnosis not present

## 2020-02-22 DIAGNOSIS — Z8616 Personal history of COVID-19: Secondary | ICD-10-CM

## 2020-02-22 LAB — POCT URINALYSIS DIPSTICK
Appearance: NEGATIVE
Bilirubin, UA: NEGATIVE
Blood, UA: NEGATIVE
Glucose, UA: NEGATIVE
Ketones, UA: NEGATIVE
Leukocytes, UA: NEGATIVE
Nitrite, UA: NEGATIVE
Odor: NEGATIVE
Protein, UA: NEGATIVE
Spec Grav, UA: 1.01 (ref 1.010–1.025)
Urobilinogen, UA: 0.2 E.U./dL
pH, UA: 6.5 (ref 5.0–8.0)

## 2020-02-22 NOTE — Patient Instructions (Signed)
It was a pleasure to see you today.  Pneumococcal 23 vaccine given.  EKG is within normal limits.  Labs drawn and pending.  Return in 1 year or as needed.

## 2020-02-22 NOTE — Progress Notes (Addendum)
Subjective:    Patient ID: Julia Murray, female    DOB: [birth year], 64 y.o.   MRN: [id number]  HPI 64 year old Female seen today for welcome to Medicare physical examination.  Welcome to Medicare EKG obtained and is within normal limits.  Pneumococcal 23 vaccine given today.  Fasting labs from today are pending.  Dr. Willis Modena is GYN physician.  He indicated to her she had a polyp of her cervix in her uterus both of which were removed by him.  She was having some vaginal bleeding as a result of these polyps.  In April 2021, Dr. Towanda Malkin replaced her breast implants.  He has had them for around 20 years.  In early August 2020, patient tested positive for COVID-19 but never became ill.  Husband however was hospitalized for COVID-19 in July 2020.  Has osteoarthritis issues with both knees and has had steroid injections by Dr. Tonita Cong in the past.  Says these continue to bother her and she may have to think about arthroplasty.  She has a history of GE reflux, hyperlipidemia, vitamin D deficiency.  History of allergic rhinitis treated with Flonase nasal spray.  In December 2009 she had a bout of ischemic colitis and was hospitalized in [facility].  There was left-sided ischemic colitis which was treated with Flagyl and Levaquin.  She required a blood transfusion.  Biopsies were compatible with ischemic colitis.  Symptoms started suddenly with abdominal pain after returning from eating a steak dinner.  She has never had a recurrence.  She was seen by Dr. Fuller Plan in follow-up and had colonoscopy in 2011.  She is due for repeat colonoscopy.  No known drug allergies.  Fractured hand in the 1980s.  Torn anterior cruciate ligament 2007.  She had right knee arthroscopic surgery by Dr. Tonita Cong in December 2013.  Had significant anterior cruciate ligament disease, bilateral mid menisci tears and loose bodies.  Subsequently developed infection postoperatively and had to be readmitted  January 2014 for IV antibiotics, right knee drainage and wound dehiscence.  History of dependent edema for which she takes Maxide.  GE reflux treated with PPI.  Has been on Lipitor since February 2012.  Says Crestor causes constipation.  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  She is married.  Husband is vice president of [employer].  She formerly worked as a Statistician.  Completed 2 years of college.  Does not smoke.  Social alcohol consumption.  She has an adult daughter and a stepson.  Has 1 grandchild.  Family history: Father died at age 7 of heart failure.  Mother with history of diabetes.  1 brother in good health.  Father had gout, kidney stones and ulcers in addition to heart disease.      Review of Systems  Constitutional: Negative.   Respiratory: Negative.   Cardiovascular: Negative.   Gastrointestinal: Negative.   Neurological: Negative.   Psychiatric/Behavioral: Negative.        Objective:   Physical Exam Vitals reviewed.  Constitutional:      Appearance: Normal appearance.  HENT:     Head: Normocephalic.     Right Ear: Tympanic membrane normal.     Left Ear: Tympanic membrane normal.     Nose: Nose normal.  Eyes:     General: No scleral icterus.       Right eye: No discharge.        Left eye: No discharge.     Extraocular  Movements: Extraocular movements intact.     Conjunctiva/sclera: Conjunctivae normal.     Pupils: Pupils are equal, round, and reactive to light.  Neck:     Vascular: No carotid bruit.     Comments: No thyromegaly or carotid bruits Cardiovascular:     Rate and Rhythm: Normal rate and regular rhythm.     Pulses: Normal pulses.     Heart sounds: No murmur heard.      Comments: Bilateral breast implants Pulmonary:     Effort: Pulmonary effort is normal.     Breath sounds: Normal breath sounds. No wheezing or rales.  Abdominal:     General: Bowel sounds are normal.     Palpations: Abdomen is  soft. There is no mass.     Tenderness: There is no abdominal tenderness. There is no guarding or rebound.  Genitourinary:    Comments: Deferred to Dr. Willis Modena Musculoskeletal:     Cervical back: Neck supple.     Comments: Trace lower extremity edema bilaterally  Lymphadenopathy:     Cervical: No cervical adenopathy.  Skin:    General: Skin is warm and dry.  Neurological:     General: No focal deficit present.     Mental Status: She is alert and oriented to person, place, and time.     Cranial Nerves: No cranial nerve deficit.     Motor: No weakness.     Gait: Gait normal.  Psychiatric:        Mood and Affect: Mood normal.        Behavior: Behavior normal.        Thought Content: Thought content normal.        Judgment: Judgment normal.           Assessment & Plan:  History of mixed hyperlipidemia-labs drawn and pending  History of vitamin D deficiency  History of bilateral knee pain due to osteoarthritis treated with meloxicam-Dr. Tonita Cong asked her to be careful with this and she has more recently been taking ibuprofen.  GE reflux treated with PPI  Allergic rhinitis treated with Flonase  BMI 28.39-last year BMI was 31.  She is able to exercise more since she retired.  History of COVID-19 Summer 2020 with minimal symptoms  Plan: Continue current medications and return in 1 year or as needed.  Labs are pending and will be discussed with her via telephone.  Pneumococcal 23 vaccine given today.  Subjective:   Patient presents for Medicare Annual/Subsequent preventive examination.  Review Past Medical/Family/Social: See above   Risk Factors  Current exercise habits: Gets quite a bit of exercise on a regular basis-goes to gym and walks and cleans her own home Dietary issues discussed: Low-fat low carbohydrate  Cardiac risk factors: Family history in father  Depression Screen  (Note: if answer to either of the following is "Yes", a more complete depression  screening is indicated)   Over the past two weeks, have you felt down, depressed or hopeless? No  Over the past two weeks, have you felt little interest or pleasure in doing things? No Have you lost interest or pleasure in daily life? No Do you often feel hopeless? No Do you cry easily over simple problems? No   Activities of Daily Living  In your present state of health, do you have any difficulty performing the following activities?:   Driving? No  Managing money? No  Feeding yourself? No  Getting from bed to chair? No  Climbing a flight of stairs? No  Preparing food and eating?: No  Bathing or showering? No  Getting dressed: No  Getting to the toilet? No  Using the toilet:No  Moving around from place to place: No  In the past year have you fallen or had a near fall?:No  Are you sexually active?  Yes Do you have more than one partner? No   Hearing Difficulties:  Do you often ask people to speak up or repeat themselves?  Yes Do you experience ringing or noises in your ears? No  Do you have difficulty understanding soft or whispered voices?  Yes Do you feel that you have a problem with memory? No Do you often misplace items? No    Home Safety:  Do you have a smoke alarm at your residence? Yes Do you have grab bars in the bathroom?  No Do you have throw rugs in your house?  Yes   Cognitive Testing  Alert? Yes Normal Appearance?Yes  Oriented to person? Yes Place? Yes  Time? Yes  Recall of three objects? Yes  Can perform simple calculations? Yes  Displays appropriate judgment?Yes  Can read the correct time from a watch face?Yes   List the Names of Other Physician/Practitioners you currently use:  See referral list for the physicians patient is currently seeing.   Orthopedist for knee pain  Dr. Towanda Malkin is plastic surgeon  Review of Systems: See above   Objective:     General appearance: Appears younger than stated age Head: Normocephalic, without obvious  abnormality, atraumatic  Eyes: conj clear, EOMi PEERLA  Ears: normal TM's and external ear canals both ears  Nose: Nares normal. Septum midline. Mucosa normal. No drainage or sinus tenderness.  Throat: lips, mucosa, and tongue normal; teeth and gums normal  Neck: no adenopathy, no carotid bruit, no JVD, supple, symmetrical, trachea midline and thyroid not enlarged, symmetric, no tenderness/mass/nodules  No CVA tenderness.  Lungs: clear to auscultation bilaterally  Breasts: Bilateral breast implants Heart: regular rate and rhythm, S1, S2 normal, no murmur, click, rub or gallop  Abdomen: soft, non-tender; bowel sounds normal; no masses, no organomegaly  Musculoskeletal: ROM normal in all joints, no crepitus, no deformity, Normal muscle strengthen. Back  is symmetric, no curvature. Skin: Skin color, texture, turgor normal. No rashes or lesions  Lymph nodes: Cervical, supraclavicular, and axillary nodes normal.  Neurologic: CN 2 -12 Normal, Normal symmetric reflexes. Normal coordination and gait  Psych: Alert & Oriented x 3, Mood appear stable.    Assessment:    Annual wellness medicare exam   Plan:    During the course of the visit the patient was educated and counseled about appropriate screening and preventive services including:   Pneumococcal 23 vaccine  Declined flu vaccine today     Patient Instructions (the written plan) was given to the patient.  Medicare Attestation  I have personally reviewed:  The patient's medical and social history  Their use of alcohol, tobacco or illicit drugs  Their current medications and supplements  The patient's functional ability including ADLs,fall risks, home safety risks, cognitive, and hearing and visual impairment  Diet and physical activities  Evidence for depression or mood disorders  The patient's weight, height, BMI, and visual acuity have been recorded in the chart. I have made referrals, counseling, and provided education to the  patient based on review of the above and I have provided the patient with a written personalized care plan for preventive services.

## 2020-02-23 ENCOUNTER — Telehealth: Payer: Self-pay | Admitting: Internal Medicine

## 2020-02-23 ENCOUNTER — Other Ambulatory Visit: Payer: Self-pay

## 2020-02-23 DIAGNOSIS — R718 Other abnormality of red blood cells: Secondary | ICD-10-CM

## 2020-02-23 MED ORDER — VALACYCLOVIR HCL 500 MG PO TABS: 500.0000 mg | ORAL_TABLET | Freq: Every day | ORAL | 11 refills | Status: AC

## 2020-02-23 NOTE — Addendum Note (Signed)
Addended by: Elby Showers on: 02/23/2020 11:35 AM   Modules accepted: Orders

## 2020-02-23 NOTE — Telephone Encounter (Signed)
Please put in these Covid vaccine dates. I have handles the rest

## 2020-02-23 NOTE — Telephone Encounter (Signed)
Covid vaccines are in.

## 2020-02-23 NOTE — Telephone Encounter (Signed)
Julia Murray [phone]  [name] called to check on prescription for Valtrex and referral for Colonoscopy and to give dates for her vaccines.   COVID-19 Vaccines  [facility]erm[facility]  1st 3/27-21 2nd 08/29/19   [name], [name] Phone:  [phone]  Fax:  [phone]

## 2020-02-25 LAB — COMPLETE METABOLIC PANEL WITH GFR
AG Ratio: 1.8 (calc) (ref 1.0–2.5)
ALT: 17 U/L (ref 6–29)
AST: 19 U/L (ref 10–35)
Albumin: 4.4 g/dL (ref 3.6–5.1)
Alkaline phosphatase (APISO): 89 U/L (ref 37–153)
BUN: 19 mg/dL (ref 7–25)
CO2: 27 mmol/L (ref 20–32)
Calcium: 9.6 mg/dL (ref 8.6–10.4)
Chloride: 102 mmol/L (ref 98–110)
Creat: 0.59 mg/dL (ref 0.50–0.99)
GFR, Est African American: 111 mL/min/{1.73_m2} (ref >=60)
GFR, Est Non African American: 96 mL/min/{1.73_m2} (ref >=60)
Globulin: 2.5 g/dL (calc) (ref 1.9–3.7)
Glucose, Bld: 77 mg/dL (ref 65–99)
Potassium: 3.9 mmol/L (ref 3.5–5.3)
Sodium: 140 mmol/L (ref 135–146)
Total Bilirubin: 0.6 mg/dL (ref 0.2–1.2)
Total Protein: 6.9 g/dL (ref 6.1–8.1)

## 2020-02-25 LAB — CBC WITH DIFFERENTIAL/PLATELET
Absolute Monocytes: 468 cells/uL (ref 200–950)
Basophils Absolute: 30 cells/uL (ref 0–200)
Basophils Relative: 0.5 %
Eosinophils Absolute: 78 cells/uL (ref 15–500)
Eosinophils Relative: 1.3 %
HCT: 37.5 % (ref 35.0–45.0)
Hemoglobin: 11.9 g/dL (ref 11.7–15.5)
Lymphs Abs: 1974 cells/uL (ref 850–3900)
MCH: 24.7 pg — ABNORMAL LOW (ref 27.0–33.0)
MCHC: 31.7 g/dL — ABNORMAL LOW (ref 32.0–36.0)
MCV: 78 fL — ABNORMAL LOW (ref 80.0–100.0)
MPV: 9.6 fL (ref 7.5–12.5)
Monocytes Relative: 7.8 %
Neutro Abs: 3450 cells/uL (ref 1500–7800)
Neutrophils Relative %: 57.5 %
Platelets: 328 10*3/uL (ref 140–400)
RBC: 4.81 10*6/uL (ref 3.80–5.10)
RDW: 17.3 % — ABNORMAL HIGH (ref 11.0–15.0)
Total Lymphocyte: 32.9 %
WBC: 6 10*3/uL (ref 3.8–10.8)

## 2020-02-25 LAB — IRON,TIBC AND FERRITIN PANEL
%SAT: 12 % (calc) — ABNORMAL LOW (ref 16–45)
Ferritin: 9 ng/mL — ABNORMAL LOW (ref 16–288)
Iron: 52 ug/dL (ref 45–160)
TIBC: 441 mcg/dL (calc) (ref 250–450)

## 2020-02-25 LAB — LIPID PANEL
Cholesterol: 260 mg/dL — ABNORMAL HIGH (ref <200)
HDL: 142 mg/dL (ref >=50)
LDL Cholesterol (Calc): 104 mg/dL (calc) — ABNORMAL HIGH
Non-HDL Cholesterol (Calc): 118 mg/dL (calc) (ref <130)
Total CHOL/HDL Ratio: 1.8 (calc) (ref <5.0)
Triglycerides: 59 mg/dL (ref <150)

## 2020-02-25 LAB — TEST AUTHORIZATION

## 2020-02-25 LAB — TSH: TSH: 1.75 mIU/L (ref 0.40–4.50)

## 2020-03-27 ENCOUNTER — Other Ambulatory Visit: Payer: Self-pay | Admitting: Internal Medicine

## 2020-05-24 ENCOUNTER — Telehealth: Payer: Self-pay | Admitting: Internal Medicine

## 2020-05-24 ENCOUNTER — Other Ambulatory Visit: Payer: Medicare Other | Admitting: Internal Medicine

## 2020-05-24 NOTE — Telephone Encounter (Signed)
LVM to CB to reschedule labs so results will be back before appointment on 06/03/2020

## 2020-05-25 NOTE — Telephone Encounter (Signed)
Called pack and rescheduled

## 2020-05-26 ENCOUNTER — Encounter: Payer: Self-pay | Admitting: Gastroenterology

## 2020-05-27 ENCOUNTER — Ambulatory Visit: Payer: Medicare Other | Admitting: Internal Medicine

## 2020-05-31 ENCOUNTER — Other Ambulatory Visit: Payer: Self-pay

## 2020-05-31 ENCOUNTER — Other Ambulatory Visit: Payer: Medicare Other | Admitting: Internal Medicine

## 2020-05-31 DIAGNOSIS — R790 Abnormal level of blood mineral: Secondary | ICD-10-CM | POA: Diagnosis not present

## 2020-06-01 LAB — IRON,TIBC AND FERRITIN PANEL
%SAT: 8 % (calc) — ABNORMAL LOW (ref 16–45)
Ferritin: 14 ng/mL — ABNORMAL LOW (ref 16–288)
Iron: 35 ug/dL — ABNORMAL LOW (ref 45–160)
TIBC: 438 mcg/dL (calc) (ref 250–450)

## 2020-06-03 ENCOUNTER — Encounter: Payer: Self-pay | Admitting: Internal Medicine

## 2020-06-03 ENCOUNTER — Other Ambulatory Visit: Payer: Self-pay

## 2020-06-03 ENCOUNTER — Ambulatory Visit (INDEPENDENT_AMBULATORY_CARE_PROVIDER_SITE_OTHER): Payer: Medicare Other | Admitting: Internal Medicine

## 2020-06-03 VITALS — BP 140/90 | HR 88 | Ht 64.5 in | Wt 170.0 lb

## 2020-06-03 DIAGNOSIS — E611 Iron deficiency: Secondary | ICD-10-CM

## 2020-06-03 NOTE — Patient Instructions (Signed)
Please discontinue blood donation. RTC in 2 months to follow up. We are referring you for iron infusion. Have colonoscopy with Dr. Fuller Plan as planned.Not tolerating oral iron due to constipation.

## 2020-06-03 NOTE — Progress Notes (Signed)
   Subjective:    Patient ID: Julia Murray, female    DOB: [birth year], 65 y.o.   MRN: [id number]  HPI 65 year old Female seen for follow up on iron deficiency.  Has not had colonoscopy since 2009.  Has appointment for colonoscopy with Dr. Fuller Plan for February 23.  Patient has been giving blood on a fairly frequent basis.  May have double dosed donated 1 time with blood donation but usually just gives 1 pint at a time on a fairly regular basis.  She is really not able to tolerate iron supplement orally because it is causing extreme constipation.  Was found to have a low MCV of 78 in October.  Hemoglobin at that time was 11.9 g and had been as high as 13.5 g 3 years previously and 12.6 g a year previously.  MCV has always been normal.  Her iron level was low normal at 52 and her ferritin was 9.  Iron binding capacity was normal.  She was told to take iron supplementation.  She is now here for follow-up.  Total iron level is low at 35 and previously was 52.  Ferritin is 14 and previously was 9.  Has not really been taking iron supplement regularly.  I think she would be a candidate for iron infusion.  We will make that referral.   Had initial colonoscopy in 2009 at [facility] near [facility] when hospitalized with ischemic colitis.  Review of Systems see above-     Objective:   Physical Exam Blood pressure 140/90 pulse 88 pulse oximetry 98% weight 170 pounds  Patient not examined.  Discussion regarding blood donation.  I have asked her to discontinue blood donation at this time.  She does have appointment for colonoscopy in February with Dr. Fuller Plan.  Since she cannot tolerate oral iron, she may be a candidate for IV iron infusion.       Assessment & Plan:  Iron deficiency-likely source now seems to be blood donation which has been fairly frequent.  She will discontinue blood donation.  She will have colonoscopy with Dr. Fuller Plan in February.  We will make referral for iron  infusion and follow-up after that.

## 2020-06-15 ENCOUNTER — Other Ambulatory Visit: Payer: Self-pay

## 2020-06-15 ENCOUNTER — Ambulatory Visit (AMBULATORY_SURGERY_CENTER): Payer: Self-pay | Admitting: *Deleted

## 2020-06-15 VITALS — Ht 64.5 in | Wt 172.0 lb

## 2020-06-15 DIAGNOSIS — Z1211 Encounter for screening for malignant neoplasm of colon: Secondary | ICD-10-CM

## 2020-06-15 MED ORDER — PLENVU 140 G PO SOLR: 1.0000 | Freq: Once | ORAL | 0 refills | Status: AC

## 2020-06-15 NOTE — Progress Notes (Signed)
Patient is here in-person for PV. Patient denies any allergies to eggs or soy. Patient denies any problems with anesthesia/sedation. Patient denies any oxygen use at home. Patient denies taking any diet/weight loss medications or blood thinners. Patient is not being treated for MRSA or C-diff. Patient is aware of our care-partner policy and HRCBU-38 safety protocol. EMMI education assigned to the patient for the procedure, sent to [facility].   COVID-19 vaccines completed on 08/29/19 x2, per patient.   Prep Prescription coupon given to the patient.

## 2020-06-23 ENCOUNTER — Other Ambulatory Visit: Payer: Self-pay | Admitting: Internal Medicine

## 2020-06-27 ENCOUNTER — Other Ambulatory Visit: Payer: Self-pay | Admitting: Family

## 2020-06-27 ENCOUNTER — Encounter: Payer: Self-pay | Admitting: Gastroenterology

## 2020-06-27 DIAGNOSIS — D649 Anemia, unspecified: Secondary | ICD-10-CM

## 2020-06-28 ENCOUNTER — Inpatient Hospital Stay: Payer: Medicare Other | Attending: Hematology & Oncology

## 2020-06-28 ENCOUNTER — Inpatient Hospital Stay: Payer: Medicare Other | Admitting: Family

## 2020-06-29 ENCOUNTER — Encounter: Payer: Self-pay | Admitting: Gastroenterology

## 2020-06-29 ENCOUNTER — Other Ambulatory Visit: Payer: Self-pay

## 2020-06-29 ENCOUNTER — Other Ambulatory Visit: Payer: Self-pay | Admitting: Gastroenterology

## 2020-06-29 ENCOUNTER — Ambulatory Visit (AMBULATORY_SURGERY_CENTER): Payer: Medicare Other | Admitting: Gastroenterology

## 2020-06-29 VITALS — BP 115/54 | HR 64 | Temp 96.8°F | Resp 11 | Ht 64.0 in | Wt 172.0 lb

## 2020-06-29 DIAGNOSIS — D120 Benign neoplasm of cecum: Secondary | ICD-10-CM

## 2020-06-29 DIAGNOSIS — Z1211 Encounter for screening for malignant neoplasm of colon: Secondary | ICD-10-CM | POA: Diagnosis not present

## 2020-06-29 DIAGNOSIS — K529 Noninfective gastroenteritis and colitis, unspecified: Secondary | ICD-10-CM | POA: Diagnosis not present

## 2020-06-29 MED ORDER — SODIUM CHLORIDE 0.9 % IV SOLN: 500.0000 mL | Freq: Once | INTRAVENOUS | Status: DC

## 2020-06-29 NOTE — Op Note (Addendum)
[facility] Patient Name: Julia Murray Procedure Date: 06/29/2020 9:57 AM MRN: [id number] Endoscopist: Ladene Artist , MD Age: 65 Referring MD:  Date of Birth: Mar 20, 1956 Gender: Female Account #: [id number] Procedure:                Colonoscopy Indications:              Screening for colorectal malignant neoplasm Medicines:                Monitored Anesthesia Care Procedure:                Pre-Anesthesia Assessment:                           - Prior to the procedure, a History and Physical                            was performed, and patient medications and                            allergies were reviewed. The patient's tolerance of                            previous anesthesia was also reviewed. The risks                            and benefits of the procedure and the sedation                            options and risks were discussed with the patient.                            All questions were answered, and informed consent                            was obtained. Prior Anticoagulants: The patient has                            taken no previous anticoagulant or antiplatelet                            agents. ASA Grade Assessment: II - A patient with                            mild systemic disease. After reviewing the risks                            and benefits, the patient was deemed in                            satisfactory condition to undergo the procedure.                           After obtaining informed consent, the colonoscope  was passed under direct vision. Throughout the                            procedure, the patient's blood pressure, pulse, and                            oxygen saturations were monitored continuously. The                            Olympus [id number]) [id number] was introduced                            through the anus and advanced to the the cecum,                            identified by  appendiceal orifice and ileocecal                            valve. The ileocecal valve, appendiceal orifice,                            and rectum were photographed. The quality of the                            bowel preparation was good. The colonoscopy was                            performed without difficulty. The patient tolerated                            the procedure well. Scope In: 10:02:14 AM Scope Out: 10:15:10 AM Scope Withdrawal Time: 0 hours 9 minutes 50 seconds  Total Procedure Duration: 0 hours 12 minutes 56 seconds  Findings:                 The perianal and digital rectal examinations were                            normal.                           A 3 mm polyp was found in the cecum. The polyp was                            sessile. The polyp was removed with a cold biopsy                            forceps. Resection and retrieval were complete.                           A localized area of mildly erythematous mucosa was                            found in the cecum. Biopsies were taken with a cold  forceps for histology.                           Internal hemorrhoids were found during                            retroflexion. The hemorrhoids were moderate and                            Grade I (internal hemorrhoids that do not prolapse).                           The exam was otherwise without abnormality on                            direct and retroflexion views. Complications:            No immediate complications. Estimated blood loss:                            None. Estimated Blood Loss:     Estimated blood loss: none. Impression:               - One 3 mm polyp in the cecum, removed with a cold                            biopsy forceps. Resected and retrieved.                           - Localized erythematous mucosa in the cecum.                            Biopsied.                           - Internal hemorrhoids.                            - The examination was otherwise normal on direct                            and retroflexion views. Recommendation:           - Repeat colonoscopy after studies are complete for                            surveillance based on pathology results.                           - Patient has a contact number available for                            emergencies. The signs and symptoms of potential                            delayed complications were discussed with the  patient. Return to normal activities tomorrow.                            Written discharge instructions were provided to the                            patient.                           - Resume previous diet.                           - Continue present medications.                           - Await pathology results. Ladene Artist, MD 06/29/2020 10:18:04 AM This report has been signed electronically.

## 2020-06-29 NOTE — Progress Notes (Signed)
To pacu, VSS. Report to [email or web address] °

## 2020-06-29 NOTE — Progress Notes (Signed)
VS by CW  I have reviewed the patient's medical history in detail and updated the computerized patient record.  

## 2020-06-29 NOTE — Patient Instructions (Signed)
Please read handouts provided. Continue present medications. Await pathology results.   YOU HAD AN ENDOSCOPIC PROCEDURE TODAY AT THE [facility]:   Refer to the procedure report that was given to you for any specific questions about what was found during the examination.  If the procedure report does not answer your questions, please call your gastroenterologist to clarify.  If you requested that your care partner not be given the details of your procedure findings, then the procedure report has been included in a sealed envelope for you to review at your convenience later.  YOU SHOULD EXPECT: Some feelings of bloating in the abdomen. Passage of more gas than usual.  Walking can help get rid of the air that was put into your GI tract during the procedure and reduce the bloating. If you had a lower endoscopy (such as a colonoscopy or flexible sigmoidoscopy) you may notice spotting of blood in your stool or on the toilet paper. If you underwent a bowel prep for your procedure, you may not have a normal bowel movement for a few days.  Please Note:  You might notice some irritation and congestion in your nose or some drainage.  This is from the oxygen used during your procedure.  There is no need for concern and it should clear up in a day or so.  SYMPTOMS TO REPORT IMMEDIATELY:  Following lower endoscopy (colonoscopy or flexible sigmoidoscopy):  Excessive amounts of blood in the stool  Significant tenderness or worsening of abdominal pains  Swelling of the abdomen that is new, acute  Fever of 100F or higher   For urgent or emergent issues, a gastroenterologist can be reached at any hour by calling [phone]. Do not use MyChart messaging for urgent concerns.    DIET:  We do recommend a small meal at first, but then you may proceed to your regular diet.  Drink plenty of fluids but you should avoid alcoholic beverages for 24 hours.  ACTIVITY:  You should plan to take it easy  for the rest of today and you should NOT DRIVE or use heavy machinery until tomorrow (because of the sedation medicines used during the test).    FOLLOW UP: Our staff will call the number listed on your records 48-72 hours following your procedure to check on you and address any questions or concerns that you may have regarding the information given to you following your procedure. If we do not reach you, we will leave a message.  We will attempt to reach you two times.  During this call, we will ask if you have developed any symptoms of COVID 19. If you develop any symptoms (ie: fever, flu-like symptoms, shortness of breath, cough etc.) before then, please call [phone].  If you test positive for Covid 19 in the 2 weeks post procedure, please call and report this information to us.    If any biopsies were taken you will be contacted by phone or by letter within the next 1-3 weeks.  Please call us at [phone] if you have not heard about the biopsies in 3 weeks.    SIGNATURES/CONFIDENTIALITY: You and/or your care partner have signed paperwork which will be entered into your electronic medical record.  These signatures attest to the fact that that the information above on your After Visit Summary has been reviewed and is understood.  Full responsibility of the confidentiality of this discharge information lies with you and/or your care-partner.  

## 2020-06-29 NOTE — Progress Notes (Signed)
Called to room to assist during endoscopic procedure.  Patient ID and intended procedure confirmed with present staff. Received instructions for my participation in the procedure from the performing physician.  

## 2020-07-01 ENCOUNTER — Telehealth: Payer: Self-pay

## 2020-07-01 NOTE — Telephone Encounter (Signed)
  Follow up Call-  Call back number 06/29/2020  Post procedure Call Back phone  # [phone]  Permission to leave phone message Yes  Some recent data might be hidden     Patient questions:  Do you have a fever, pain , or abdominal swelling? No. Pain Score  0 *  Have you tolerated food without any problems? Yes.    Have you been able to return to your normal activities? Yes.    Do you have any questions about your discharge instructions: Diet   No. Medications  No. Follow up visit  No.  Do you have questions or concerns about your Care? No.  Actions: * If pain score is 4 or above: No action needed, pain <4.   1. Have you developed a fever since your procedure? no  2.   Have you had an respiratory symptoms (SOB or cough) since your procedure? no  3.   Have you tested positive for COVID 19 since your procedure no  4.   Have you had any family members/close contacts diagnosed with the COVID 19 since your procedure?  no   If yes to any of these questions please route to Joylene John, RN and Joella Prince, RN

## 2020-07-04 ENCOUNTER — Telehealth: Payer: Self-pay | Admitting: *Deleted

## 2020-07-04 NOTE — Telephone Encounter (Signed)
Patient was a no-show for the 06/28/20 appointment. The referral has been canceled and documented.

## 2020-07-05 ENCOUNTER — Other Ambulatory Visit: Payer: Self-pay | Admitting: Internal Medicine

## 2020-07-05 NOTE — Telephone Encounter (Signed)
Does she need to take this daily? Seems like we have refilled it a lot. Please call her.

## 2020-07-06 NOTE — Telephone Encounter (Signed)
She takes one tablet by mouth once a week to prevent outbreaks.

## 2020-07-13 ENCOUNTER — Encounter: Payer: Self-pay | Admitting: Gastroenterology

## 2020-07-20 DIAGNOSIS — M170 Bilateral primary osteoarthritis of knee: Secondary | ICD-10-CM | POA: Diagnosis not present

## 2020-09-23 ENCOUNTER — Other Ambulatory Visit: Payer: Self-pay | Admitting: Internal Medicine

## 2020-11-14 ENCOUNTER — Encounter: Payer: Self-pay | Admitting: Internal Medicine

## 2020-11-14 DIAGNOSIS — Z1231 Encounter for screening mammogram for malignant neoplasm of breast: Secondary | ICD-10-CM | POA: Diagnosis not present

## 2020-11-14 DIAGNOSIS — Z6828 Body mass index (BMI) 28.0-28.9, adult: Secondary | ICD-10-CM | POA: Diagnosis not present

## 2020-11-14 DIAGNOSIS — Z01419 Encounter for gynecological examination (general) (routine) without abnormal findings: Secondary | ICD-10-CM | POA: Diagnosis not present

## 2020-11-14 DIAGNOSIS — D251 Intramural leiomyoma of uterus: Secondary | ICD-10-CM | POA: Diagnosis not present

## 2020-11-14 LAB — HM MAMMOGRAPHY

## 2020-12-01 ENCOUNTER — Other Ambulatory Visit: Payer: Self-pay | Admitting: Obstetrics and Gynecology

## 2020-12-01 DIAGNOSIS — R928 Other abnormal and inconclusive findings on diagnostic imaging of breast: Secondary | ICD-10-CM

## 2020-12-10 ENCOUNTER — Other Ambulatory Visit: Payer: Self-pay | Admitting: Internal Medicine

## 2020-12-13 DIAGNOSIS — M25562 Pain in left knee: Secondary | ICD-10-CM | POA: Diagnosis not present

## 2020-12-13 DIAGNOSIS — M170 Bilateral primary osteoarthritis of knee: Secondary | ICD-10-CM | POA: Diagnosis not present

## 2020-12-19 ENCOUNTER — Other Ambulatory Visit: Payer: Self-pay | Admitting: Obstetrics and Gynecology

## 2020-12-19 ENCOUNTER — Ambulatory Visit
Admission: RE | Admit: 2020-12-19 | Discharge: 2020-12-19 | Disposition: A | Discharge status: Home / Self Care | Payer: Medicare Other | Source: Ambulatory Visit | Attending: Obstetrics and Gynecology | Admitting: Obstetrics and Gynecology

## 2020-12-19 ENCOUNTER — Other Ambulatory Visit: Payer: Self-pay

## 2020-12-19 ENCOUNTER — Ambulatory Visit: Payer: Medicare Other

## 2020-12-19 DIAGNOSIS — R922 Inconclusive mammogram: Secondary | ICD-10-CM | POA: Diagnosis not present

## 2020-12-19 DIAGNOSIS — R928 Other abnormal and inconclusive findings on diagnostic imaging of breast: Secondary | ICD-10-CM

## 2020-12-22 ENCOUNTER — Telehealth: Payer: Self-pay | Admitting: Internal Medicine

## 2020-12-22 MED ORDER — ATORVASTATIN CALCIUM 20 MG PO TABS: ORAL_TABLET | ORAL | 0 refills | Status: DC

## 2020-12-22 NOTE — Telephone Encounter (Signed)
Received Fax RX request from  [id number] - [place], [address] Phone:  [phone]  Fax:  [phone]      Medication - atorvastatin (LIPITOR) 20 MG tablet  Last Refill - 09/23/20  Last OV - 06/03/20  Last CPE - 02/22/20  Next Appointment - 02/28/21

## 2021-01-03 ENCOUNTER — Ambulatory Visit (HOSPITAL_COMMUNITY)
Admission: RE | Admit: 2021-01-03 | Discharge: 2021-01-03 | Disposition: A | Discharge status: Home / Self Care | Payer: Medicare Other | Source: Ambulatory Visit | Attending: Cardiology | Admitting: Cardiology

## 2021-01-03 ENCOUNTER — Other Ambulatory Visit (HOSPITAL_COMMUNITY): Payer: Self-pay | Admitting: Specialist

## 2021-01-03 ENCOUNTER — Other Ambulatory Visit: Payer: Self-pay

## 2021-01-03 DIAGNOSIS — M25562 Pain in left knee: Secondary | ICD-10-CM | POA: Diagnosis not present

## 2021-01-03 DIAGNOSIS — M79605 Pain in left leg: Secondary | ICD-10-CM

## 2021-01-03 DIAGNOSIS — M79604 Pain in right leg: Secondary | ICD-10-CM | POA: Diagnosis not present

## 2021-01-03 DIAGNOSIS — M79661 Pain in right lower leg: Secondary | ICD-10-CM | POA: Diagnosis not present

## 2021-01-13 ENCOUNTER — Encounter: Payer: Self-pay | Admitting: Vascular Surgery

## 2021-01-13 ENCOUNTER — Ambulatory Visit (INDEPENDENT_AMBULATORY_CARE_PROVIDER_SITE_OTHER): Payer: Medicare Other | Admitting: Vascular Surgery

## 2021-01-13 ENCOUNTER — Other Ambulatory Visit: Payer: Self-pay

## 2021-01-13 VITALS — BP 137/78 | HR 67 | Temp 98.1°F | Resp 20 | Ht 64.0 in | Wt 172.0 lb

## 2021-01-13 DIAGNOSIS — I82532 Chronic embolism and thrombosis of left popliteal vein: Secondary | ICD-10-CM

## 2021-01-13 NOTE — Progress Notes (Signed)
Office Note     CC:.  Op anticoagulation Requesting Provider:  Elby Showers, MD  HPI: Julia Murray is a 65 y.o. ([birth year]) female who presents at the request of Dr. Ardyth Gal, orthopedic surgery for recommendations regarding perioperative coagulation with known history of DVT.  Several years ago, the patient underwent knee arthroscopy and appreciated swelling afterwards with ultrasound afterward demonstrating DVT it was not treated as it was thought to be old thrombus due to its echogenicity.  The patient now requires left knee replacement.  Since seen last in clinic several years ago, Julia Murray's been doing well.  She is retired, remaining active with volunteer work throughout [employer].  She denies heaviness and pain in the left leg other than the joint space.  She continues to wear compression stockings religiously  The pt is on a statin for cholesterol management.  The pt is not on a daily aspirin.   Other AC:  - The pt is not on meds for hypertension.   The pt is not diabetic.  - Tobacco hx:  none  Past Medical History:  Diagnosis Date   Anxiety    GERD (gastroesophageal reflux disease)    Hyperlipidemia    Vitamin D deficiency disease     Past Surgical History:  Procedure Laterality Date   ANTERIOR CRUCIATE LIGAMENT REPAIR     left   AUGMENTATION MAMMAPLASTY Bilateral    BREAST BIOPSY Bilateral    multiple times per patient   BREAST SURGERY     mutiple bx   COLONOSCOPY  04/04/2008   at [facility] done   KNEE ARTHROSCOPY  05/20/2012   Procedure: ARTHROSCOPY KNEE;  Surgeon: Johnn Hai, MD;  Location: [facility];  Service: Orthopedics;  Laterality: Right;  right knee scope with incision and drainage and removal of loose foreign body    KNEE ARTHROSCOPY WITH MEDIAL MENISECTOMY  04/25/2012   Procedure: KNEE ARTHROSCOPY WITH MEDIAL MENISECTOMY;  Surgeon: Johnn Hai, MD;  Location: [facility];  Service: Orthopedics;   Laterality: Right;  Right Knee Arthrscopy with Partial Medial and Lateral Menisectomy with Removal of Loose Bodies   TONSILLECTOMY      Social History   Socioeconomic History   Marital status: Married    Spouse name: Not on file   Number of children: Not on file   Years of education: Not on file   Highest education level: Not on file  Occupational History   Not on file  Tobacco Use   Smoking status: Never   Smokeless tobacco: Never  Vaping Use   Vaping Use: Never used  Substance and Sexual Activity   Alcohol use: Yes    Alcohol/week: 4.0 standard drinks    Types: 4 Standard drinks or equivalent per week   Drug use: No   Sexual activity: Not on file  Other Topics Concern   Not on file  Social History Narrative   Not on file   Social Determinants of Health   Financial Resource Strain: Not on file  Food Insecurity: Not on file  Transportation Needs: Not on file  Physical Activity: Not on file  Stress: Not on file  Social Connections: Not on file  Intimate Partner Violence: Not on file    Family History  Problem Relation Age of Onset   Heart disease Father    Heart attack Father    Diabetes Mother    Colon cancer Neg Hx    Esophageal cancer Neg Hx  Rectal cancer Neg Hx    Stomach cancer Neg Hx    Colon polyps Neg Hx     Current Outpatient Medications  Medication Sig Dispense Refill   atorvastatin (LIPITOR) 20 MG tablet TAKE ONE TABLET BY MOUTH EVERY EVENING WITH SUPPER 90 tablet 0   cholecalciferol (VITAMIN D3) 25 MCG (1000 UT) tablet Take 1,000 Units by mouth 2 (two) times a day.     fluticasone (FLONASE) 50 MCG/ACT nasal spray SPRAY TWO SPRAYS IN EACH NOSTRIL ONCE DAILY 16 mL prn   lansoprazole (PREVACID) 15 MG capsule TAKE 1 CAPSULE DAILY AT 12 NOON 90 capsule 3   meloxicam (MOBIC) 15 MG tablet TAKE 1 TABLET (15 MG TOTAL) BY MOUTH DAILY. TAKE IT WITH A MEAL. 90 tablet 1   valACYclovir (VALTREX) 500 MG tablet One tab by mouth weekly which prevents outbreaks  of HSV 12 tablet 3   mupirocin ointment (BACTROBAN) 2 % SMARTSIG:1 Application Topical 2-3 Times Daily     No current facility-administered medications for this visit.    Allergies  Allergen Reactions   Shellfish Allergy Hives and Swelling    Pt States betadine ok   Bee Venom Other (See Comments)   Crab Extract Allergy Skin Test    Other      REVIEW OF SYSTEMS:   '[X]'$  denotes positive finding, '[ ]'$  denotes negative finding Cardiac  Comments:  Chest pain or chest pressure:    Shortness of breath upon exertion:    Short of breath when lying flat:    Irregular heart rhythm:        Vascular    Pain in calf, thigh, or hip brought on by ambulation: X   Pain in feet at night that wakes you up from your sleep:     Blood clot in your veins:    Leg swelling:         Pulmonary    Oxygen at home:    Productive cough:     Wheezing:         Neurologic    Sudden weakness in arms or legs:     Sudden numbness in arms or legs:     Sudden onset of difficulty speaking or slurred speech:    Temporary loss of vision in one eye:     Problems with dizziness:         Gastrointestinal    Blood in stool:     Vomited blood:         Genitourinary    Burning when urinating:     Blood in urine:        Psychiatric    Major depression:         Hematologic    Bleeding problems:    Problems with blood clotting too easily:        Skin    Rashes or ulcers:        Constitutional    Fever or chills:      PHYSICAL EXAMINATION:  Vitals:   01/13/21 1026  BP: 137/78  Pulse: 67  Resp: 20  Temp: 98.1 F (36.7 C)  SpO2: 94%  Weight: 172 lb (78 kg)  Height: '5\' 4"'$  (1.626 m)    General:  WDWN in NAD; vital signs documented above Gait: Not observed HENT: WNL, normocephalic Pulmonary: normal non-labored breathing , without Rales, rhonchi,  wheezing Cardiac: regular HR, without  Murmurs without carotid bruit- Abdomen: soft, NT, no masses Skin: without rashes Vascular Exam/Pulses:   Right Left  Radial 2+ (normal) 2+ (normal)  Ulnar 2+ (normal) 2+ (normal)  Femoral - -  Popliteal - -  DP 2+ (normal) 2+ (normal)  PT 2+ (normal) 2+ (normal)   Extremities: without ischemic changes, without Gangrene , without cellulitis; without open wounds;  Musculoskeletal: no muscle wasting or atrophy  Neurologic: A&O X 3;  No focal weakness or paresthesias are detected Psychiatric:  The pt has Normal affect.   Non-Invasive Vascular Imaging:   Noninvasive vascular imaging was independently reviewed demonstrating a reduction in the thrombotic burden that was once in the left venous system.  There is some chronic partially occlusive thrombus that now appears laminar on the wall of the left popliteal vein    ASSESSMENT/PLAN:: 65 y.o. female presenting with left knee pain for recommendations regarding perioperative anticoagulation.  The previous DVT years ago was provoked status post arthroscopy.  With only 1 provoked incident, guidelines do not recommend prophylactic full anticoagulation.  Vascular surgeon available if issues or questions arise.  Recommend standard perioperative prophylactic DVT PE dosing The patient can follow-up with our office as needed   Broadus John, MD [facility] [phone]

## 2021-02-20 DIAGNOSIS — M7918 Myalgia, other site: Secondary | ICD-10-CM

## 2021-02-20 MED ORDER — MELOXICAM 15 MG PO TABS: 15.0000 mg | ORAL_TABLET | Freq: Every day | ORAL | 1 refills | Status: DC

## 2021-02-20 NOTE — Telephone Encounter (Signed)
Refill sent in

## 2021-02-21 ENCOUNTER — Other Ambulatory Visit: Payer: Self-pay

## 2021-02-21 ENCOUNTER — Other Ambulatory Visit: Payer: Medicare Other | Admitting: Internal Medicine

## 2021-02-21 DIAGNOSIS — E782 Mixed hyperlipidemia: Secondary | ICD-10-CM

## 2021-02-21 DIAGNOSIS — Z0000 Encounter for general adult medical examination without abnormal findings: Secondary | ICD-10-CM | POA: Diagnosis not present

## 2021-02-21 DIAGNOSIS — R718 Other abnormality of red blood cells: Secondary | ICD-10-CM

## 2021-02-21 DIAGNOSIS — E785 Hyperlipidemia, unspecified: Secondary | ICD-10-CM

## 2021-02-21 DIAGNOSIS — Z1329 Encounter for screening for other suspected endocrine disorder: Secondary | ICD-10-CM | POA: Diagnosis not present

## 2021-02-21 NOTE — Progress Notes (Signed)
Lab only 

## 2021-02-22 LAB — LIPID PANEL
Cholesterol: 264 mg/dL — ABNORMAL HIGH (ref <200)
HDL: 135 mg/dL (ref >=50)
LDL Cholesterol (Calc): 108 mg/dL (calc) — ABNORMAL HIGH
Non-HDL Cholesterol (Calc): 129 mg/dL (calc) (ref <130)
Total CHOL/HDL Ratio: 2 (calc) (ref <5.0)
Triglycerides: 115 mg/dL (ref <150)

## 2021-02-22 LAB — COMPLETE METABOLIC PANEL WITH GFR
AG Ratio: 1.6 (calc) (ref 1.0–2.5)
ALT: 21 U/L (ref 6–29)
AST: 27 U/L (ref 10–35)
Albumin: 4.4 g/dL (ref 3.6–5.1)
Alkaline phosphatase (APISO): 79 U/L (ref 37–153)
BUN/Creatinine Ratio: 48 (calc) — ABNORMAL HIGH (ref 6–22)
BUN: 19 mg/dL (ref 7–25)
CO2: 25 mmol/L (ref 20–32)
Calcium: 9.8 mg/dL (ref 8.6–10.4)
Chloride: 107 mmol/L (ref 98–110)
Creat: 0.4 mg/dL — ABNORMAL LOW (ref 0.50–1.05)
Globulin: 2.8 g/dL (calc) (ref 1.9–3.7)
Glucose, Bld: 89 mg/dL (ref 65–99)
Potassium: 5.4 mmol/L — ABNORMAL HIGH (ref 3.5–5.3)
Sodium: 144 mmol/L (ref 135–146)
Total Bilirubin: 0.8 mg/dL (ref 0.2–1.2)
Total Protein: 7.2 g/dL (ref 6.1–8.1)
eGFR: 109 mL/min/{1.73_m2} (ref >=60)

## 2021-02-22 LAB — CBC WITH DIFFERENTIAL/PLATELET
Absolute Monocytes: 416 cells/uL (ref 200–950)
Basophils Absolute: 52 cells/uL (ref 0–200)
Basophils Relative: 1 %
Eosinophils Absolute: 140 cells/uL (ref 15–500)
Eosinophils Relative: 2.7 %
HCT: 40.2 % (ref 35.0–45.0)
Hemoglobin: 13.2 g/dL (ref 11.7–15.5)
Lymphs Abs: 1654 cells/uL (ref 850–3900)
MCH: 30.1 pg (ref 27.0–33.0)
MCHC: 32.8 g/dL (ref 32.0–36.0)
MCV: 91.6 fL (ref 80.0–100.0)
MPV: 9.7 fL (ref 7.5–12.5)
Monocytes Relative: 8 %
Neutro Abs: 2938 cells/uL (ref 1500–7800)
Neutrophils Relative %: 56.5 %
Platelets: 262 10*3/uL (ref 140–400)
RBC: 4.39 10*6/uL (ref 3.80–5.10)
RDW: 13.5 % (ref 11.0–15.0)
Total Lymphocyte: 31.8 %
WBC: 5.2 10*3/uL (ref 3.8–10.8)

## 2021-02-22 LAB — TSH: TSH: 2.36 mIU/L (ref 0.40–4.50)

## 2021-02-23 ENCOUNTER — Ambulatory Visit: Payer: Self-pay | Admitting: Orthopedic Surgery

## 2021-02-23 DIAGNOSIS — M1732 Unilateral post-traumatic osteoarthritis, left knee: Secondary | ICD-10-CM

## 2021-02-28 ENCOUNTER — Other Ambulatory Visit: Payer: Self-pay

## 2021-02-28 ENCOUNTER — Encounter: Payer: Self-pay | Admitting: Internal Medicine

## 2021-02-28 ENCOUNTER — Ambulatory Visit (INDEPENDENT_AMBULATORY_CARE_PROVIDER_SITE_OTHER): Payer: Medicare Other | Admitting: Internal Medicine

## 2021-02-28 VITALS — BP 128/88 | HR 80 | Temp 97.8°F | Ht 64.0 in | Wt 177.0 lb

## 2021-02-28 DIAGNOSIS — Z0000 Encounter for general adult medical examination without abnormal findings: Secondary | ICD-10-CM

## 2021-02-28 DIAGNOSIS — Z23 Encounter for immunization: Secondary | ICD-10-CM

## 2021-02-28 DIAGNOSIS — J301 Allergic rhinitis due to pollen: Secondary | ICD-10-CM | POA: Diagnosis not present

## 2021-02-28 DIAGNOSIS — K219 Gastro-esophageal reflux disease without esophagitis: Secondary | ICD-10-CM

## 2021-02-28 DIAGNOSIS — Z9882 Breast implant status: Secondary | ICD-10-CM

## 2021-02-28 DIAGNOSIS — R8290 Unspecified abnormal findings in urine: Secondary | ICD-10-CM | POA: Diagnosis not present

## 2021-02-28 DIAGNOSIS — E785 Hyperlipidemia, unspecified: Secondary | ICD-10-CM

## 2021-02-28 DIAGNOSIS — Z8616 Personal history of COVID-19: Secondary | ICD-10-CM

## 2021-02-28 DIAGNOSIS — M1712 Unilateral primary osteoarthritis, left knee: Secondary | ICD-10-CM

## 2021-02-28 LAB — POCT URINALYSIS DIPSTICK
Bilirubin, UA: NEGATIVE
Glucose, UA: NEGATIVE
Leukocytes, UA: NEGATIVE
Nitrite, UA: NEGATIVE
Protein, UA: NEGATIVE
Spec Grav, UA: 1.02 (ref 1.010–1.025)
Urobilinogen, UA: 0.2 E.U./dL
pH, UA: 5 (ref 5.0–8.0)

## 2021-02-28 NOTE — Progress Notes (Signed)
IElby Showers, MD, have reviewed all documentation for this visit. The documentation on 03/20/21 for the exam, diagnosis, procedures, and orders are all accurate and complete. Subjective:   Patient presents for Medicare Annual/Subsequent preventive examination.  Risk Factors  Current exercise habits: walks 5 miles daily Dietary issues discussed: yes  Cardiac risk factors:advanced age (older than 66 for men, 14 for women) and dyslipidemia  Depression Screen  (Note: if answer to either of the following is "Yes", a more complete depression screening is indicated)   Over the past two weeks, have you felt down, depressed or hopeless? No Over the past two weeks, have you felt little interest or pleasure in doing things? No Have you lost interest or pleasure in daily life? No Do you often feel hopeless? No Do you cry easily over simple problems? No  Activities of Daily Living  In your present state of health, do you have any difficulty performing the following activities?:   Driving? No Managing money? No Feeding yourself? No Getting from bed to chair?No Climbing a flight of stairs?  Yes Preparing food and eating?:  No Bathing or showering?   No Getting dressed:  No Getting to the toilet?  No Using the toilet:  No Moving around from place to place:  No In the past year have you fallen or had a near fall?  No Are you sexually active?  No Do you have more than one partner?  No  Hearing Difficulties:    Do you often ask people to speak up or repeat themselves?  Yes Do you experience ringing or noises in your ears?   No Do you have difficulty understanding soft or whispered voices?  Yes Do you feel that you have a problem with memory?  No  Do you often misplace items?  No   Home Safety:  Do you have a smoke alarm at your residence? Yes Do you have grab bars in the bathroom?  No Do you have throw rugs in your house?   Yes   Cognitive Testing  Alert? Yes Normal  Appearance?Yes  Oriented to person? Yes Place? Yes  Time? Yes  Recall of three objects? Yes  Can perform simple calculations? Yes  Displays appropriate judgment?Yes  Can read the correct time from a watch face?Yes   List the Names of Other Physician/Practitioners you currently use:  See referral list for the physicians patient is currently seeing.    Patient Instructions (the written plan) was given to the patient.  Medicare Attestation  I have personally reviewed:  The patient's medical and social history  Their use of alcohol, tobacco or illicit drugs  Their current medications and supplements  The patient's functional ability including ADLs,fall risks, home safety risks, cognitive, and hearing and visual impairment  Diet and physical activities  Evidence for depression or mood disorders  The patient's weight, height, BMI, and visual acuity have been recorded in the chart. I have made referrals, counseling, and provided education to the patient based on review of the above and I have provided the patient with a written personalized care plan for preventive services.   Patient seen for health maintenance exam and evaluation of medical issues.  To have Left knee arthroplasty in December.EKG was done 2021.  Hx of GERD and hyperlipidemia.  GYN is DR. Meisinger.  Colonoscopy done with repeat in 7 years.Mammogram done in July 2022.  Tdap due next year. Had 2 Moderna vaccines. Had mild case of Covid 2020.

## 2021-03-01 LAB — URINE CULTURE
MICRO NUMBER:: 12547979
Result:: NO GROWTH
SPECIMEN QUALITY:: ADEQUATE

## 2021-03-01 LAB — URINALYSIS, MICROSCOPIC ONLY
Bacteria, UA: NONE SEEN /HPF
Hyaline Cast: NONE SEEN /LPF
WBC, UA: NONE SEEN /HPF (ref 0–5)

## 2021-03-01 NOTE — Progress Notes (Signed)
Annual Wellness Visit     Patient: Julia Murray, Female    DOB: 02-May-1956, 65 y.o.   MRN: [id number] Visit Date: 02/28/2021  Chief Complaint  Patient presents with   Medicare Wellness   Annual Exam   Subjective    Julia Murray is a 65 y.o. Female who presents today for her Annual Medicare Wellness Visit.  HPI  Patient seen for health maintenance exam and evaluation of medical issues.  To have Left knee arthroplasty in December.  Hx of GERD and hyperlipidemia.  GYN is DR. Meisinger.  Colonoscopy done February 2022 with repeat study recommended in 7 years.  Mammogram done in July 2022.  Tdap due next year. Had 2 Moderna Covid vaccines. Had mild case of Covid 2020.    Patient Care Team: Elby Showers, MD as PCP - General (Internal Medicine) Susa Day, MD as Attending Physician (Orthopedic Surgery)  Review of Systems main issue is left knee pain   Objective    Vitals: BP 128/88   Pulse 80   Temp 97.8 F (36.6 C) (Tympanic)   Ht 5\' 4"  (1.626 m)   Wt 177 lb (80.3 kg)   SpO2 96%   BMI 30.38 kg/m   Physical Exam  Skin: Warm and dry.  No cervical adenopathy.  TMs are clear.  Neck is supple.  No carotid bruits.  No thyromegaly.  Chest clear to auscultation.  Breasts are without masses.  Has bilateral breast implants.  Cardiac exam: Regular rate and rhythm without ectopy or murmur.  Abdomen soft nondistended without hepatosplenomegaly masses or tenderness.  No lower extremity pitting edema.  Neurological exam is intact without gross focal deficits.  Affect, thought, and judgment are normal.   Most recent functional status assessment: In your present state of health, do you have any difficulty performing the following activities: 02/28/2021  Hearing? Y  Vision? N  Difficulty concentrating or making decisions? N  Walking or climbing stairs? Y  Comment knee surgery  Dressing or bathing? N  Doing errands, shopping? N  Preparing Food and eating ? N   Using the Toilet? N  In the past six months, have you accidently leaked urine? N  Do you have problems with loss of bowel control? N  Managing your Medications? N  Managing your Finances? N  Housekeeping or managing your Housekeeping? N  Some recent data might be hidden   Most recent fall risk assessment: Fall Risk  02/28/2021  Falls in the past year? 0  Number falls in past yr: 0  Injury with Fall? 0  Risk for fall due to : No Fall Risks  Follow up Falls evaluation completed    Most recent depression screenings 02/28/21 6CIT Screen 02/28/2021  What Year? 0 points  What month? 0 points  What time? 0 points  Count back from 20 0 points  Months in reverse 0 points  Repeat phrase 2 points  Total Score 2       Assessment & Plan    Normal thought, affect, and judgement. No memory concerns.    Annual wellness visit done today including the all of the following: Reviewed patient's Family Medical History Reviewed and updated list of patient's medical providers Assessment of cognitive impairment was done Assessed patient's functional ability Established a written schedule for health screening [place] Completed and Reviewed  Discussed health benefits of physical activity, and encouraged her to engage in regular exercise appropriate for her age and condition.  IElby Showers, MD, have reviewed all documentation for this visit. The documentation on 03/01/21 for the exam, diagnosis, procedures, and orders are all accurate and complete.    Elby Showers, MD    Patient presents for health maintenance exam and evaluation of medical issues.  History of GE reflux, hyperlipidemia, vitamin D deficiency and osteoarthritis of her knees.  Dr. Tonita Cong has performed steroid injections in her knees in the past.  She has a history of mixed hyperlipidemia treated with Lipitor.  Has taken Mobic for musculoskeletal pain and Prevacid for GE reflux.  Uses  Flonase nasal spray for allergy symptoms and has taken vitamin D supplementation.  No known drug allergies  Fractured hand in the 1980s.  Torn anterior cruciate ligament 2007.  Right knee arthroscopic surgery by Dr. Tonita Cong December 2013.  Had significant anterior cruciate ligament disease, bilateral mid menisci tears and loose bodies.  Subsequently developed infection postoperatively and had to be readmitted January 2014 for IV antibiotics, right knee drainage and wound dehiscence.  In December 2009 she had a bout of ischemic colitis and was hospitalized in [place], [place].  This was left-sided ischemic colitis which was treated with Flagyl and Levaquin.  She required a blood transfusion.  Biopsies were compatible with ischemic colitis.  Symptoms started with a sudden bout of abdominal pain after returning from eating a steak dinner.  She has never had a recurrence.  She was seen by Dr. Fuller Plan for follow-up.  Had colonoscopy in 2011 and again in February 2022.  Social history: Retired as a Patent examiner for [employer].  She is married.  Husband is vice president of vein oil company.  Patient formerly worked as a Statistician.  Completed 2 years of college.  Does not smoke.  Social alcohol consumption.  She has an adult daughter and a stepson.  Family history: Father died at age 38 of heart failure.  Mother with history of diabetes.  1 brother in good health.  Father had gout, kidney stones and ulcers in addition to heart disease.  Patient saw Dr. Virl Cagey, vascular surgeon in September for recommendations regarding perioperative coagulation with known history of DVT.  Several years ago she underwent knee arthroscopy and postoperatively had DVT.  It was not treated as it was thought to be old thrombus due to echogenicity.  Since she is having TKA, consultation was sought from vascular surgery.  She remains active.  She was found to have normal dorsalis pedis and posterior tibial pulses  bilaterally.  She had noninvasive vascular imaging showing chronic partially occlusive thrombus now appearing laminar on the wall of the left popliteal vein.  Vascular surgeon thought patient could simply have perioperative prophylactic DVT PE dosing with upcoming surgery.  Labs reviewed show that she has a very high total cholesterol 264 however her HDL is very high at 135, normal triglycerides at 115 and LDL of 108.  She exercises a great deal.  CBC is within normal limits.  Potassium was 5.4 and felt to be slightly hemolyzed.  TSH was normal.  EKG in October 2021 for welcome to Medicare EKG was entirely normal.  Skin: Warm and dry.  Nodes none.  TMs clear.  Neck supple.  No thyromegaly, JVD or carotid bruits.  Chest clear to auscultation.  Cardiac exam: Regular rate and rhythm without ectopy.  Abdomen soft nondistended without hepatosplenomegaly masses or tenderness.  No lower extremity pitting edema.  Neuro is intact without gross focal deficits.  GYN exam  deferred to Dr. Willis Modena.  Impression: Review of recent labs shows High HDL cholesterol making total cholesterol look high.  She has a history of hypertriglyceridemia and in 2018 her LDL was elevated at 172.  She is maintained on atorvastatin 20 mg daily.  Osteoarthritis of knee to have lab TKA in December by Dr. Tonita Cong  History of GE reflux treated with Prevacid  History of allergic rhinitis treated with Flonase nasal spray  History of HSV-takes Valtrex 500 mg once weekly for prevention  Hx of ischemic colitis 2009.  Hx of Covid-19- had mild case 2020  Plan: Stable to undergo left TKA in December. Continue current meds and RTC in 6 months.

## 2021-03-13 ENCOUNTER — Other Ambulatory Visit: Payer: Self-pay | Admitting: Internal Medicine

## 2021-03-20 NOTE — Patient Instructions (Signed)
It was a pleasure to see you today.  Continue current medications and follow-up in 6 months with regard to hyperlipidemia.  Good luck with upcoming knee surgery.

## 2021-03-23 ENCOUNTER — Other Ambulatory Visit: Payer: Self-pay | Admitting: Internal Medicine

## 2021-04-17 ENCOUNTER — Ambulatory Visit: Payer: Self-pay | Admitting: Orthopedic Surgery

## 2021-04-17 NOTE — H&P (Signed)
Julia Murray is an 65 y.o. female.   Chief Complaint: left knee pain HPI: Patient is here for her H&P. She is scheduled for hardware removal (ACL screws) and total knee replacement on the left by Dr. Tonita Cong on 04/28/21 at [facility].  Dr. Tonita Cong and the patient mutually agreed to proceed with a total knee replacement. Risks and benefits of the procedure were discussed including stiffness, suboptimal range of motion, persistent pain, infection requiring removal of prosthesis and reinsertion, need for prophylactic antibiotics in the future, for example, dental procedures, possible need for manipulation, revision in the future and also anesthetic complications including DVT, PE, etc. We discussed the perioperative course, time in the hospital, postoperative recovery and the need for elevation to control swelling. We also discussed the predicted range of motion and the probability that squatting and kneeling would be unobtainable in the future. In addition, postoperative anticoagulation was discussed. We have obtained preoperative medical clearance as necessary. Provided illustrated handout and discussed it in detail. They will enroll in the total joint replacement educational forum at the hospital.  She does have a prior history of a superficial DVT, was cleared by her vascular specialist Dr. Virl Cagey, who did not recommend prophylactic full anticoagulation. Recommended standard perioperative prophylactic DVT dosing with aspirin.  Past Medical History:  Diagnosis Date   Anxiety    GERD (gastroesophageal reflux disease)    Hyperlipidemia    Vitamin D deficiency disease     Past Surgical History:  Procedure Laterality Date   ANTERIOR CRUCIATE LIGAMENT REPAIR     left   AUGMENTATION MAMMAPLASTY Bilateral    BREAST BIOPSY Bilateral    multiple times per patient   BREAST SURGERY     mutiple bx   COLONOSCOPY  04/04/2008   at [facility] done   KNEE ARTHROSCOPY  05/20/2012   Procedure:  ARTHROSCOPY KNEE;  Surgeon: Johnn Hai, MD;  Location: [facility];  Service: Orthopedics;  Laterality: Right;  right knee scope with incision and drainage and removal of loose foreign body    KNEE ARTHROSCOPY WITH MEDIAL MENISECTOMY  04/25/2012   Procedure: KNEE ARTHROSCOPY WITH MEDIAL MENISECTOMY;  Surgeon: Johnn Hai, MD;  Location: [facility];  Service: Orthopedics;  Laterality: Right;  Right Knee Arthrscopy with Partial Medial and Lateral Menisectomy with Removal of Loose Bodies   TONSILLECTOMY      Family History  Problem Relation Age of Onset   Heart disease Father    Heart attack Father    Diabetes Mother    Colon cancer Neg Hx    Esophageal cancer Neg Hx    Rectal cancer Neg Hx    Stomach cancer Neg Hx    Colon polyps Neg Hx    Social History:  reports that she has never smoked. She has never used smokeless tobacco. She reports current alcohol use of about 4.0 standard drinks per week. She reports that she does not use drugs.  Allergies:  Allergies  Allergen Reactions   Shellfish Allergy Hives and Swelling    Pt States betadine ok   Bee Venom Swelling   Crab Extract Allergy Skin Test Hives and Swelling   Current meds: acetaminophen 300 mg-codeine 30 mg tablet atorvastatin 20 mg tablet chlorhexidine gluconate 4 % topical liquid fluticasone propionate 50 mcg/actuation nasal spray,suspension meloxicam 15 mg tablet mupirocin 2 % topical ointment potassium valACYclovir 500 mg tablet Vitamin D  Review of Systems  Constitutional: Negative.   HENT: Negative.  Eyes: Negative.   Respiratory: Negative.    Cardiovascular: Negative.   Gastrointestinal: Negative.   Endocrine: Negative.   Genitourinary: Negative.   Musculoskeletal:  Positive for arthralgias, gait problem, joint swelling and myalgias.  Skin: Negative.   Hematological: Negative.   Psychiatric/Behavioral: Negative.     There were no vitals taken for this  visit. Physical Exam Constitutional:      Appearance: Normal appearance.  HENT:     Head: Normocephalic.     Right Ear: External ear normal.     Left Ear: External ear normal.     Nose: Nose normal.     Mouth/Throat:     Pharynx: Oropharynx is clear.  Eyes:     Conjunctiva/sclera: Conjunctivae normal.  Cardiovascular:     Rate and Rhythm: Normal rate and regular rhythm.     Pulses: Normal pulses.  Pulmonary:     Effort: Pulmonary effort is normal.     Breath sounds: Normal breath sounds.  Abdominal:     General: Bowel sounds are normal.  Musculoskeletal:     Cervical back: Normal range of motion.     Comments: Left knee tender to medial lateral joint line. Patellofemoral pain compression. Ranges 0-100. Some swelling is noted in the leg which is chronic. No Homans' sign no DVT.  Skin:    General: Skin is warm and dry.  Neurological:     Mental Status: She is alert.     X-rays of the knee demonstrate end-stage osteoarthrosis medial lateral compartment patellofemoral joint. With retained ACL screws  Assessment/Plan Impression: Left knee end-stage osteoarthritis, retained hardware, ACL screws from prior ACL reconstruction  Plan:Pt with end-stage left knee DJD, bone-on-bone, refractory to conservative tx, scheduled for left total knee replacement by Dr. Tonita Cong on Dec 23. We again discussed the procedure itself as well as risks, complications and alternatives, including but not limited to DVT, PE, infx, bleeding, failure of procedure, need for secondary procedure including manipulation, nerve injury, ongoing pain/symptoms, anesthesia risk, even stroke or death. Also discussed typical post-op protocols, activity restrictions, need for PT, flexion/extension exercises, time out of work. Discussed need for DVT ppx post-op per protocol. Discussed dental ppx and infx prevention. Also discussed limitations post-operatively such as kneeling and squatting. All questions were answered. Patient  desires to proceed with surgery as scheduled.  Will hold supplements, ASA and NSAIDs accordingly. Will remain NPO after midnight the night before surgery. Will present to [facility] for pre-op testing. Anticipate hospital stay to include at least 2 midnights given medical history and to ensure proper pain control. Plan aspirin twice a day for DVT ppx post-op. Plan oxycodone, Robaxin, Colace, Miralax. Plan to start outpatient PT postoperatively at [facility] which is a mile from her house, with her husband at home to help her as well. Will follow up 10-14 days post-op for suture removal and xrays. She is asked about something for pain in the interim as she is to hold her meloxicam, after review of her drug monitoring report I have sent and Tylenol with Codeine for her to take as needed. She preferred something not as strong as oxycodone before surgery. She really has a commode, walker and crutches at home.  Plan LEFT total knee replacement, removal of hardware (ACL screws)  Cecilie Kicks, PA-C for Dr Tonita Cong 04/17/2021, 12:24 PM

## 2021-04-17 NOTE — Patient Instructions (Addendum)
DUE TO COVID-19 ONLY ONE VISITOR IS ALLOWED TO COME WITH YOU AND STAY IN THE WAITING ROOM ONLY DURING PRE OP AND PROCEDURE DAY OF SURGERY IF YOU ARE GOING HOME AFTER SURGERY. IF YOU ARE SPENDING THE NIGHT 2 PEOPLE MAY VISIT WITH YOU IN YOUR PRIVATE ROOM AFTER SURGERY UNTIL VISITING  HOURS ARE OVER AT 8:00 PM AND 1  VISITOR  MAY  SPEND THE NIGHT.   YOU NEED TO HAVE A COVID 19 TEST ON__12/21___THIS TEST MUST BE DONE BEFORE SURGERY,  COVID TESTING SITE  IS LOCATED AT [place], [place]. REMAIN IN YOUR CAR THIS IS A DRIVE UP TEST. AFTER YOUR COVID TEST PLEASE WEAR A MASK OUT IN PUBLIC AND SOCIAL DISTANCE AND [place] YOUR HANDS FREQUENTLY, ALSO ASK ALL YOUR CLOSE CONTACT PERSONS TO WEAR A MASK AND SOCIAL DISTANCE AND [place] THEIR HANDS FREQUENTLY ALSO.               Julia Murray     Your procedure is scheduled on:04/28/21    Report to [facility] Main  Entrance   Report to admitting at 10:30 AM     Call this number if you have problems the morning of surgery [phone]   No food after midnight.    You may have clear liquid until 10:00 AM.    At 9:30 AM drink pre surgery drink.   Nothing by mouth after 10:00 AM.     CLEAR LIQUID DIET   Foods Allowed                                                                     Foods Excluded  Coffee and tea, regular and decaf                             liquids that you cannot  Plain Jell-O any favor except red or purple                                           see through such as: Fruit ices (not with fruit pulp)                                     milk, soups, orange juice  Iced Popsicles                                    All solid food Carbonated beverages, regular and diet                                    Cranberry, grape and apple juices Sports drinks like Gatorade Lightly seasoned clear broth or consume(fat free) Sugar     BRUSH YOUR TEETH MORNING OF SURGERY AND RINSE YOUR MOUTH OUT, NO CHEWING GUM CANDY  OR MINTS.     Take these medicines the morning of surgery with  A SIP OF WATER: none                                You may not have any metal on your body including hair pins and              piercings  Do not wear jewelry, make-up, lotions, powders or perfumes, deodorant             Do not wear nail polish on your fingernails.  Do not shave  48 hours prior to surgery.             .   Do not bring valuables to the hospital. [place].  Contacts, dentures or bridgework may not be worn into surgery.  Leave suitcase in the car. After surgery it may be brought to your room.   _____________________________________________________________________             [facility] - Preparing for Surgery Before surgery, you can play an important role.  Because skin is not sterile, your skin needs to be as free of germs as possible.  You can reduce the number of germs on your skin by washing with CHG (chlorahexidine gluconate) soap before surgery.  CHG is an antiseptic cleaner which kills germs and bonds with the skin to continue killing germs even after washing. Please DO NOT use if you have an allergy to CHG or antibacterial soaps.  If your skin becomes reddened/irritated stop using the CHG and inform your nurse when you arrive at Short Stay. Do not shave (including legs and underarms) for at least 48 hours prior to the first CHG shower.   Please follow these instructions carefully:  1.  Shower with CHG Soap the night before surgery and the  morning of Surgery.  2.  If you choose to wash your hair, wash your hair first as usual with your  normal  shampoo.  3.  After you shampoo, rinse your hair and body thoroughly to remove the  shampoo.                            4.  Use CHG as you would any other liquid soap.  You can apply chg directly  to the skin and wash                       Gently with a scrungie or clean washcloth.  5.  Apply the CHG Soap to  your body ONLY FROM THE NECK DOWN.   Do not use on face/ open                           Wound or open sores. Avoid contact with eyes, ears mouth and genitals (private parts).                       Wash face,  Genitals (private parts) with your normal soap.             6.  Wash thoroughly, paying special attention to the area where your surgery  will be performed.  7.  Thoroughly rinse your body with warm water from the neck down.  8.  DO NOT shower/wash with  your normal soap after using and rinsing off  the CHG Soap.             9.  Pat yourself dry with a clean towel.            10.  Wear clean pajamas.            11.  Place clean sheets on your bed the night of your first shower and do not  sleep with pets. Day of Surgery : Do not apply any lotions/deodorants the morning of surgery.  Please wear clean clothes to the hospital/surgery center.  FAILURE TO FOLLOW THESE INSTRUCTIONS MAY RESULT IN THE CANCELLATION OF YOUR SURGERY PATIENT SIGNATURE_________________________________  NURSE SIGNATURE__________________________________  ________________________________________________________________________   Adam Phenix  An incentive spirometer is a tool that can help keep your lungs clear and active. This tool measures how well you are filling your lungs with each breath. Taking long deep breaths may help reverse or decrease the chance of developing breathing (pulmonary) problems (especially infection) following: A long period of time when you are unable to move or be active. BEFORE THE PROCEDURE  If the spirometer includes an indicator to show your best effort, your nurse or respiratory therapist will set it to a desired goal. If possible, sit up straight or lean slightly forward. Try not to slouch. Hold the incentive spirometer in an upright position. INSTRUCTIONS FOR USE  Sit on the edge of your bed if possible, or sit up as far as you can in bed or on a chair. Hold the incentive  spirometer in an upright position. Breathe out normally. Place the mouthpiece in your mouth and seal your lips tightly around it. Breathe in slowly and as deeply as possible, raising the piston or the ball toward the top of the column. Hold your breath for 3-5 seconds or for as long as possible. Allow the piston or ball to fall to the bottom of the column. Remove the mouthpiece from your mouth and breathe out normally. Rest for a few seconds and repeat Steps 1 through 7 at least 10 times every 1-2 hours when you are awake. Take your time and take a few normal breaths between deep breaths. The spirometer may include an indicator to show your best effort. Use the indicator as a goal to work toward during each repetition. After each set of 10 deep breaths, practice coughing to be sure your lungs are clear. If you have an incision (the cut made at the time of surgery), support your incision when coughing by placing a pillow or rolled up towels firmly against it. Once you are able to get out of bed, walk around indoors and cough well. You may stop using the incentive spirometer when instructed by your caregiver.  RISKS AND COMPLICATIONS Take your time so you do not get dizzy or light-headed. If you are in pain, you may need to take or ask for pain medication before doing incentive spirometry. It is harder to take a deep breath if you are having pain. AFTER USE Rest and breathe slowly and easily. It can be helpful to keep track of a log of your progress. Your caregiver can provide you with a simple table to help with this. If you are using the spirometer at home, follow these instructions: [place] IF:  You are having difficultly using the spirometer. You have trouble using the spirometer as often as instructed. Your pain medication is not giving enough relief while using the spirometer.  You develop fever of 100.5 F (38.1 C) or higher. SEEK IMMEDIATE MEDICAL CARE IF:  You cough up bloody  sputum that had not been present before. You develop fever of 102 F (38.9 C) or greater. You develop worsening pain at or near the incision site. MAKE SURE YOU:  Understand these instructions. Will watch your condition. Will get help right away if you are not doing well or get worse. Document Released: 09/03/2006 Document Revised: 07/16/2011 Document Reviewed: 11/04/2006 [facility] Patient Information 2014 [place], [place].   ________________________________________________________________________

## 2021-04-17 NOTE — H&P (View-Only) (Signed)
Julia Murray is an 65 y.o. female.   Chief Complaint: left knee pain HPI: Patient is here for her H&P. She is scheduled for hardware removal (ACL screws) and total knee replacement on the left by Dr. Tonita Cong on 04/28/21 at [facility].  Dr. Tonita Cong and the patient mutually agreed to proceed with a total knee replacement. Risks and benefits of the procedure were discussed including stiffness, suboptimal range of motion, persistent pain, infection requiring removal of prosthesis and reinsertion, need for prophylactic antibiotics in the future, for example, dental procedures, possible need for manipulation, revision in the future and also anesthetic complications including DVT, PE, etc. We discussed the perioperative course, time in the hospital, postoperative recovery and the need for elevation to control swelling. We also discussed the predicted range of motion and the probability that squatting and kneeling would be unobtainable in the future. In addition, postoperative anticoagulation was discussed. We have obtained preoperative medical clearance as necessary. Provided illustrated handout and discussed it in detail. They will enroll in the total joint replacement educational forum at the hospital.  She does have a prior history of a superficial DVT, was cleared by her vascular specialist Dr. Virl Cagey, who did not recommend prophylactic full anticoagulation. Recommended standard perioperative prophylactic DVT dosing with aspirin.  Past Medical History:  Diagnosis Date   Anxiety    GERD (gastroesophageal reflux disease)    Hyperlipidemia    Vitamin D deficiency disease     Past Surgical History:  Procedure Laterality Date   ANTERIOR CRUCIATE LIGAMENT REPAIR     left   AUGMENTATION MAMMAPLASTY Bilateral    BREAST BIOPSY Bilateral    multiple times per patient   BREAST SURGERY     mutiple bx   COLONOSCOPY  04/04/2008   at [facility] done   KNEE ARTHROSCOPY  05/20/2012   Procedure:  ARTHROSCOPY KNEE;  Surgeon: Johnn Hai, MD;  Location: [facility];  Service: Orthopedics;  Laterality: Right;  right knee scope with incision and drainage and removal of loose foreign body    KNEE ARTHROSCOPY WITH MEDIAL MENISECTOMY  04/25/2012   Procedure: KNEE ARTHROSCOPY WITH MEDIAL MENISECTOMY;  Surgeon: Johnn Hai, MD;  Location: [place];  Service: Orthopedics;  Laterality: Right;  Right Knee Arthrscopy with Partial Medial and Lateral Menisectomy with Removal of Loose Bodies   TONSILLECTOMY      Family History  Problem Relation Age of Onset   Heart disease Father    Heart attack Father    Diabetes Mother    Colon cancer Neg Hx    Esophageal cancer Neg Hx    Rectal cancer Neg Hx    Stomach cancer Neg Hx    Colon polyps Neg Hx    Social History:  reports that she has never smoked. She has never used smokeless tobacco. She reports current alcohol use of about 4.0 standard drinks per week. She reports that she does not use drugs.  Allergies:  Allergies  Allergen Reactions   Shellfish Allergy Hives and Swelling    Pt States betadine ok   Bee Venom Swelling   Crab Extract Allergy Skin Test Hives and Swelling   Current meds: acetaminophen 300 mg-codeine 30 mg tablet atorvastatin 20 mg tablet chlorhexidine gluconate 4 % topical liquid fluticasone propionate 50 mcg/actuation nasal spray,suspension meloxicam 15 mg tablet mupirocin 2 % topical ointment potassium valACYclovir 500 mg tablet Vitamin D  Review of Systems  Constitutional: Negative.   HENT: Negative.  Eyes: Negative.   Respiratory: Negative.    Cardiovascular: Negative.   Gastrointestinal: Negative.   Endocrine: Negative.   Genitourinary: Negative.   Musculoskeletal:  Positive for arthralgias, gait problem, joint swelling and myalgias.  Skin: Negative.   Hematological: Negative.   Psychiatric/Behavioral: Negative.     There were no vitals taken for this  visit. Physical Exam Constitutional:      Appearance: Normal appearance.  HENT:     Head: Normocephalic.     Right Ear: External ear normal.     Left Ear: External ear normal.     Nose: Nose normal.     Mouth/Throat:     Pharynx: Oropharynx is clear.  Eyes:     Conjunctiva/sclera: Conjunctivae normal.  Cardiovascular:     Rate and Rhythm: Normal rate and regular rhythm.     Pulses: Normal pulses.  Pulmonary:     Effort: Pulmonary effort is normal.     Breath sounds: Normal breath sounds.  Abdominal:     General: Bowel sounds are normal.  Musculoskeletal:     Cervical back: Normal range of motion.     Comments: Left knee tender to medial lateral joint line. Patellofemoral pain compression. Ranges 0-100. Some swelling is noted in the leg which is chronic. No Homans' sign no DVT.  Skin:    General: Skin is warm and dry.  Neurological:     Mental Status: She is alert.     X-rays of the knee demonstrate end-stage osteoarthrosis medial lateral compartment patellofemoral joint. With retained ACL screws  Assessment/Plan Impression: Left knee end-stage osteoarthritis, retained hardware, ACL screws from prior ACL reconstruction  Plan:Pt with end-stage left knee DJD, bone-on-bone, refractory to conservative tx, scheduled for left total knee replacement by Dr. Tonita Cong on Dec 23. We again discussed the procedure itself as well as risks, complications and alternatives, including but not limited to DVT, PE, infx, bleeding, failure of procedure, need for secondary procedure including manipulation, nerve injury, ongoing pain/symptoms, anesthesia risk, even stroke or death. Also discussed typical post-op protocols, activity restrictions, need for PT, flexion/extension exercises, time out of work. Discussed need for DVT ppx post-op per protocol. Discussed dental ppx and infx prevention. Also discussed limitations post-operatively such as kneeling and squatting. All questions were answered. Patient  desires to proceed with surgery as scheduled.  Will hold supplements, ASA and NSAIDs accordingly. Will remain NPO after midnight the night before surgery. Will present to [facility] for pre-op testing. Anticipate hospital stay to include at least 2 midnights given medical history and to ensure proper pain control. Plan aspirin twice a day for DVT ppx post-op. Plan oxycodone, Robaxin, Colace, Miralax. Plan to start outpatient PT postoperatively at [facility] which is a mile from her house, with her husband at home to help her as well. Will follow up 10-14 days post-op for suture removal and xrays. She is asked about something for pain in the interim as she is to hold her meloxicam, after review of her drug monitoring report I have sent and Tylenol with Codeine for her to take as needed. She preferred something not as strong as oxycodone before surgery. She really has a commode, walker and crutches at home.  Plan LEFT total knee replacement, removal of hardware (ACL screws)  Cecilie Kicks, PA-C for Dr Tonita Cong 04/17/2021, 12:24 PM

## 2021-04-18 ENCOUNTER — Encounter (HOSPITAL_COMMUNITY)
Admission: RE | Admit: 2021-04-18 | Discharge: 2021-04-18 | Disposition: A | Discharge status: Home / Self Care | Payer: Medicare Other | Source: Ambulatory Visit | Attending: Specialist | Admitting: Specialist

## 2021-04-18 ENCOUNTER — Other Ambulatory Visit: Payer: Self-pay

## 2021-04-18 ENCOUNTER — Encounter (HOSPITAL_COMMUNITY): Payer: Self-pay

## 2021-04-18 VITALS — BP 131/81 | HR 67 | Temp 98.3°F | Resp 18 | Ht 66.0 in | Wt 174.0 lb

## 2021-04-18 DIAGNOSIS — Z01812 Encounter for preprocedural laboratory examination: Secondary | ICD-10-CM | POA: Diagnosis not present

## 2021-04-18 DIAGNOSIS — Z01818 Encounter for other preprocedural examination: Secondary | ICD-10-CM

## 2021-04-18 DIAGNOSIS — M1732 Unilateral post-traumatic osteoarthritis, left knee: Secondary | ICD-10-CM

## 2021-04-18 HISTORY — DX: Unspecified osteoarthritis, unspecified site: M19.90

## 2021-04-18 LAB — PROTIME-INR
INR: 0.9 (ref 0.8–1.2)
Prothrombin Time: 12.1 seconds (ref 11.4–15.2)

## 2021-04-18 LAB — URINALYSIS, ROUTINE W REFLEX MICROSCOPIC
Bilirubin Urine: NEGATIVE
Glucose, UA: NEGATIVE mg/dL
Hgb urine dipstick: NEGATIVE
Ketones, ur: NEGATIVE mg/dL
Leukocytes,Ua: NEGATIVE
Nitrite: NEGATIVE
Protein, ur: NEGATIVE mg/dL
Specific Gravity, Urine: 1.017 (ref 1.005–1.030)
pH: 7 (ref 5.0–8.0)

## 2021-04-18 LAB — BASIC METABOLIC PANEL
Anion gap: 8 (ref 5–15)
BUN: 23 mg/dL (ref 8–23)
CO2: 28 mmol/L (ref 22–32)
Calcium: 9.5 mg/dL (ref 8.9–10.3)
Chloride: 104 mmol/L (ref 98–111)
Creatinine, Ser: 0.46 mg/dL (ref 0.44–1.00)
GFR, Estimated: >60 mL/min (ref >60)
Glucose, Bld: 100 mg/dL — ABNORMAL HIGH (ref 70–99)
Potassium: 4.8 mmol/L (ref 3.5–5.1)
Sodium: 140 mmol/L (ref 135–145)

## 2021-04-18 LAB — CBC
HCT: 41 % (ref 36.0–46.0)
Hemoglobin: 13 g/dL (ref 12.0–15.0)
MCH: 30.2 pg (ref 26.0–34.0)
MCHC: 31.7 g/dL (ref 30.0–36.0)
MCV: 95.1 fL (ref 80.0–100.0)
Platelets: 264 10*3/uL (ref 150–400)
RBC: 4.31 MIL/uL (ref 3.87–5.11)
RDW: 14.4 % (ref 11.5–15.5)
WBC: 5.2 10*3/uL (ref 4.0–10.5)
nRBC: 0 % (ref 0.0–0.2)

## 2021-04-18 LAB — APTT: aPTT: 29 seconds (ref 24–36)

## 2021-04-18 LAB — SURGICAL PCR SCREEN
MRSA, PCR: NEGATIVE
Staphylococcus aureus: NEGATIVE

## 2021-04-18 NOTE — Progress Notes (Signed)
COVID test- 12/21  PCP - Dr. Lenice Llamas Cardiologist - none  Chest x-ray - no EKG - no Stress Test - no ECHO - no Cardiac Cath - no Pacemaker/ICD device last checked:NA  Sleep Study - no CPAP -   Fasting Blood Sugar - NA Checks Blood Sugar _____ times a day  Blood Thinner Instructions:no Aspirin Instructions: Last Dose:  Anesthesia review: no  Patient denies shortness of breath, fever, cough and chest pain at PAT appointment  Pt tries not to climb stairs but reports no SOB with any activities. Patient verbalized understanding of instructions that were given to them at the PAT appointment. Patient was also instructed that they will need to review over the PAT instructions again at home before surgery. yes

## 2021-04-26 ENCOUNTER — Other Ambulatory Visit: Payer: Self-pay | Admitting: Specialist

## 2021-04-26 LAB — SARS CORONAVIRUS 2 (TAT 6-24 HRS): SARS Coronavirus 2: NEGATIVE

## 2021-04-28 ENCOUNTER — Encounter (HOSPITAL_COMMUNITY)
Admission: RE | Disposition: A | Discharge status: Home / Self Care | Payer: Self-pay | Source: Ambulatory Visit | Attending: Orthopedic Surgery

## 2021-04-28 ENCOUNTER — Ambulatory Visit (HOSPITAL_COMMUNITY): Payer: Medicare Other | Admitting: Anesthesiology

## 2021-04-28 ENCOUNTER — Encounter (HOSPITAL_COMMUNITY): Payer: Self-pay | Admitting: Specialist

## 2021-04-28 ENCOUNTER — Other Ambulatory Visit: Payer: Self-pay

## 2021-04-28 ENCOUNTER — Ambulatory Visit (HOSPITAL_COMMUNITY): Payer: Medicare Other

## 2021-04-28 ENCOUNTER — Observation Stay (HOSPITAL_COMMUNITY)
Admission: RE | Admit: 2021-04-28 | Discharge: 2021-05-02 | Disposition: A | Discharge status: Home / Self Care | Payer: Medicare Other | Source: Ambulatory Visit | Attending: Orthopedic Surgery | Admitting: Orthopedic Surgery

## 2021-04-28 DIAGNOSIS — E785 Hyperlipidemia, unspecified: Secondary | ICD-10-CM | POA: Insufficient documentation

## 2021-04-28 DIAGNOSIS — K219 Gastro-esophageal reflux disease without esophagitis: Secondary | ICD-10-CM | POA: Diagnosis not present

## 2021-04-28 DIAGNOSIS — Z79899 Other long term (current) drug therapy: Secondary | ICD-10-CM | POA: Diagnosis not present

## 2021-04-28 DIAGNOSIS — Z96652 Presence of left artificial knee joint: Secondary | ICD-10-CM | POA: Diagnosis not present

## 2021-04-28 DIAGNOSIS — G8918 Other acute postprocedural pain: Secondary | ICD-10-CM | POA: Diagnosis not present

## 2021-04-28 DIAGNOSIS — Z96659 Presence of unspecified artificial knee joint: Secondary | ICD-10-CM

## 2021-04-28 DIAGNOSIS — M1712 Unilateral primary osteoarthritis, left knee: Secondary | ICD-10-CM | POA: Diagnosis not present

## 2021-04-28 DIAGNOSIS — M21062 Valgus deformity, not elsewhere classified, left knee: Secondary | ICD-10-CM | POA: Diagnosis not present

## 2021-04-28 HISTORY — PX: TOTAL KNEE ARTHROPLASTY: SHX125

## 2021-04-28 SURGERY — ARTHROPLASTY, KNEE, TOTAL
Anesthesia: Regional | Site: Knee | Laterality: Left

## 2021-04-28 MED ORDER — HYDROMORPHONE HCL 1 MG/ML IJ SOLN
0.5000 mg | INTRAMUSCULAR | Status: DC | PRN
  Administered 2021-04-28 (×2): 0.5 mg via INTRAVENOUS
  Administered 2021-04-29 – 2021-04-30 (×9): 1 mg via INTRAVENOUS
  Filled 2021-04-28 (×13): qty 1

## 2021-04-28 MED ORDER — FENTANYL CITRATE PF 50 MCG/ML IJ SOSY
50.0000 ug | PREFILLED_SYRINGE | INTRAMUSCULAR | Status: DC
  Filled 2021-04-28: qty 2

## 2021-04-28 MED ORDER — POLYETHYLENE GLYCOL 3350 17 G PO PACK: 17.0000 g | PACK | Freq: Every day | ORAL | 0 refills | Status: DC

## 2021-04-28 MED ORDER — TRANEXAMIC ACID-NACL 1000-0.7 MG/100ML-% IV SOLN
1000.0000 mg | INTRAVENOUS | Status: AC
  Administered 2021-04-28: 14:00:00 1000 mg via INTRAVENOUS
  Filled 2021-04-28: qty 100

## 2021-04-28 MED ORDER — METOCLOPRAMIDE HCL 5 MG PO TABS: 5.0000 mg | ORAL_TABLET | Freq: Three times a day (TID) | ORAL | Status: DC | PRN

## 2021-04-28 MED ORDER — PROPOFOL 10 MG/ML IV BOLUS
INTRAVENOUS | Status: AC
  Filled 2021-04-28: qty 20

## 2021-04-28 MED ORDER — ALUM & MAG HYDROXIDE-SIMETH 200-200-20 MG/5ML PO SUSP: 30.0000 mL | ORAL | Status: DC | PRN

## 2021-04-28 MED ORDER — CHLORHEXIDINE GLUCONATE 0.12 % MT SOLN
15.0000 mL | Freq: Once | OROMUCOSAL | Status: AC
  Administered 2021-04-28: 11:00:00 15 mL via OROMUCOSAL

## 2021-04-28 MED ORDER — DIPHENHYDRAMINE HCL 12.5 MG/5ML PO ELIX: 12.5000 mg | ORAL_SOLUTION | ORAL | Status: DC | PRN

## 2021-04-28 MED ORDER — FLUTICASONE PROPIONATE 50 MCG/ACT NA SUSP
1.0000 | Freq: Every day | NASAL | Status: DC
  Administered 2021-04-29 – 2021-05-02 (×4): 1 via NASAL
  Filled 2021-04-28: qty 16

## 2021-04-28 MED ORDER — SODIUM CHLORIDE 0.9 % IR SOLN
Status: DC | PRN
  Administered 2021-04-28 (×2): 1000 mL

## 2021-04-28 MED ORDER — METHOCARBAMOL 500 MG PO TABS: 500.0000 mg | ORAL_TABLET | Freq: Three times a day (TID) | ORAL | 1 refills | Status: DC | PRN

## 2021-04-28 MED ORDER — PHENOL 1.4 % MT LIQD: 1.0000 | OROMUCOSAL | Status: DC | PRN

## 2021-04-28 MED ORDER — MIDAZOLAM HCL 2 MG/2ML IJ SOLN
1.0000 mg | INTRAMUSCULAR | Status: DC
  Administered 2021-04-28: 12:00:00 2 mg via INTRAVENOUS
  Filled 2021-04-28: qty 2

## 2021-04-28 MED ORDER — DOCUSATE SODIUM 100 MG PO CAPS
100.0000 mg | ORAL_CAPSULE | Freq: Two times a day (BID) | ORAL | Status: DC
  Administered 2021-04-28 – 2021-05-02 (×8): 100 mg via ORAL
  Filled 2021-04-28 (×8): qty 1

## 2021-04-28 MED ORDER — MIDAZOLAM HCL 2 MG/2ML IJ SOLN
INTRAMUSCULAR | Status: AC
  Filled 2021-04-28: qty 2

## 2021-04-28 MED ORDER — IRRISEPT - 450ML BOTTLE WITH 0.05% CHG IN STERILE WATER, USP 99.95% OPTIME
TOPICAL | Status: DC | PRN
  Administered 2021-04-28: 14:00:00 450 mL via TOPICAL

## 2021-04-28 MED ORDER — STERILE WATER FOR IRRIGATION IR SOLN
Status: DC | PRN
  Administered 2021-04-28: 2000 mL

## 2021-04-28 MED ORDER — PANTOPRAZOLE SODIUM 20 MG PO TBEC
20.0000 mg | DELAYED_RELEASE_TABLET | Freq: Every day | ORAL | Status: DC
  Administered 2021-04-28 – 2021-05-02 (×5): 20 mg via ORAL
  Filled 2021-04-28 (×5): qty 1

## 2021-04-28 MED ORDER — ONDANSETRON HCL 4 MG/2ML IJ SOLN
INTRAMUSCULAR | Status: DC | PRN
  Administered 2021-04-28: 4 mg via INTRAVENOUS

## 2021-04-28 MED ORDER — PHENYLEPHRINE 40 MCG/ML (10ML) SYRINGE FOR IV PUSH (FOR BLOOD PRESSURE SUPPORT)
PREFILLED_SYRINGE | INTRAVENOUS | Status: AC
  Filled 2021-04-28: qty 10

## 2021-04-28 MED ORDER — MENTHOL 3 MG MT LOZG: 1.0000 | LOZENGE | OROMUCOSAL | Status: DC | PRN

## 2021-04-28 MED ORDER — RISAQUAD PO CAPS
1.0000 | ORAL_CAPSULE | Freq: Every day | ORAL | Status: DC
  Administered 2021-04-28 – 2021-05-02 (×5): 1 via ORAL
  Filled 2021-04-28 (×5): qty 1

## 2021-04-28 MED ORDER — AMISULPRIDE (ANTIEMETIC) 5 MG/2ML IV SOLN: 10.0000 mg | Freq: Once | INTRAVENOUS | Status: DC | PRN

## 2021-04-28 MED ORDER — SODIUM CHLORIDE 0.9% FLUSH
INTRAVENOUS | Status: DC | PRN
  Administered 2021-04-28: 40 mL

## 2021-04-28 MED ORDER — BISACODYL 5 MG PO TBEC: 5.0000 mg | DELAYED_RELEASE_TABLET | Freq: Every day | ORAL | Status: DC | PRN

## 2021-04-28 MED ORDER — PROPOFOL 500 MG/50ML IV EMUL
INTRAVENOUS | Status: DC | PRN
  Administered 2021-04-28: 100 ug/kg/min via INTRAVENOUS

## 2021-04-28 MED ORDER — SODIUM CHLORIDE (PF) 0.9 % IJ SOLN
INTRAMUSCULAR | Status: AC
  Filled 2021-04-28: qty 40

## 2021-04-28 MED ORDER — ASPIRIN EC 81 MG PO TBEC: 81.0000 mg | DELAYED_RELEASE_TABLET | Freq: Two times a day (BID) | ORAL | 1 refills | Status: DC

## 2021-04-28 MED ORDER — DOCUSATE SODIUM 100 MG PO CAPS: 100.0000 mg | ORAL_CAPSULE | Freq: Two times a day (BID) | ORAL | 1 refills | Status: DC | PRN

## 2021-04-28 MED ORDER — LACTATED RINGERS IV SOLN: INTRAVENOUS | Status: DC

## 2021-04-28 MED ORDER — KCL IN DEXTROSE-NACL 20-5-0.45 MEQ/L-%-% IV SOLN
INTRAVENOUS | Status: AC
  Filled 2021-04-28 (×2): qty 1000

## 2021-04-28 MED ORDER — 0.9 % SODIUM CHLORIDE (POUR BTL) OPTIME
TOPICAL | Status: DC | PRN
  Administered 2021-04-28: 14:00:00 1000 mL

## 2021-04-28 MED ORDER — CEFAZOLIN SODIUM-DEXTROSE 2-4 GM/100ML-% IV SOLN
2.0000 g | Freq: Four times a day (QID) | INTRAVENOUS | Status: AC
  Administered 2021-04-28 – 2021-04-29 (×2): 2 g via INTRAVENOUS
  Filled 2021-04-28 (×2): qty 100

## 2021-04-28 MED ORDER — SODIUM CHLORIDE (PF) 0.9 % IJ SOLN
INTRAMUSCULAR | Status: AC
  Filled 2021-04-28: qty 10

## 2021-04-28 MED ORDER — ONDANSETRON HCL 4 MG PO TABS
4.0000 mg | ORAL_TABLET | Freq: Four times a day (QID) | ORAL | Status: DC | PRN
  Administered 2021-05-01 (×2): 4 mg via ORAL
  Filled 2021-04-28 (×4): qty 1

## 2021-04-28 MED ORDER — ASPIRIN 81 MG PO CHEW
81.0000 mg | CHEWABLE_TABLET | Freq: Two times a day (BID) | ORAL | Status: DC
  Administered 2021-04-29 – 2021-05-02 (×7): 81 mg via ORAL
  Filled 2021-04-28 (×7): qty 1

## 2021-04-28 MED ORDER — ACETAMINOPHEN 500 MG PO TABS
1000.0000 mg | ORAL_TABLET | Freq: Four times a day (QID) | ORAL | Status: AC
  Administered 2021-04-28 – 2021-04-29 (×3): 1000 mg via ORAL
  Filled 2021-04-28 (×3): qty 2

## 2021-04-28 MED ORDER — BUPIVACAINE-EPINEPHRINE (PF) 0.25% -1:200000 IJ SOLN
INTRAMUSCULAR | Status: AC
  Filled 2021-04-28: qty 30

## 2021-04-28 MED ORDER — ONDANSETRON HCL 4 MG/2ML IJ SOLN
4.0000 mg | Freq: Four times a day (QID) | INTRAMUSCULAR | Status: DC | PRN
  Administered 2021-04-29 (×2): 4 mg via INTRAVENOUS
  Filled 2021-04-28 (×2): qty 2

## 2021-04-28 MED ORDER — MIDAZOLAM HCL 2 MG/2ML IJ SOLN
0.5000 mg | INTRAMUSCULAR | Status: DC
Start: 2021-04-28 — End: 2021-04-28

## 2021-04-28 MED ORDER — MIDAZOLAM HCL 2 MG/2ML IJ SOLN
INTRAMUSCULAR | Status: DC | PRN
  Administered 2021-04-28 (×2): 1 mg via INTRAVENOUS

## 2021-04-28 MED ORDER — ONDANSETRON HCL 4 MG/2ML IJ SOLN: 4.0000 mg | Freq: Once | INTRAMUSCULAR | Status: DC | PRN

## 2021-04-28 MED ORDER — LIDOCAINE 2% (20 MG/ML) 5 ML SYRINGE
INTRAMUSCULAR | Status: DC | PRN
  Administered 2021-04-28: 40 mg via INTRAVENOUS

## 2021-04-28 MED ORDER — METOCLOPRAMIDE HCL 5 MG/ML IJ SOLN
5.0000 mg | Freq: Three times a day (TID) | INTRAMUSCULAR | Status: DC | PRN
  Administered 2021-04-29 – 2021-04-30 (×2): 10 mg via INTRAVENOUS
  Filled 2021-04-28 (×2): qty 2

## 2021-04-28 MED ORDER — VITAMIN D 25 MCG (1000 UNIT) PO TABS
1000.0000 [IU] | ORAL_TABLET | Freq: Every day | ORAL | Status: DC
  Administered 2021-04-28 – 2021-05-02 (×5): 1000 [IU] via ORAL
  Filled 2021-04-28 (×5): qty 1

## 2021-04-28 MED ORDER — FENTANYL CITRATE (PF) 250 MCG/5ML IJ SOLN
INTRAMUSCULAR | Status: DC | PRN
  Administered 2021-04-28 (×3): 50 ug via INTRAVENOUS

## 2021-04-28 MED ORDER — CEFAZOLIN SODIUM-DEXTROSE 2-4 GM/100ML-% IV SOLN
2.0000 g | INTRAVENOUS | Status: AC
  Administered 2021-04-28: 14:00:00 2 g via INTRAVENOUS
  Filled 2021-04-28: qty 100

## 2021-04-28 MED ORDER — SODIUM CHLORIDE 0.9 % IV SOLN
INTRAVENOUS | Status: DC | PRN
  Administered 2021-04-28: 16:00:00 70 mL

## 2021-04-28 MED ORDER — BUPIVACAINE IN DEXTROSE 0.75-8.25 % IT SOLN
INTRATHECAL | Status: DC | PRN
  Administered 2021-04-28: 1.6 mL via INTRATHECAL

## 2021-04-28 MED ORDER — BUPIVACAINE LIPOSOME 1.3 % IJ SUSP
INTRAMUSCULAR | Status: DC | PRN
  Administered 2021-04-28: 20 mL

## 2021-04-28 MED ORDER — FENTANYL CITRATE PF 50 MCG/ML IJ SOSY: 25.0000 ug | PREFILLED_SYRINGE | INTRAMUSCULAR | Status: DC | PRN

## 2021-04-28 MED ORDER — PHENYLEPHRINE HCL (PRESSORS) 10 MG/ML IV SOLN
INTRAVENOUS | Status: AC
  Filled 2021-04-28: qty 1

## 2021-04-28 MED ORDER — POLYETHYLENE GLYCOL 3350 17 G PO PACK
17.0000 g | PACK | Freq: Every day | ORAL | Status: DC | PRN
  Administered 2021-05-01: 09:00:00 17 g via ORAL
  Filled 2021-04-28: qty 1

## 2021-04-28 MED ORDER — DEXAMETHASONE SODIUM PHOSPHATE 10 MG/ML IJ SOLN
INTRAMUSCULAR | Status: DC | PRN
  Administered 2021-04-28: 5 mg via INTRAVENOUS

## 2021-04-28 MED ORDER — FENTANYL CITRATE (PF) 250 MCG/5ML IJ SOLN
INTRAMUSCULAR | Status: AC
  Filled 2021-04-28: qty 5

## 2021-04-28 MED ORDER — METHOCARBAMOL 500 MG IVPB - SIMPLE MED
500.0000 mg | Freq: Four times a day (QID) | INTRAVENOUS | Status: DC | PRN
  Filled 2021-04-28: qty 50

## 2021-04-28 MED ORDER — BUPIVACAINE-EPINEPHRINE (PF) 0.5% -1:200000 IJ SOLN
INTRAMUSCULAR | Status: DC | PRN
  Administered 2021-04-28: 30 mL via PERINEURAL

## 2021-04-28 MED ORDER — ACETAMINOPHEN 10 MG/ML IV SOLN
1000.0000 mg | INTRAVENOUS | Status: AC
  Administered 2021-04-28: 14:00:00 1000 mg via INTRAVENOUS
  Filled 2021-04-28: qty 100

## 2021-04-28 MED ORDER — PHENYLEPHRINE HCL-NACL 20-0.9 MG/250ML-% IV SOLN
INTRAVENOUS | Status: DC | PRN
  Administered 2021-04-28: 25 ug/min via INTRAVENOUS

## 2021-04-28 MED ORDER — OXYCODONE HCL 5 MG PO TABS
5.0000 mg | ORAL_TABLET | ORAL | Status: DC | PRN
  Administered 2021-04-28 – 2021-05-01 (×4): 5 mg via ORAL
  Filled 2021-04-28 (×4): qty 1

## 2021-04-28 MED ORDER — BUPIVACAINE-EPINEPHRINE 0.25% -1:200000 IJ SOLN
INTRAMUSCULAR | Status: DC | PRN
  Administered 2021-04-28: 30 mL

## 2021-04-28 MED ORDER — ACETAMINOPHEN 325 MG PO TABS
325.0000 mg | ORAL_TABLET | Freq: Four times a day (QID) | ORAL | Status: DC | PRN
  Administered 2021-04-30 – 2021-05-02 (×3): 650 mg via ORAL
  Filled 2021-04-28 (×3): qty 2

## 2021-04-28 MED ORDER — METHOCARBAMOL 500 MG PO TABS
500.0000 mg | ORAL_TABLET | Freq: Four times a day (QID) | ORAL | Status: DC | PRN
  Administered 2021-04-28 – 2021-05-02 (×8): 500 mg via ORAL
  Filled 2021-04-28 (×8): qty 1

## 2021-04-28 MED ORDER — PROPOFOL 10 MG/ML IV BOLUS
INTRAVENOUS | Status: DC | PRN
  Administered 2021-04-28 (×6): 20 mg via INTRAVENOUS
  Administered 2021-04-28: 10 mg via INTRAVENOUS

## 2021-04-28 MED ORDER — OXYCODONE HCL 5 MG PO TABS
10.0000 mg | ORAL_TABLET | ORAL | Status: DC | PRN
  Administered 2021-04-28 – 2021-05-02 (×12): 10 mg via ORAL
  Filled 2021-04-28 (×13): qty 2

## 2021-04-28 MED ORDER — OXYCODONE HCL 5 MG PO TABS: 5.0000 mg | ORAL_TABLET | ORAL | 0 refills | Status: DC | PRN

## 2021-04-28 MED ORDER — ORAL CARE MOUTH RINSE: 15.0000 mL | Freq: Once | OROMUCOSAL | Status: AC

## 2021-04-28 SURGICAL SUPPLY — 83 items
AGENT HMST SPONGE THK3/8 (HEMOSTASIS)
ATTUNE PS FEM LT SZ 6 CEM KNEE (Femur) ×2 IMPLANT
ATTUNE PSRP INSR SZ6 5 KNEE (Insert) ×1 IMPLANT
ATTUNE PSRP INSR SZ6 5MM KNEE (Insert) ×1 IMPLANT
BAG COUNTER SPONGE SURGICOUNT (BAG) IMPLANT
BAG DECANTER FOR FLEXI CONT (MISCELLANEOUS) ×4 IMPLANT
BAG SPEC THK2 15X12 ZIP CLS (MISCELLANEOUS)
BAG SPNG CNTER NS LX DISP (BAG)
BAG SURGICOUNT SPONGE COUNTING (BAG)
BAG ZIPLOCK 12X15 (MISCELLANEOUS) IMPLANT
BASE TIBIA ATTUNE KNEE SYS SZ6 (Knees) IMPLANT
BLADE SAW SGTL 11.0X1.19X90.0M (BLADE) ×4 IMPLANT
BLADE SAW SGTL 13.0X1.19X90.0M (BLADE) ×4 IMPLANT
BLADE SURG SZ10 CARB STEEL (BLADE) ×8 IMPLANT
BNDG COHESIVE 4X5 TAN ST LF (GAUZE/BANDAGES/DRESSINGS) ×4 IMPLANT
BNDG ELASTIC 4X5.8 VLCR STR LF (GAUZE/BANDAGES/DRESSINGS) ×4 IMPLANT
BNDG ELASTIC 6X5.8 VLCR STR LF (GAUZE/BANDAGES/DRESSINGS) ×4 IMPLANT
BOWL SMART MIX CTS (DISPOSABLE) ×4 IMPLANT
BSPLAT TIB 6 CMNT ROT PLAT STR (Knees) ×1 IMPLANT
CEMENT HV SMART SET (Cement) ×8 IMPLANT
CLOSURE WOUND 1/2 X4 (GAUZE/BANDAGES/DRESSINGS) ×1
COVER SURGICAL LIGHT HANDLE (MISCELLANEOUS) ×4 IMPLANT
CUFF TOURN SGL QUICK 34 (TOURNIQUET CUFF) ×3
CUFF TRNQT CYL 34X4.125X (TOURNIQUET CUFF) ×2 IMPLANT
DECANTER SPIKE VIAL GLASS SM (MISCELLANEOUS) ×4 IMPLANT
DRAPE INCISE IOBAN 66X45 STRL (DRAPES) ×4 IMPLANT
DRAPE ORTHO SPLIT 77X108 STRL (DRAPES) ×6
DRAPE SHEET LG 3/4 BI-LAMINATE (DRAPES) ×8 IMPLANT
DRAPE SURG ORHT 6 SPLT 77X108 (DRAPES) ×4 IMPLANT
DRAPE U-SHAPE 47X51 STRL (DRAPES) ×4 IMPLANT
DRSG AQUACEL AG ADV 3.5X10 (GAUZE/BANDAGES/DRESSINGS) ×4 IMPLANT
DRSG TEGADERM 4X4.75 (GAUZE/BANDAGES/DRESSINGS) IMPLANT
DURAPREP 26ML APPLICATOR (WOUND CARE) ×4 IMPLANT
ELECT BLADE TIP CTD 4 INCH (ELECTRODE) ×4 IMPLANT
ELECT REM PT RETURN 15FT ADLT (MISCELLANEOUS) ×4 IMPLANT
EVACUATOR 1/8 PVC DRAIN (DRAIN) IMPLANT
GAUZE SPONGE 2X2 8PLY STRL LF (GAUZE/BANDAGES/DRESSINGS) IMPLANT
GLOVE SRG 8 PF TXTR STRL LF DI (GLOVE) ×2 IMPLANT
GLOVE SURG POLYISO LF SZ7.5 (GLOVE) ×8 IMPLANT
GLOVE SURG POLYISO LF SZ8 (GLOVE) ×8 IMPLANT
GLOVE SURG UNDER POLY LF SZ7.5 (GLOVE) ×4 IMPLANT
GLOVE SURG UNDER POLY LF SZ8 (GLOVE) ×3
GOWN STRL REUS W/TWL XL LVL3 (GOWN DISPOSABLE) ×8 IMPLANT
HANDPIECE INTERPULSE COAX TIP (DISPOSABLE) ×3
HEMOSTAT SPONGE AVITENE ULTRA (HEMOSTASIS) IMPLANT
HOLDER FOLEY CATH W/STRAP (MISCELLANEOUS) ×2 IMPLANT
IMMOBILIZER KNEE 20 (SOFTGOODS) ×3 IMPLANT
IMMOBILIZER KNEE 20 THIGH 36 (SOFTGOODS) ×2 IMPLANT
JET LAVAGE IRRISEPT WOUND (IRRIGATION / IRRIGATOR) ×3
KIT TURNOVER KIT A (KITS) ×2 IMPLANT
LAVAGE JET IRRISEPT WOUND (IRRIGATION / IRRIGATOR) ×2 IMPLANT
MANIFOLD NEPTUNE II (INSTRUMENTS) ×4 IMPLANT
NDL SAFETY ECLIPSE 18X1.5 (NEEDLE) IMPLANT
NEEDLE HYPO 18GX1.5 SHARP (NEEDLE)
NS IRRIG 1000ML POUR BTL (IV SOLUTION) ×2 IMPLANT
PACK TOTAL KNEE CUSTOM (KITS) ×4 IMPLANT
PATELLA MEDIAL ATTUN 35MM KNEE (Knees) ×2 IMPLANT
PROTECTOR NERVE ULNAR (MISCELLANEOUS) ×4 IMPLANT
SAW OSC TIP CART 19.5X105X1.3 (SAW) ×4 IMPLANT
SEALER BIPOLAR AQUA 6.0 (INSTRUMENTS) ×4 IMPLANT
SET HNDPC FAN SPRY TIP SCT (DISPOSABLE) ×2 IMPLANT
SPONGE GAUZE 2X2 STER 10/PKG (GAUZE/BANDAGES/DRESSINGS)
SPONGE SURGIFOAM ABS GEL 100 (HEMOSTASIS) IMPLANT
STAPLER VISISTAT (STAPLE) IMPLANT
STRIP CLOSURE SKIN 1/2X4 (GAUZE/BANDAGES/DRESSINGS) ×1 IMPLANT
SUT BONE WAX W31G (SUTURE) ×4 IMPLANT
SUT MNCRL AB 4-0 PS2 18 (SUTURE) ×2 IMPLANT
SUT STRATAFIX 0 PDS 27 VIOLET (SUTURE) ×3
SUT VIC AB 1 CT1 27 (SUTURE) ×9
SUT VIC AB 1 CT1 27XBRD ANTBC (SUTURE) ×4 IMPLANT
SUT VIC AB 1 CTX 36 (SUTURE)
SUT VIC AB 1 CTX36XBRD ANBCTR (SUTURE) IMPLANT
SUT VIC AB 2-0 CT1 27 (SUTURE) ×9
SUT VIC AB 2-0 CT1 TAPERPNT 27 (SUTURE) ×6 IMPLANT
SUTURE STRATFX 0 PDS 27 VIOLET (SUTURE) ×2 IMPLANT
SYR 3ML LL SCALE MARK (SYRINGE) IMPLANT
SYR 50ML LL SCALE MARK (SYRINGE) IMPLANT
TIBIA ATTUNE KNEE SYS BASE SZ6 (Knees) ×3 IMPLANT
TRAY FOLEY MTR SLVR 16FR STAT (SET/KITS/TRAYS/PACK) ×4 IMPLANT
TUBE SUCTION HIGH CAP CLEAR NV (SUCTIONS) ×2 IMPLANT
WATER STERILE IRR 1000ML POUR (IV SOLUTION) ×4 IMPLANT
WIPE CHG CHLORHEXIDINE 2% (PERSONAL CARE ITEMS) ×4 IMPLANT
WRAP KNEE MAXI GEL POST OP (GAUZE/BANDAGES/DRESSINGS) ×4 IMPLANT

## 2021-04-28 NOTE — Anesthesia Procedure Notes (Signed)
Date/Time: 04/28/2021 1:29 PM Performed by: Cynda Familia, CRNA Pre-anesthesia Checklist: Patient identified, Emergency Drugs available, Suction available, Patient being monitored and Timeout performed Oxygen Delivery Method: Simple face mask Placement Confirmation: positive ETCO2 and breath sounds checked- equal and bilateral Dental Injury: Teeth and Oropharynx as per pre-operative assessment

## 2021-04-28 NOTE — Progress Notes (Signed)
Assisted Dr. Ellender with left, ultrasound guided, adductor canal block. Side rails up, monitors on throughout procedure. See vital signs in flow sheet. Tolerated Procedure well.  

## 2021-04-28 NOTE — Anesthesia Procedure Notes (Signed)
Anesthesia Regional Block: Adductor canal block   Pre-Anesthetic Checklist: , timeout performed,  Correct Patient, Correct Site, Correct Laterality,  Correct Procedure,, site marked,  Risks and benefits discussed,  Surgical consent,  Pre-op evaluation,  At surgeon's request and post-op pain management  Laterality: Left  Prep: chloraprep       Needles:  Injection technique: Single-shot  Needle Type: Echogenic Stimulator Needle     Needle Length: 9cm  Needle Gauge: 21     Additional Needles:   Procedures:,,,, ultrasound used (permanent image in chart),,    Narrative:  Start time: 04/28/2021 12:20 PM End time: 04/28/2021 12:30 PM Injection made incrementally with aspirations every 5 mL.  Performed by: Personally  Anesthesiologist: Murvin Natal, MD  Additional Notes: Functioning IV was confirmed and monitors were applied. A time-out was performed. Hand hygiene and sterile gloves were used. The thigh was placed in a frog-leg position and prepped in a sterile fashion. A 75mm 21ga Arrow echogenic stimulator needle was placed using ultrasound guidance.  Negative aspiration and negative test dose prior to incremental administration of local anesthetic. The patient tolerated the procedure well.

## 2021-04-28 NOTE — Anesthesia Postprocedure Evaluation (Signed)
Anesthesia Post Note  Patient: Julia Murray  Procedure(s) Performed: TOTAL KNEE ARTHROPLASTY, REMOVAL OF HARDWARE (ACL SCREWS) (Left: Knee)     Patient location during evaluation: PACU Anesthesia Type: Regional Level of consciousness: awake and alert Pain management: pain level controlled Vital Signs Assessment: post-procedure vital signs reviewed and stable Respiratory status: spontaneous breathing and respiratory function stable Cardiovascular status: blood pressure returned to baseline and stable Postop Assessment: spinal receding Anesthetic complications: no   No notable events documented.  Last Vitals:  Vitals:   04/28/21 1645 04/28/21 1700  BP: 140/76 136/76  Pulse: (!) 49 60  Resp: 11 14  Temp:    SpO2: 100% 100%    Last Pain:  Vitals:   04/28/21 1700  TempSrc:   PainSc: 2                  Shanyla Marconi DANIEL

## 2021-04-28 NOTE — Brief Op Note (Signed)
04/28/2021  3:54 PM  PATIENT:  Julia Murray  65 y.o. female  PRE-OPERATIVE DIAGNOSIS:  Left knee degenerative joint disease and retained hardware  POST-OPERATIVE DIAGNOSIS:  Left knee degenerative joint disease and retained hardware  PROCEDURE:  Procedure(s): TOTAL KNEE ARTHROPLASTY, REMOVAL OF HARDWARE (ACL SCREWS) (Left)  SURGEON:  Surgeon(s) and Role:    Susa Day, MD - Primary  PHYSICIAN ASSISTANT:   ASSISTANTS: Bissell   ANESTHESIA:   spinal  EBL:  100 mL   BLOOD ADMINISTERED:none  DRAINS: none   LOCAL MEDICATIONS USED:  MARCAINE     SPECIMEN:  No Specimen  DISPOSITION OF SPECIMEN:  N/A  COUNTS:  YES  TOURNIQUET:   Total Tourniquet Time Documented: Thigh (Left) - 71 minutes Total: Thigh (Left) - 71 minutes   DICTATION: .Other Dictation: Dictation Number [id number]  PLAN OF CARE: Admit for overnight observation  PATIENT DISPOSITION:  PACU - hemodynamically stable.   Delay start of Pharmacological VTE agent (>24hrs) due to surgical blood loss or risk of bleeding: no

## 2021-04-28 NOTE — Op Note (Signed)
NAME: Julia Murray, Julia Murray MEDICAL RECORD NO: [id number] ACCOUNT NO: [id number] DATE OF BIRTH: 08/13/1955 FACILITY: [facility] LOCATION: [id number] PHYSICIAN: Johnn Hai, MD  Operative Report   DATE OF PROCEDURE: 04/28/2021  PREOPERATIVE DIAGNOSIS:  End-stage osteoarthrosis, left knee.  Retained hardware, left tibia.  POSTOPERATIVE DIAGNOSIS:  End-stage osteoarthrosis, left knee.  Retained hardware, left tibia.  PROCEDURE PERFORMED:  Left total knee arthroplasty utilizing DePuy Attune rotating platform 6 femur, 6 tibia, 5 mm insert, 35 patella.  Removal of hardware, left tibia and bone grafting of the left tibia.  ANESTHESIA:  Spinal.  ASSISTANT:  Lacie Draft, PA-C  HISTORY:  A 65 year old end-stage osteoarthrosis medial compartment valgus deformity, previous ACL reconstruction.  DESCRIPTION OF THE PROCEDURE:  After induction of adequate spinal anesthesia, 2 grams Kefzol, the left lower extremity was prepped and draped in the usual sterile fashion.  Exsanguinated thigh, tourniquet inflated to 250 mmHg.  Midline incision was then  made over the skin.  Full thickness flaps developed.  Median parapatellar arthrotomy was performed.  Patella everted, knee flexed.  Tricompartmental osteoarthrosis was noted.  Bone-on-bone medial and lateral compartment.  Leksell rongeur utilized to  remove osteophytes.  The intercondylar notch was overgrown with bone.  This was removed with an osteotome.  Remnants of medial and lateral menisci were removed.  Debrided the fat pad.  I entered the femoral canal with a step drill without difficulty.   This was irrigated, placed T-handle then a 5 degree left intramedullary guide with 9 off the distal femur.  This was then pinned.  Distal femoral cut was then performed.  We then sized off the anterior cortex to be a 6.  This was pinned in external  rotation.  The patient had some slight internal rotation of the femur.  After this was pinned, I then performed the anterior and  posterior chamfer cuts.  Soft tissues protected posteriorly at all times.  No notching of the cortex.  Following this, I  subluxed the tibia.  Removed osteophytes with a rongeur.  Utilizing the external alignment guide parallel to the shaft 3-degree slope bisecting the tibiotalar joint, 2 off the defect, which was anteromedially.  This was approximately 9 off the high side,  which was laterally.  This was then pinned.  I performed the proximal tibial cut protecting the soft tissues posteriorly at all times.  I then checked the extension gap and it was satisfactory with a #5 insert in full extension.  A good flexion gap as  well.  Next, I flexed the knee translated the tibia.  I then measured the tibia to a 6 just to the medial aspect of the tibial tubercle maximizing her coverage.  I harvested bone.  I then pinned the baseplate.  I harvested bone centrally and impacted  into the femur.  I then drilled centrally and we were able to see the ACL screw on the anterior cortex.  This was then fixed with a Leksell and rotated medially to remove the screw.  This was a [place] screw.  Following this, some bone affixed to the  screw.  I then used our punch guide and impacted those.  Following that, I turned our attention back to the femur, used a box cutting jig bisecting the femur.  Box cut was then performed.  I placed a trial 6 femur and it fit flush.  I drilled the lug  holes.  I placed a 5 mm insert, reduced the knee.  I had full extension and full flexion, and  good stability to varus valgus stress at 0 and 30 degrees.  Negative anterior drawer.  We had some bone graft from the cuts and bone grafted the canal that was  used for the ACL graft, both externally and internally.  This is well grafted.  I then turned attention to the patella as it was very long axis and somewhat thin.  This was measured best at a 35, but due to the slight internal rotation of the femur, I  medialized the patellar component.  I used a  trial paddle parallel to the joint surface.  I then drilled our peg holes, placed the trial patella and reduced it and had excellent patellofemoral tracking.  This was then removed.  Knee flexed.  All  components were removed using pulsatile lavage, lavaged the joint, checked posteriorly cauterized the geniculates.  Capsule intact.  I then used pulsatile lavage to clean the joint.  We then flexed the knee, subluxed the tibia, dried surfaces thoroughly.   Cement was mixed on the back table under vacuum suction.  It was then placed in the tibia digitally pressurizing it.  We held outside of the bone graft, compressed while the cement was impacted.  We used the permanent tibial insert 6, cemented on the  tibial insert and impacted it into the tibia.  Redundant cement removed, cemented and impacted the femur with cement on both sides.  Redundant cement removed.  I placed a 5 insert, reduced and held axial load throughout the curing of the cement.  I  cemented and clamped the patella.  Marcaine with epinephrine was infiltrated in the joint.  Redundant cement removed.  Following adequate curing of the cement, the tourniquet was deflated at 70 minutes.  Any bleeding was cauterized.  Good flexion and  good extension and good stability to varus valgus stressing at 0 and 30 degrees, negative anterior drawer.  I then flexed the knee, removed the 5 insert. Meticulously removed all redundant cement.  Copiously irrigated with pulsatile lavage and IrriSept  and then with pulsatile lavage again.  I then subluxed the tibia and placed 5 mm permanent insert, reduced it.  Had full flexion, good extension, good stability to varus valgus stressing at 0 and 30 degrees.  Negative anterior drawer.  Then, slight  flexion reapproximated patellar arthrotomy with #1 Vicryl in interrupted figure-of-eight sutures.  This was oversewn with a running Stratafix.  Following this, she had flexion to gravity 90 degrees.  An excellent  patellofemoral tracking.  Negative  anterior drawer.  Copiously irrigated the tissue once again.  Closed the subcutaneous with 2-0 and skin with subcuticular Monocryl.  Sterile dressing applied, placed in immobilizer and transported to the recovery room in satisfactory condition.  The patient tolerated the procedure well.  No complications.  Assistant, Lacie Draft, PA was used throughout the case for patient positioning, holding of the extremity into the components and closure.  ESTIMATED BLOOD LOSS:  100 mL.   PUS D: 04/28/2021 4:04:41 pm T: 04/28/2021 6:52:00 pm  JOB: [id number]/ [id number]

## 2021-04-28 NOTE — Transfer of Care (Signed)
Immediate Anesthesia Transfer of Care Note  Patient: Julia Murray  Procedure(s) Performed: TOTAL KNEE ARTHROPLASTY, REMOVAL OF HARDWARE (ACL SCREWS) (Left: Knee)  Patient Location: PACU  Anesthesia Type:Spinal  Level of Consciousness: awake and alert   Airway & Oxygen Therapy: Patient Spontanous Breathing and Patient connected to face mask oxygen  Post-op Assessment: Report given to RN and Post -op Vital signs reviewed and stable  Post vital signs: Reviewed and stable  Last Vitals:  Vitals Value Taken Time  BP 130/80 04/28/21 1621  Temp    Pulse 72 04/28/21 1623  Resp 15 04/28/21 1623  SpO2 88 % 04/28/21 1623  Vitals shown include unvalidated device data.  Last Pain:  Vitals:   04/28/21 1051  TempSrc:   PainSc: 0-No pain         Complications: No notable events documented.

## 2021-04-28 NOTE — Discharge Instructions (Signed)
Elevate leg above heart 6x a day for 60minutes each Use knee immobilizer while walking until can SLR x 10 Use knee immobilizer in bed to keep knee in extension Aquacel dressing may remain in place until follow up. May shower with aquacel dressing in place. If the dressing becomes saturated or peels off, you may remove aquacel dressing. Do not remove steri-strips if they are present. Place new dressing with gauze and tape or ACE bandage which should be kept clean and dry and changed daily.   INSTRUCTIONS AFTER JOINT REPLACEMENT   Remove items at home which could result in a fall. This includes throw rugs or furniture in walking pathways ICE to the affected joint every three hours while awake for 30 minutes at a time, for at least the first 3-5 days, and then as needed for pain and swelling.  Continue to use ice for pain and swelling. You may notice swelling that will progress down to the foot and ankle.  This is normal after surgery.  Elevate your leg when you are not up walking on it.   Continue to use the breathing machine you got in the hospital (incentive spirometer) which will help keep your temperature down.  It is common for your temperature to cycle up and down following surgery, especially at night when you are not up moving around and exerting yourself.  The breathing machine keeps your lungs expanded and your temperature down.   DIET:  As you were doing prior to hospitalization, we recommend a well-balanced diet.  DRESSING / WOUND CARE / SHOWERING  Keep the surgical dressing until follow up.  The dressing is water proof, so you can shower without any extra covering.  IF THE DRESSING FALLS OFF or the wound gets wet inside, change the dressing with sterile gauze.  Please use good hand washing techniques before changing the dressing.  Do not use any lotions or creams on the incision until instructed by your surgeon.    ACTIVITY  Increase activity slowly as tolerated, but follow the weight  bearing instructions below.   No driving for 6 weeks or until further direction given by your physician.  You cannot drive while taking narcotics.  No lifting or carrying greater than 10 lbs. until further directed by your surgeon. Avoid periods of inactivity such as sitting longer than an hour when not asleep. This helps prevent blood clots.  You may return to work once you are authorized by your doctor.     WEIGHT BEARING   Weight bearing as tolerated with assist device (walker, cane, etc) as directed, use it as long as suggested by your surgeon or therapist, typically at least 4-6 weeks.   EXERCISES  Results after joint replacement surgery are often greatly improved when you follow the exercise, range of motion and muscle strengthening exercises prescribed by your doctor. Safety measures are also important to protect the joint from further injury. Any time any of these exercises cause you to have increased pain or swelling, decrease what you are doing until you are comfortable again and then slowly increase them. If you have problems or questions, call your caregiver or physical therapist for advice.   Rehabilitation is important following a joint replacement. After just a few days of immobilization, the muscles of the leg can become weakened and shrink (atrophy).  These exercises are designed to build up the tone and strength of the thigh and leg muscles and to improve motion. Often times heat used for twenty to thirty  minutes before working out will loosen up your tissues and help with improving the range of motion but do not use heat for the first two weeks following surgery (sometimes heat can increase post-operative swelling).   These exercises can be done on a training (exercise) mat, on the floor, on a table or on a bed. Use whatever works the best and is most comfortable for you.    Use music or television while you are exercising so that the exercises are a pleasant break in your day.  This will make your life better with the exercises acting as a break in your routine that you can look forward to.   Perform all exercises about fifteen times, three times per day or as directed.  You should exercise both the operative leg and the other leg as well.  Exercises include:   Quad Sets - Tighten up the muscle on the front of the thigh (Quad) and hold for 5-10 seconds.   Straight Leg Raises - With your knee straight (if you were given a brace, keep it on), lift the leg to 60 degrees, hold for 3 seconds, and slowly lower the leg.  Perform this exercise against resistance later as your leg gets stronger.  Leg Slides: Lying on your back, slowly slide your foot toward your buttocks, bending your knee up off the floor (only go as far as is comfortable). Then slowly slide your foot back down until your leg is flat on the floor again.  Angel Wings: Lying on your back spread your legs to the side as far apart as you can without causing discomfort.  Hamstring Strength:  Lying on your back, push your heel against the floor with your leg straight by tightening up the muscles of your buttocks.  Repeat, but this time bend your knee to a comfortable angle, and push your heel against the floor.  You may put a pillow under the heel to make it more comfortable if necessary.   A rehabilitation program following joint replacement surgery can speed recovery and prevent re-injury in the future due to weakened muscles. Contact your doctor or a physical therapist for more information on knee rehabilitation.    CONSTIPATION  Constipation is defined medically as fewer than three stools per week and severe constipation as less than one stool per week.  Even if you have a regular bowel pattern at home, your normal regimen is likely to be disrupted due to multiple reasons following surgery.  Combination of anesthesia, postoperative narcotics, change in appetite and fluid intake all can affect your bowels.   YOU MUST  use at least one of the following options; they are listed in order of increasing strength to get the job done.  They are all available over the counter, and you may need to use some, POSSIBLY even all of these options:    Drink plenty of fluids (prune juice may be helpful) and high fiber foods Colace 100 mg by mouth twice a day  Senokot for constipation as directed and as needed Dulcolax (bisacodyl), take with full glass of water  Miralax (polyethylene glycol) once or twice a day as needed.  If you have tried all these things and are unable to have a bowel movement in the first 3-4 days after surgery call either your surgeon or your primary doctor.    If you experience loose stools or diarrhea, hold the medications until you stool forms back up.  If your symptoms do not get better within  1 week or if they get worse, check with your doctor.  If you experience "the worst abdominal pain ever" or develop nausea or vomiting, please contact the office immediately for further recommendations for treatment.   ITCHING:  If you experience itching with your medications, try taking only a single pain pill, or even half a pain pill at a time.  You can also use Benadryl over the counter for itching or also to help with sleep.   TED HOSE STOCKINGS:  Use stockings on both legs until for at least 2 weeks or as directed by physician office. They may be removed at night for sleeping.  MEDICATIONS:  See your medication summary on the After Visit Summary that nursing will review with you.  You may have some home medications which will be placed on hold until you complete the course of blood thinner medication.  It is important for you to complete the blood thinner medication as prescribed.  PRECAUTIONS:  If you experience chest pain or shortness of breath - call 911 immediately for transfer to the hospital emergency department.   If you develop a fever greater that 101 F, purulent drainage from wound, increased  redness or drainage from wound, foul odor from the wound/dressing, or calf pain - CONTACT YOUR SURGEON.                                                   FOLLOW-UP APPOINTMENTS:  If you do not already have a post-op appointment, please call the office for an appointment to be seen by your surgeon.  Guidelines for how soon to be seen are listed in your After Visit Summary, but are typically between 1-4 weeks after surgery.  OTHER INSTRUCTIONS:   Knee Replacement:  Do not place pillow under knee, focus on keeping the knee straight while resting. CPM instructions: 0-90 degrees, 2 hours in the morning, 2 hours in the afternoon, and 2 hours in the evening. Place foam block, curve side up under heel at all times except when in CPM or when walking.  DO NOT modify, tear, cut, or change the foam block in any way.  POST-OPERATIVE OPIOID TAPER INSTRUCTIONS: It is important to wean off of your opioid medication as soon as possible. If you do not need pain medication after your surgery it is ok to stop day one. Opioids include: Codeine, Hydrocodone(Norco, Vicodin), Oxycodone(Percocet, oxycontin) and hydromorphone amongst others.  Long term and even short term use of opiods can cause: Increased pain response Dependence Constipation Depression Respiratory depression And more.  Withdrawal symptoms can include Flu like symptoms Nausea, vomiting And more Techniques to manage these symptoms Hydrate well Eat regular healthy meals Stay active Use relaxation techniques(deep breathing, meditating, yoga) Do Not substitute Alcohol to help with tapering If you have been on opioids for less than two weeks and do not have pain than it is ok to stop all together.  Plan to wean off of opioids This plan should start within one week post op of your joint replacement. Maintain the same interval or time between taking each dose and first decrease the dose.  Cut the total daily intake of opioids by one tablet each  day Next start to increase the time between doses. The last dose that should be eliminated is the evening dose.   MAKE SURE YOU:  Understand  these instructions.  Get help right away if you are not doing well or get worse.    Thank you for letting us be a part of your medical care team.  It is a privilege we respect greatly.  We hope these instructions will help you stay on track for a fast and full recovery!      

## 2021-04-28 NOTE — Plan of Care (Signed)
°  Problem: Education: Goal: Knowledge of General Education information will improve Description: Including pain rating scale, medication(s)/side effects and non-pharmacologic comfort measures Outcome: Progressing   Problem: Clinical Measurements: Goal: Will remain free from infection Outcome: Progressing   Problem: Pain Managment: Goal: General experience of comfort will improve Outcome: Progressing   Problem: Safety: Goal: Ability to remain free from injury will improve Outcome: [place], RN 04/28/21 8:27 PM

## 2021-04-28 NOTE — Interval H&P Note (Signed)
History and Physical Interval Note:  04/28/2021 1:12 PM  Julia Murray  has presented today for surgery, with the diagnosis of Left knee degenerative joint disease and retained hardware.  The various methods of treatment have been discussed with the patient and family. After consideration of risks, benefits and other options for treatment, the patient has consented to  Procedure(s): TOTAL KNEE ARTHROPLASTY, REMOVAL OF HARDWARE (ACL SCREWS) (Left) as a surgical intervention.  The patient's history has been reviewed, patient examined, no change in status, stable for surgery.  I have reviewed the patient's chart and labs.  Questions were answered to the patient's satisfaction.     Johnn Hai

## 2021-04-28 NOTE — Anesthesia Preprocedure Evaluation (Addendum)
Anesthesia Evaluation  Patient identified by MRN, date of birth, ID band Patient awake    Reviewed: Allergy & Precautions, NPO status , Patient's Chart, lab work & pertinent test results  Airway Mallampati: II  TM Distance: >3 FB Neck ROM: Full    Dental no notable dental hx.    Pulmonary neg pulmonary ROS,    Pulmonary exam normal breath sounds clear to auscultation       Cardiovascular negative cardio ROS Normal cardiovascular exam Rhythm:Regular Rate:Normal     Neuro/Psych Anxiety negative neurological ROS     GI/Hepatic GERD  Medicated and Controlled,(+)     substance abuse  ,   Endo/Other  negative endocrine ROS  Renal/GU negative Renal ROS     Musculoskeletal  (+) Arthritis ,   Abdominal   Peds  Hematology HLD   Anesthesia Other Findings Left knee degenerative joint disease and retained hardware  Reproductive/Obstetrics                            Anesthesia Physical Anesthesia Plan  ASA: 2  Anesthesia Plan: Regional and Spinal   Post-op Pain Management:    Induction:   PONV Risk Score and Plan: 2 and Ondansetron, Dexamethasone, Midazolam, Treatment may vary due to age or medical condition and Propofol infusion  Airway Management Planned: Simple Face Mask  Additional Equipment:   Intra-op Plan:   Post-operative Plan:   Informed Consent: I have reviewed the patients History and Physical, chart, labs and discussed the procedure including the risks, benefits and alternatives for the proposed anesthesia with the patient or authorized representative who has indicated his/her understanding and acceptance.     Dental advisory given  Plan Discussed with: CRNA  Anesthesia Plan Comments:        Anesthesia Quick Evaluation

## 2021-04-28 NOTE — OR Nursing (Signed)
(  2) surgical, arthroscopic screws removed from patient's left knee by Dr. Hyman Bower in OR 6, on 04/28/2021 at 1430.

## 2021-04-28 NOTE — Anesthesia Procedure Notes (Signed)
Spinal  Patient location during procedure: OR Start time: 04/28/2021 1:35 PM End time: 04/28/2021 1:40 PM Reason for block: surgical anesthesia Staffing Performed: anesthesiologist  Anesthesiologist: Murvin Natal, MD Preanesthetic Checklist Completed: patient identified, IV checked, risks and benefits discussed, surgical consent, monitors and equipment checked, pre-op evaluation and timeout performed Spinal Block Patient position: sitting Prep: DuraPrep Patient monitoring: cardiac monitor, continuous pulse ox and blood pressure Approach: midline Location: L3-4 Injection technique: single-shot Needle Needle type: Pencan  Needle gauge: 24 G Needle length: 9 cm Assessment Sensory level: T10 Events: CSF return Additional Notes Functioning IV was confirmed and monitors were applied. Sterile prep and drape, including hand hygiene and sterile gloves were used. The patient was positioned and the spine was prepped. The skin was anesthetized with lidocaine.  Free flow of clear CSF was obtained prior to injecting local anesthetic into the CSF.  The spinal needle aspirated freely following injection.  The needle was carefully withdrawn.  The patient tolerated the procedure well.

## 2021-04-29 DIAGNOSIS — M1712 Unilateral primary osteoarthritis, left knee: Secondary | ICD-10-CM | POA: Diagnosis not present

## 2021-04-29 DIAGNOSIS — K219 Gastro-esophageal reflux disease without esophagitis: Secondary | ICD-10-CM | POA: Diagnosis not present

## 2021-04-29 DIAGNOSIS — E785 Hyperlipidemia, unspecified: Secondary | ICD-10-CM | POA: Diagnosis not present

## 2021-04-29 DIAGNOSIS — Z79899 Other long term (current) drug therapy: Secondary | ICD-10-CM | POA: Diagnosis not present

## 2021-04-29 LAB — BASIC METABOLIC PANEL
Anion gap: 10 (ref 5–15)
BUN: 14 mg/dL (ref 8–23)
CO2: 25 mmol/L (ref 22–32)
Calcium: 8.9 mg/dL (ref 8.9–10.3)
Chloride: 100 mmol/L (ref 98–111)
Creatinine, Ser: 0.51 mg/dL (ref 0.44–1.00)
GFR, Estimated: >60 mL/min (ref >60)
Glucose, Bld: 151 mg/dL — ABNORMAL HIGH (ref 70–99)
Potassium: 4.2 mmol/L (ref 3.5–5.1)
Sodium: 135 mmol/L (ref 135–145)

## 2021-04-29 LAB — CBC
HCT: 36.2 % (ref 36.0–46.0)
Hemoglobin: 11.7 g/dL — ABNORMAL LOW (ref 12.0–15.0)
MCH: 30.5 pg (ref 26.0–34.0)
MCHC: 32.3 g/dL (ref 30.0–36.0)
MCV: 94.5 fL (ref 80.0–100.0)
Platelets: 232 K/uL (ref 150–400)
RBC: 3.83 MIL/uL — ABNORMAL LOW (ref 3.87–5.11)
RDW: 13.9 % (ref 11.5–15.5)
WBC: 11.2 K/uL — ABNORMAL HIGH (ref 4.0–10.5)
nRBC: 0 % (ref 0.0–0.2)

## 2021-04-29 NOTE — TOC Transition Note (Signed)
Transition of Care [facility]) - CM/SW Discharge Note  Patient Details  Name: Julia Murray MRN: [id number] Date of Birth: [birth year]  [facility] CM/SW Contact:  Sherie Don, LCSW Phone Number: 04/29/2021, 9:36 AM  Clinical Narrative: Patient is expected to discharge home tomorrow after working with PT. CSW spoke with patient to confirm discharge plan. Per patient, she will no longer be discharging home with HHPT through [place] and will instead do OPPT at [facility] PT. Patient has a rolling walker, cane, and toilet riser at home so there are no DME needs at this time. TOC signing off.    Final next level of care: OP Rehab Barriers to Discharge: No Barriers Identified  Patient Goals and CMS Choice Patient states their goals for this hospitalization and ongoing recovery are:: Discharge home with OPPT at [facility] Choice offered to / list presented to : NA  Discharge Plan and Services      DME Arranged: N/A DME Agency: NA  Readmission Risk Interventions No flowsheet data found.

## 2021-04-29 NOTE — Plan of Care (Signed)

## 2021-04-29 NOTE — Evaluation (Signed)
Physical Therapy Evaluation Patient Details Name: Julia Murray MRN: [id number] DOB: 02/20/1956 Today's Date: 04/29/2021  History of Present Illness  Pt s/p L TKR  Clinical Impression  Pt s/p L TKR and presents with decreased L LE strength/ROM, post op pain and intermittent nausea limiting functional mobility.  Pt should progress to dc home with family assist and reports first OP PT scheduled for 05/02/21.     Recommendations for follow up therapy are one component of a multi-disciplinary discharge planning process, led by the attending physician.  Recommendations may be updated based on patient status, additional functional criteria and insurance authorization.  Follow Up Recommendations Follow physician's recommendations for discharge plan and follow up therapies    Assistance Recommended at Discharge    Functional Status Assessment Patient has had a recent decline in their functional status and demonstrates the ability to make significant improvements in function in a reasonable and predictable amount of time.  Equipment Recommendations  None recommended by PT    Recommendations for Other Services       Precautions / Restrictions Precautions Precautions: Knee;Fall Required Braces or Orthoses: Knee Immobilizer - Left Knee Immobilizer - Left: Discontinue once straight leg raise with < 10 degree lag Restrictions Weight Bearing Restrictions: No LLE Weight Bearing: Weight bearing as tolerated      Mobility  Bed Mobility Overal bed mobility: Needs Assistance Bed Mobility: Supine to Sit     Supine to sit: Min assist     General bed mobility comments: cues for sequence and use of R LE to self assist    Transfers Overall transfer level: Needs assistance Equipment used: Rolling walker (2 wheels) Transfers: Sit to/from Stand Sit to Stand: Min assist           General transfer comment: cues for LE management and use of UEs to self assist     Ambulation/Gait Ambulation/Gait assistance: Min assist Gait Distance (Feet): 28 Feet Assistive device: Rolling walker (2 wheels) Gait Pattern/deviations: Step-to pattern;Decreased step length - right;Decreased step length - left;Shuffle;Trunk flexed Gait velocity: decr     General Gait Details: cues for sequence, posture and position from RW.  Stairs            Wheelchair Mobility    Modified Rankin (Stroke Patients Only)       Balance Overall balance assessment: Needs assistance Sitting-balance support: Feet supported;No upper extremity supported Sitting balance-Leahy Scale: Good     Standing balance support: Bilateral upper extremity supported Standing balance-Leahy Scale: Poor                               Pertinent Vitals/Pain Pain Assessment: 0-10 Pain Score: 7  Pain Location: L knee Pain Descriptors / Indicators: Aching;Sore Pain Intervention(s): Limited activity within patient's tolerance;Monitored during session;Premedicated before session;Ice applied    Home Living Family/patient expects to be discharged to:: Private residence Living Arrangements: Spouse/significant other Available Help at Discharge: Family Type of Home: House Home Access: Stairs to enter   Technical brewer of Steps: 1+1   Home Layout: Able to live on main level with bedroom/bathroom Home Equipment: Conservation officer, nature (2 wheels);BSC/3in1;Cane - single point      Prior Function Prior Level of Function : Independent/Modified Independent                     Hand Dominance        Extremity/Trunk Assessment   Upper Extremity Assessment Upper Extremity  Assessment: Overall WFL for tasks assessed    Lower Extremity Assessment Lower Extremity Assessment: LLE deficits/detail LLE Deficits / Details: 3/5 quads with IND SLR; AAROM at knee -5 - 80    Cervical / Trunk Assessment Cervical / Trunk Assessment: Normal  Communication   Communication: No  difficulties  Cognition Arousal/Alertness: Awake/alert Behavior During Therapy: WFL for tasks assessed/performed Overall Cognitive Status: Within Functional Limits for tasks assessed                                          General Comments      Exercises Total Joint Exercises Ankle Circles/Pumps: AROM;Both;15 reps;Supine Quad Sets: AROM;Both;10 reps;Supine Heel Slides: AAROM;Left;15 reps;Supine Straight Leg Raises: AAROM;AROM;Left;10 reps;Supine   Assessment/Plan    PT Assessment Patient needs continued PT services  PT Problem List Decreased strength;Decreased range of motion;Decreased activity tolerance;Decreased balance;Decreased mobility;Decreased knowledge of use of DME;Pain       PT Treatment Interventions DME instruction;Gait training;Stair training;Functional mobility training;Therapeutic activities;Therapeutic exercise;Patient/family education    PT Goals (Current goals can be found in the Care Plan section)  Acute Rehab PT Goals Patient Stated Goal: Regain IND PT Goal Formulation: With patient Time For Goal Achievement: 05/06/21 Potential to Achieve Goals: Good    Frequency 7X/week   Barriers to discharge        Co-evaluation               AM-PAC PT "6 Clicks" Mobility  Outcome Measure Help needed turning from your back to your side while in a flat bed without using bedrails?: A Little Help needed moving from lying on your back to sitting on the side of a flat bed without using bedrails?: A Little Help needed moving to and from a bed to a chair (including a wheelchair)?: A Little Help needed standing up from a chair using your arms (e.g., wheelchair or bedside chair)?: A Little Help needed to walk in hospital room?: A Little Help needed climbing 3-5 steps with a railing? : A Lot 6 Click Score: 17    End of Session Equipment Utilized During Treatment: Gait belt Activity Tolerance: Patient tolerated treatment well Patient left: in  chair;with call bell/phone within reach;with chair alarm set Nurse Communication: Mobility status PT Visit Diagnosis: Unsteadiness on feet (R26.81);Difficulty in walking, not elsewhere classified (R26.2);Pain Pain - Right/Left: Left Pain - part of body: Knee    Time: 7588-3254 PT Time Calculation (min) (ACUTE ONLY): 40 min   Charges:   PT Evaluation $PT Eval Low Complexity: 1 Low PT Treatments $Gait Training: 8-22 mins $Therapeutic Exercise: 8-22 mins        Debe Coder PT Acute Rehabilitation Services Pager [phone] Office [phone]   Carlisle Enke 04/29/2021, 1:13 PM

## 2021-04-29 NOTE — Plan of Care (Signed)
  Problem: Activity: Goal: Risk for activity intolerance will decrease Outcome: Progressing   Problem: Pain Managment: Goal: General experience of comfort will improve Outcome: Progressing   Problem: Safety: Goal: Ability to remain free from injury will improve Outcome: Progressing   

## 2021-04-29 NOTE — Progress Notes (Signed)
Subjective: 1 Day Post-Op Procedure(s) (LRB): TOTAL KNEE ARTHROPLASTY, REMOVAL OF HARDWARE (ACL SCREWS) (Left) Patient reports pain as moderate.   +flatus Still has foley Tolerating PO with mild nausea  Objective: Vital signs in last 24 hours: Temp:  [97.5 F (36.4 C)-98 F (36.7 C)] 97.7 F (36.5 C) (12/24 0903) Pulse Rate:  [49-95] 77 (12/24 0903) Resp:  [10-29] 18 (12/24 0903) BP: (119-174)/(67-88) 123/77 (12/24 0903) SpO2:  [96 %-100 %] 97 % (12/24 0903) Weight:  [78.9 kg] 78.9 kg (12/23 1051)  Intake/Output from previous day: 12/23 0701 - 12/24 0700 In: 2860.5 [P.O.:360; I.V.:2100.5; IV Piggyback:400] Out: 2800 [Urine:2700; Blood:100] Intake/Output this shift: No intake/output data recorded.  Recent Labs    04/29/21 0316  HGB 11.7*   Recent Labs    04/29/21 0316  WBC 11.2*  RBC 3.83*  HCT 36.2  PLT 232   Recent Labs    04/29/21 0316  NA 135  K 4.2  CL 100  CO2 25  BUN 14  CREATININE 0.51  GLUCOSE 151*  CALCIUM 8.9   No results for input(s): LABPT, INR in the last 72 hours.  Neurologically intact ABD soft Neurovascular intact Sensation intact distally Intact pulses distally Dorsiflexion/Plantar flexion intact Incision: dressing C/D/I No cellulitis present Compartment soft   Assessment/Plan: 1 Day Post-Op Procedure(s) (LRB): TOTAL KNEE ARTHROPLASTY, REMOVAL OF HARDWARE (ACL SCREWS) (Left) Advance diet Up with therapy DVT ppx: ASA D/C foley today  Likely D/C tomorrow depending on progress with PT   Julia Murray 04/29/2021, 10:11 AM

## 2021-04-29 NOTE — Progress Notes (Signed)
PT Cancellation Note  Patient Details Name: Julia Murray MRN: [id number] DOB: August 21, 1955   Cancelled Treatment:     Pm PT session deferred at request of pt.  Pt states too much pain despite premed, "I am counting the minutes until I can get the pain shot".  Will follow in am.   Isella Slatten 04/29/2021, 4:21 PM

## 2021-04-29 NOTE — Progress Notes (Signed)
Patient asks to wait on d/c her F/C.

## 2021-04-30 DIAGNOSIS — Z79899 Other long term (current) drug therapy: Secondary | ICD-10-CM | POA: Diagnosis not present

## 2021-04-30 DIAGNOSIS — M1712 Unilateral primary osteoarthritis, left knee: Secondary | ICD-10-CM | POA: Diagnosis not present

## 2021-04-30 DIAGNOSIS — E785 Hyperlipidemia, unspecified: Secondary | ICD-10-CM | POA: Diagnosis not present

## 2021-04-30 DIAGNOSIS — K219 Gastro-esophageal reflux disease without esophagitis: Secondary | ICD-10-CM | POA: Diagnosis not present

## 2021-04-30 LAB — CBC
HCT: 30.2 % — ABNORMAL LOW (ref 36.0–46.0)
Hemoglobin: 10.1 g/dL — ABNORMAL LOW (ref 12.0–15.0)
MCH: 31 pg (ref 26.0–34.0)
MCHC: 33.4 g/dL (ref 30.0–36.0)
MCV: 92.6 fL (ref 80.0–100.0)
Platelets: 185 10*3/uL (ref 150–400)
RBC: 3.26 MIL/uL — ABNORMAL LOW (ref 3.87–5.11)
RDW: 14.1 % (ref 11.5–15.5)
WBC: 7.3 10*3/uL (ref 4.0–10.5)
nRBC: 0 % (ref 0.0–0.2)

## 2021-04-30 NOTE — Progress Notes (Signed)
Physical Therapy Treatment Patient Details Name: Julia Murray MRN: [id number] DOB: 08-Apr-1956 Today's Date: 04/30/2021   History of Present Illness Pt s/p L TKR    PT Comments    Pt is cooperative but struggling to progress 2* c/o pain and intermittent nausea.  Pt reports increased pain in R knee as well this am - states "this one is supposed to be next"   Recommendations for follow up therapy are one component of a multi-disciplinary discharge planning process, led by the attending physician.  Recommendations may be updated based on patient status, additional functional criteria and insurance authorization.  Follow Up Recommendations  Follow physician's recommendations for discharge plan and follow up therapies     Assistance Recommended at Discharge Frequent or constant Supervision/Assistance  Equipment Recommendations  None recommended by PT    Recommendations for Other Services       Precautions / Restrictions Precautions Precautions: Knee;Fall Required Braces or Orthoses: Knee Immobilizer - Left Knee Immobilizer - Left: Discontinue once straight leg raise with < 10 degree lag Restrictions Weight Bearing Restrictions: No LLE Weight Bearing: Weight bearing as tolerated     Mobility  Bed Mobility Overal bed mobility: Needs Assistance Bed Mobility: Supine to Sit;Sit to Supine     Supine to sit: Min assist Sit to supine: Min assist   General bed mobility comments: cues for sequence and use of R LE to self assist    Transfers Overall transfer level: Needs assistance Equipment used: Rolling walker (2 wheels) Transfers: Sit to/from Stand Sit to Stand: Mod assist           General transfer comment: cues for LE management and use of UEs to self assist; increased assist to stand with pt c/o R LE pain as well    Ambulation/Gait Ambulation/Gait assistance: Min assist Gait Distance (Feet): 34 Feet Assistive device: Rolling walker (2 wheels) Gait  Pattern/deviations: Step-to pattern;Decreased step length - right;Decreased step length - left;Shuffle;Trunk flexed Gait velocity: decr     General Gait Details: cues for sequence, posture and position from RW.  Pt with noted limited heel contact on L   Stairs             Wheelchair Mobility    Modified Rankin (Stroke Patients Only)       Balance Overall balance assessment: Needs assistance Sitting-balance support: Feet supported;No upper extremity supported Sitting balance-Leahy Scale: Good     Standing balance support: Bilateral upper extremity supported Standing balance-Leahy Scale: Poor                              Cognition Arousal/Alertness: Awake/alert Behavior During Therapy: WFL for tasks assessed/performed Overall Cognitive Status: Within Functional Limits for tasks assessed                                          Exercises      General Comments        Pertinent Vitals/Pain Pain Assessment: 0-10 Pain Score: 6  Pain Location: L knee and to lesser extent R knee Pain Descriptors / Indicators: Aching;Sore Pain Intervention(s): Limited activity within patient's tolerance;Monitored during session;Premedicated before session;Ice applied    Home Living                          Prior Function  PT Goals (current goals can now be found in the care plan section) Acute Rehab PT Goals Patient Stated Goal: Regain IND PT Goal Formulation: With patient Time For Goal Achievement: 05/06/21 Potential to Achieve Goals: Good Progress towards PT goals: Progressing toward goals    Frequency    7X/week      PT Plan Current plan remains appropriate    Co-evaluation              AM-PAC PT "6 Clicks" Mobility   Outcome Measure  Help needed turning from your back to your side while in a flat bed without using bedrails?: A Little Help needed moving from lying on your back to sitting on the side of  a flat bed without using bedrails?: A Little Help needed moving to and from a bed to a chair (including a wheelchair)?: A Little Help needed standing up from a chair using your arms (e.g., wheelchair or bedside chair)?: A Little Help needed to walk in hospital room?: A Little Help needed climbing 3-5 steps with a railing? : A Lot 6 Click Score: 17    End of Session Equipment Utilized During Treatment: Gait belt Activity Tolerance: Patient limited by fatigue;Patient limited by pain Patient left: in bed;with call bell/phone within reach;with nursing/sitter in room Nurse Communication: Mobility status PT Visit Diagnosis: Unsteadiness on feet (R26.81);Difficulty in walking, not elsewhere classified (R26.2);Pain Pain - Right/Left: Left Pain - part of body: Knee     Time: 4132-4401 PT Time Calculation (min) (ACUTE ONLY): 25 min  Charges:  $Gait Training: 23-37 mins                     [place] Pager [phone] Office [phone]    Juletta Berhe 04/30/2021, 1:11 PM

## 2021-04-30 NOTE — Progress Notes (Signed)
PT Cancellation Note  Patient Details Name: Julia Murray MRN: [id number] DOB: 12-May-1955   Cancelled Treatment:     PT pm session attempted x 2 but pt continues with c/o nausea and pain.  Pt did get up to [facility] between PT attempts but states exacerbated nausea/pain.  Will follow in am.   Katricia Prehn 04/30/2021, 3:52 PM

## 2021-04-30 NOTE — Plan of Care (Signed)
  Problem: Activity: Goal: Risk for activity intolerance will decrease Outcome: Progressing   Problem: Pain Managment: Goal: General experience of comfort will improve Outcome: Progressing   Problem: Safety: Goal: Ability to remain free from injury will improve Outcome: Progressing   

## 2021-04-30 NOTE — Progress Notes (Addendum)
Subjective: 2 Days Post-Op Procedure(s) (LRB): TOTAL KNEE ARTHROPLASTY, REMOVAL OF HARDWARE (ACL SCREWS) (Left)  Patient reports pain as moderate but controlled with pain meds.    Objective:   VITALS:  Temp:  [97.9 F (36.6 C)-98.8 F (37.1 C)] 98.8 F (37.1 C) (12/25 0509) Pulse Rate:  [87-103] 103 (12/25 0509) Resp:  [15-18] 15 (12/25 0509) BP: (115-132)/(59-75) 123/66 (12/25 0509) SpO2:  [91 %-99 %] 95 % (12/25 0527)  Neurovascular intact Sensation intact distally Intact pulses distally Dorsiflexion/Plantar flexion intact Incision: dressing C/D/I No cellulitis present Compartment soft   LABS Recent Labs    04/29/21 0316 04/30/21 0309  HGB 11.7* 10.1*  WBC 11.2* 7.3  PLT 232 185   Recent Labs    04/29/21 0316  NA 135  K 4.2  CL 100  CO2 25  BUN 14  CREATININE 0.51  GLUCOSE 151*   No results for input(s): LABPT, INR in the last 72 hours.   Assessment/Plan: 2 Days Post-Op Procedure(s) (LRB): TOTAL KNEE ARTHROPLASTY, REMOVAL OF HARDWARE (ACL SCREWS) (Left)  Advance diet Up with therapy Discharge home with home health pending pain control  Armond Hang 04/30/2021, 9:32 AM

## 2021-04-30 NOTE — Progress Notes (Signed)
Discussed with pt on dc F/c due to potential of infection. Patient asks to be dc after her therapy.

## 2021-05-01 DIAGNOSIS — K219 Gastro-esophageal reflux disease without esophagitis: Secondary | ICD-10-CM | POA: Diagnosis not present

## 2021-05-01 DIAGNOSIS — M1712 Unilateral primary osteoarthritis, left knee: Secondary | ICD-10-CM | POA: Diagnosis not present

## 2021-05-01 DIAGNOSIS — Z79899 Other long term (current) drug therapy: Secondary | ICD-10-CM | POA: Diagnosis not present

## 2021-05-01 DIAGNOSIS — E785 Hyperlipidemia, unspecified: Secondary | ICD-10-CM | POA: Diagnosis not present

## 2021-05-01 LAB — CBC
HCT: 31.8 % — ABNORMAL LOW (ref 36.0–46.0)
Hemoglobin: 10.4 g/dL — ABNORMAL LOW (ref 12.0–15.0)
MCH: 30.4 pg (ref 26.0–34.0)
MCHC: 32.7 g/dL (ref 30.0–36.0)
MCV: 93 fL (ref 80.0–100.0)
Platelets: 204 10*3/uL (ref 150–400)
RBC: 3.42 MIL/uL — ABNORMAL LOW (ref 3.87–5.11)
RDW: 14.2 % (ref 11.5–15.5)
WBC: 8.5 10*3/uL (ref 4.0–10.5)
nRBC: 0 % (ref 0.0–0.2)

## 2021-05-01 NOTE — Progress Notes (Signed)
Physical Therapy Treatment Patient Details Name: Julia Murray MRN: [id number] DOB: Jul 15, 1955 Today's Date: 05/01/2021   History of Present Illness Pt s/p L TKR    PT Comments    Pt continues very cooperative but with continued limitations 2* non-operative knee restricting ability with transfers and stairs.  Pt spouse present for training to review HEP, KI, bed mobility and transfers.  Pt unable to complete step fwd or backward but able to sit in chair at edge of step to be pulled back.  Pt spouse states he has access to wc to assist pt into home.  Pt would best be served by change to HHPT from OP PT based on pt's difficulty accessing home at her current ability level.     Recommendations for follow up therapy are one component of a multi-disciplinary discharge planning process, led by the attending physician.  Recommendations may be updated based on patient status, additional functional criteria and insurance authorization.  Follow Up Recommendations  Home health PT     Assistance Recommended at Discharge Frequent or constant Supervision/Assistance  Equipment Recommendations  None recommended by PT    Recommendations for Other Services       Precautions / Restrictions Precautions Precautions: Knee;Fall Required Braces or Orthoses: Knee Immobilizer - Left Knee Immobilizer - Left: Discontinue once straight leg raise with < 10 degree lag Restrictions Weight Bearing Restrictions: No     Mobility  Bed Mobility Overal bed mobility: Needs Assistance Bed Mobility: Supine to Sit;Sit to Supine     Supine to sit: Min assist Sit to supine: Min assist   General bed mobility comments: cues for sequence and use of R LE to self assist    Transfers Overall transfer level: Needs assistance Equipment used: Rolling walker (2 wheels) Transfers: Sit to/from Stand Sit to Stand: Min assist;Mod assist           General transfer comment: cues for LE management and use of UEs to self  assist; increased assist to stand with pt c/o R knee pain as well    Ambulation/Gait Ambulation/Gait assistance: Min assist;Min guard Gait Distance (Feet): 100 Feet Assistive device: Rolling walker (2 wheels) Gait Pattern/deviations: Step-to pattern;Decreased step length - right;Decreased step length - left;Shuffle;Trunk flexed Gait velocity: decr     General Gait Details: cues for sequence, posture and position from RW.  Initial instability requiring min assist for balance/support but progressing to min guard   Stairs Stairs: Yes Stairs assistance: Min assist;Mod assist;+2 safety/equipment Stair Management: No rails;Step to pattern;Backwards Number of Stairs: 1 General stair comments: Pt unable to climb stairs 2* limitations of non-operative knee.  Pt backed up to chair on single step to sit.  Spouse states he can arrange for wc.   Wheelchair Mobility    Modified Rankin (Stroke Patients Only)       Balance Overall balance assessment: Needs assistance Sitting-balance support: Feet supported;No upper extremity supported Sitting balance-Leahy Scale: Good     Standing balance support: Bilateral upper extremity supported Standing balance-Leahy Scale: Poor Standing balance comment: Poor on intial standing but improved with increased time standin/ambulating                            Cognition Arousal/Alertness: Awake/alert Behavior During Therapy: WFL for tasks assessed/performed Overall Cognitive Status: Within Functional Limits for tasks assessed  Exercises Total Joint Exercises Ankle Circles/Pumps: AROM;Both;15 reps;Supine Quad Sets: AROM;Both;10 reps;Supine Heel Slides: AAROM;Left;Supine;10 reps Hip ABduction/ADduction: AAROM;Left;10 reps;Supine Straight Leg Raises: AAROM;Left;Supine;10 reps    General Comments        Pertinent Vitals/Pain Pain Assessment: 0-10 Pain Score: 6  Pain  Location: Bil knees Pain Descriptors / Indicators: Aching;Sore Pain Intervention(s): Limited activity within patient's tolerance;Monitored during session;Patient requesting pain meds-RN notified;Ice applied    Home Living                          Prior Function            PT Goals (current goals can now be found in the care plan section) Acute Rehab PT Goals Patient Stated Goal: Regain IND PT Goal Formulation: With patient Time For Goal Achievement: 05/06/21 Potential to Achieve Goals: Good Progress towards PT goals: Progressing toward goals    Frequency    7X/week      PT Plan Current plan remains appropriate;Discharge plan needs to be updated    Co-evaluation              AM-PAC PT "6 Clicks" Mobility   Outcome Measure  Help needed turning from your back to your side while in a flat bed without using bedrails?: A Little Help needed moving from lying on your back to sitting on the side of a flat bed without using bedrails?: A Little Help needed moving to and from a bed to a chair (including a wheelchair)?: A Lot Help needed standing up from a chair using your arms (e.g., wheelchair or bedside chair)?: A Lot Help needed to walk in hospital room?: A Little Help needed climbing 3-5 steps with a railing? : A Lot 6 Click Score: 15    End of Session Equipment Utilized During Treatment: Gait belt;Left knee immobilizer Activity Tolerance: Patient tolerated treatment well Patient left: in bed;with call bell/phone within reach;with family/visitor present Nurse Communication: Mobility status PT Visit Diagnosis: Unsteadiness on feet (R26.81);Difficulty in walking, not elsewhere classified (R26.2);Pain Pain - Right/Left: Left Pain - part of body: Knee     Time: 4462-8638 PT Time Calculation (min) (ACUTE ONLY): 51 min  Charges:  $Gait Training: 8-22 mins $Therapeutic Exercise: 8-22 mins $Therapeutic Activity: 8-22 mins                     Debe Coder PT Acute Rehabilitation Services Pager [phone] Office [phone]    [place] Monday 05/01/2021, 3:11 PM

## 2021-05-01 NOTE — Progress Notes (Signed)
Subjective: 3 Days Post-Op Procedure(s) (LRB): TOTAL KNEE ARTHROPLASTY, REMOVAL OF HARDWARE (ACL SCREWS) (Left) Patient seen in rounds for Dr. Tonita Cong Patient reports pain as mild.  Well controlled  this am Unfortunately was unable to get up with PT yesterday afternoon due to Nausea. She reports this is better this am but has not eaten yet.   Objective: Vital signs in last 24 hours: Temp:  [98.7 F (37.1 C)-99.6 F (37.6 C)] 98.7 F (37.1 C) (12/26 0527) Pulse Rate:  [93-110] 103 (12/26 0527) Resp:  [15-17] 17 (12/26 0527) BP: (111-135)/(51-70) 115/67 (12/26 0527) SpO2:  [94 %-96 %] 96 % (12/26 0527)  Intake/Output from previous day: 12/25 0701 - 12/26 0700 In: 840 [P.O.:840] Out: 1100 [Urine:1100] Intake/Output this shift: No intake/output data recorded.  Recent Labs    04/29/21 0316 04/30/21 0309 05/01/21 0318  HGB 11.7* 10.1* 10.4*   Recent Labs    04/30/21 0309 05/01/21 0318  WBC 7.3 8.5  RBC 3.26* 3.42*  HCT 30.2* 31.8*  PLT 185 204   Recent Labs    04/29/21 0316  NA 135  K 4.2  CL 100  CO2 25  BUN 14  CREATININE 0.51  GLUCOSE 151*  CALCIUM 8.9   No results for input(s): LABPT, INR in the last 72 hours.  Neurologically intact Neurovascular intact Sensation intact distally Intact pulses distally Dorsiflexion/Plantar flexion intact Incision: dressing C/D/I No cellulitis present Compartment soft   Assessment/Plan: 3 Days Post-Op Procedure(s) (LRB): TOTAL KNEE ARTHROPLASTY, REMOVAL OF HARDWARE (ACL SCREWS) (Left) Up with therapy, hopefully Nausea is better today Once cleared by PT patient is okay to be d/c home    Nettie Elm [facility] [phone] 05/01/2021, 7:18 AM

## 2021-05-01 NOTE — Plan of Care (Signed)
Plan of care reviewed and discussed with the patient. 

## 2021-05-01 NOTE — Progress Notes (Signed)
Physical Therapy Treatment Patient Details Name: Julia Murray MRN: [id number] DOB: 07-Jun-1955 Today's Date: 05/01/2021   History of Present Illness Pt s/p L TKR    PT Comments    Pt very cooperative and with no c/o nausea and improved L knee pain control but now limited by R knee pain/weakness.  Pt performed therex program with assist, up to standing with increased assist 2* R knee issue, ambulated increased distance in hall, reviewed don/doff KI, and reviewed options for managing stairs at home.  Pt with c/o mild dizziness and "feeling hot" with activity - BP supine 124/62 and after ambulation 107/68 - RN aware.  Recommendations for follow up therapy are one component of a multi-disciplinary discharge planning process, led by the attending physician.  Recommendations may be updated based on patient status, additional functional criteria and insurance authorization.  Follow Up Recommendations  Follow physician's recommendations for discharge plan and follow up therapies     Assistance Recommended at Discharge Frequent or constant Supervision/Assistance  Equipment Recommendations  None recommended by PT    Recommendations for Other Services       Precautions / Restrictions Precautions Precautions: Knee;Fall Required Braces or Orthoses: Knee Immobilizer - Left Knee Immobilizer - Left: Discontinue once straight leg raise with < 10 degree lag Restrictions Weight Bearing Restrictions: No LLE Weight Bearing: Weight bearing as tolerated     Mobility  Bed Mobility Overal bed mobility: Needs Assistance Bed Mobility: Supine to Sit     Supine to sit: Min assist     General bed mobility comments: cues for sequence and use of R LE to self assist    Transfers Overall transfer level: Needs assistance Equipment used: Rolling walker (2 wheels) Transfers: Sit to/from Stand Sit to Stand: Mod assist           General transfer comment: cues for LE management and use of UEs to  self assist; increased assist to stand with pt c/o R knee pain as well    Ambulation/Gait Ambulation/Gait assistance: Min assist;Min guard Gait Distance (Feet): 61 Feet Assistive device: Rolling walker (2 wheels) Gait Pattern/deviations: Step-to pattern;Decreased step length - right;Decreased step length - left;Shuffle;Trunk flexed Gait velocity: decr     General Gait Details: cues for sequence, posture and position from RW.  Initial instability requiring min assist for balance/support but progressing to min guard   Stairs             Wheelchair Mobility    Modified Rankin (Stroke Patients Only)       Balance Overall balance assessment: Needs assistance Sitting-balance support: Feet supported;No upper extremity supported Sitting balance-Leahy Scale: Good     Standing balance support: Bilateral upper extremity supported Standing balance-Leahy Scale: Poor                              Cognition Arousal/Alertness: Awake/alert Behavior During Therapy: WFL for tasks assessed/performed Overall Cognitive Status: Within Functional Limits for tasks assessed                                          Exercises Total Joint Exercises Ankle Circles/Pumps: AROM;Both;15 reps;Supine Quad Sets: AROM;Both;10 reps;Supine Heel Slides: AAROM;Left;15 reps;Supine Straight Leg Raises: AAROM;Left;Supine;20 reps    General Comments        Pertinent Vitals/Pain Pain Assessment: 0-10 Pain Score: 5  Pain Location:  Bil knees Pain Descriptors / Indicators: Aching;Sore Pain Intervention(s): Limited activity within patient's tolerance;Monitored during session;Premedicated before session;Ice applied    Home Living                          Prior Function            PT Goals (current goals can now be found in the care plan section) Acute Rehab PT Goals Patient Stated Goal: Regain IND PT Goal Formulation: With patient Time For Goal  Achievement: 05/06/21 Potential to Achieve Goals: Good Progress towards PT goals: Progressing toward goals;Not progressing toward goals - comment (new limitations 2* R knee pain/weakness)    Frequency    7X/week      PT Plan Current plan remains appropriate    Co-evaluation              AM-PAC PT "6 Clicks" Mobility   Outcome Measure  Help needed turning from your back to your side while in a flat bed without using bedrails?: A Little Help needed moving from lying on your back to sitting on the side of a flat bed without using bedrails?: A Little Help needed moving to and from a bed to a chair (including a wheelchair)?: A Lot Help needed standing up from a chair using your arms (e.g., wheelchair or bedside chair)?: A Lot Help needed to walk in hospital room?: A Little Help needed climbing 3-5 steps with a railing? : A Lot 6 Click Score: 15    End of Session Equipment Utilized During Treatment: Gait belt;Left knee immobilizer Activity Tolerance: Patient limited by fatigue;Patient limited by pain Patient left: in chair;with call bell/phone within reach Nurse Communication: Mobility status PT Visit Diagnosis: Unsteadiness on feet (R26.81);Difficulty in walking, not elsewhere classified (R26.2);Pain Pain - Right/Left: Left Pain - part of body: Knee     Time: 0923-1012 PT Time Calculation (min) (ACUTE ONLY): 49 min  Charges:  $Gait Training: 8-22 mins $Therapeutic Exercise: 8-22 mins $Therapeutic Activity: 8-22 mins                     Debe Coder PT Acute Rehabilitation Services Pager [phone] Office [phone]    Levie Owensby 05/01/2021, 10:32 AM

## 2021-05-02 ENCOUNTER — Encounter (HOSPITAL_COMMUNITY): Payer: Self-pay | Admitting: Specialist

## 2021-05-02 DIAGNOSIS — E785 Hyperlipidemia, unspecified: Secondary | ICD-10-CM | POA: Diagnosis not present

## 2021-05-02 DIAGNOSIS — K219 Gastro-esophageal reflux disease without esophagitis: Secondary | ICD-10-CM | POA: Diagnosis not present

## 2021-05-02 DIAGNOSIS — Z79899 Other long term (current) drug therapy: Secondary | ICD-10-CM | POA: Diagnosis not present

## 2021-05-02 DIAGNOSIS — M1712 Unilateral primary osteoarthritis, left knee: Secondary | ICD-10-CM | POA: Diagnosis not present

## 2021-05-02 NOTE — Progress Notes (Signed)
Physical Therapy Treatment Patient Details Name: Julia Murray MRN: [id number] DOB: 07-Feb-1956 Today's Date: 05/02/2021   History of Present Illness L TKR 04/28/2021    PT Comments    POD # 4 Spouse present and "hands on" assist pt throughout session under Therapist direction. Pt was able to perform active SLR so D/C the KI.  Pt was able to self get to EOB.  Instructed on dangling and performing knee flexion/stretching while EOB.  Assisted OOB to amb.  General Gait Details: increased time had Spouse "hands on" amb pt in hallway with VC's on safe handling and activity level once home. General stair comments: first had pt sit in a whellchair with B foot rests and had Spouse "hands on" assist "Bump up" backward one step with Therapist in front.  Spouse stated he will have 2 other strong guys help him.  Second, we attempted pt to step up backward with walker one step but she was unable to complete due to increased pain and her "other" knee unable to support her.  Pt needs a TKR on that knee as well.  For now, Spouse plans to use the wheelchair to get pt in house but he won't have extra help "all the other days she needs to go OP".  Pt will need HH PT vs OP to minimize risk. Returned to room and performed seated TE's following HEP handout.  Instructed on proper tech, freq as well as use of ICE. Pt has met mobility goals to D/C to home after one session but will need HH PT vs OP.  Reported to RN.   Recommendations for follow up therapy are one component of a multi-disciplinary discharge planning process, led by the attending physician.  Recommendations may be updated based on patient status, additional functional criteria and insurance authorization.  Follow Up Recommendations  Home health PT     Assistance Recommended at Discharge    Equipment Recommendations  None recommended by PT    Recommendations for Other Services       Precautions / Restrictions Precautions Precautions:  Knee;Fall Precaution Comments: instructed no pillow under knee     Mobility  Bed Mobility   Bed Mobility: Supine to Sit     Supine to sit: Supervision;Min guard     General bed mobility comments: with increased time pt was self able to transition to EOB.    Transfers Overall transfer level: Needs assistance Equipment used: Rolling walker (2 wheels) Transfers: Sit to/from Stand Sit to Stand: Supervision           General transfer comment: 25% VC's to extend L LE prior to sit.  Increased time was self able.    Ambulation/Gait Ambulation/Gait assistance: Supervision;Min guard Gait Distance (Feet): 55 Feet Assistive device: Rolling walker (2 wheels) Gait Pattern/deviations: Step-to pattern;Decreased step length - right;Decreased step length - left;Shuffle;Trunk flexed Gait velocity: decreased     General Gait Details: increased time had Spouse "hands on" amb pt in hallway with VC's on safe handling and activity level once home.   Stairs Stairs: Yes Stairs assistance: Min assist Stair Management: No rails;Step to pattern;Backwards;Wheelchair Number of Stairs: 1 General stair comments: first had pt sit in a whellchair with B foot rests and had Spouse "hands on" assist "Bump up" backward one step with Therapist in front.  Spouse stated he will have 2 other strong guys help him.  Second, we attempted pt to step up backward with walker one step but she was unable to complete due to  increased pain and her "other" knee unable to support her.  Pt needs a TKR on that knee as well.  For now, Spouse plans to use the wheelchair to get pt in house but he won't have extra help "all the other days she needs to go OP".  Pt will need HH PT vs OP to minimize risk.   Wheelchair Mobility    Modified Rankin (Stroke Patients Only)       Balance                                            Cognition Arousal/Alertness: Awake/alert Behavior During Therapy: WFL for  tasks assessed/performed Overall Cognitive Status: Within Functional Limits for tasks assessed                                 General Comments: AxO x 3 very pleasant        Exercises  05 reps all seated TE's following HEP handout     General Comments        Pertinent Vitals/Pain Pain Assessment: 0-10 Pain Score: 5  Pain Location: Bil knees Pain Descriptors / Indicators: Aching;Sore;Operative site guarding;Grimacing Pain Intervention(s): Monitored during session;Premedicated before session;Repositioned;Ice applied    Home Living                          Prior Function            PT Goals (current goals can now be found in the care plan section) Progress towards PT goals: Progressing toward goals    Frequency    7X/week      PT Plan Current plan remains appropriate;Discharge plan needs to be updated    Co-evaluation              AM-PAC PT "6 Clicks" Mobility   Outcome Measure  Help needed turning from your back to your side while in a flat bed without using bedrails?: A Little Help needed moving from lying on your back to sitting on the side of a flat bed without using bedrails?: A Little Help needed moving to and from a bed to a chair (including a wheelchair)?: A Little Help needed standing up from a chair using your arms (e.g., wheelchair or bedside chair)?: A Little Help needed to walk in hospital room?: A Little Help needed climbing 3-5 steps with a railing? : Total 6 Click Score: 16    End of Session Equipment Utilized During Treatment: Gait belt Activity Tolerance: Patient tolerated treatment well Patient left: in chair;with call bell/phone within reach;with family/visitor present Nurse Communication: Mobility status (pt has met Therapy goals but will need HH PT vs OP) PT Visit Diagnosis: Unsteadiness on feet (R26.81);Difficulty in walking, not elsewhere classified (R26.2);Pain Pain - Right/Left: Left Pain - part of  body: Knee     Time: 1011-1056 PT Time Calculation (min) (ACUTE ONLY): 45 min  Charges:  $Gait Training: 8-22 mins $Therapeutic Exercise: 8-22 mins $Therapeutic Activity: 8-22 mins                     {Janique Hoefer  PTA Acute  Rehabilitation Services Pager      [phone] Office      [phone]

## 2021-05-02 NOTE — Progress Notes (Signed)
Subjective: 4 Days Post-Op Procedure(s) (LRB): TOTAL KNEE ARTHROPLASTY, REMOVAL OF HARDWARE (ACL SCREWS) (Left) Patient reports pain as moderate.   Seen by Dr Tonita Cong today Asking about home PT Ready to go today  Objective: Vital signs in last 24 hours: Temp:  [98.6 F (37 C)-99.6 F (37.6 C)] 98.6 F (37 C) (12/27 0856) Pulse Rate:  [94-99] 99 (12/27 0856) Resp:  [16-18] 16 (12/27 0856) BP: (111-127)/(61-72) 111/72 (12/27 0856) SpO2:  [94 %-98 %] 95 % (12/27 0856)  Intake/Output from previous day: No intake/output data recorded. Intake/Output this shift: No intake/output data recorded.  Recent Labs    04/30/21 0309 05/01/21 0318  HGB 10.1* 10.4*   Recent Labs    04/30/21 0309 05/01/21 0318  WBC 7.3 8.5  RBC 3.26* 3.42*  HCT 30.2* 31.8*  PLT 185 204   No results for input(s): NA, K, CL, CO2, BUN, CREATININE, GLUCOSE, CALCIUM in the last 72 hours. No results for input(s): LABPT, INR in the last 72 hours.  Neurologically intact ABD soft Neurovascular intact Sensation intact distally Intact pulses distally Dorsiflexion/Plantar flexion intact Incision: dressing C/D/I and no drainage No cellulitis present Compartment soft Moderate swelling  Assessment/Plan: 4 Days Post-Op Procedure(s) (LRB): TOTAL KNEE ARTHROPLASTY, REMOVAL OF HARDWARE (ACL SCREWS) (Left) Advance diet Up with therapy D/C IV fluids Arrange home PT- order placed . Initial plan was outpt D/C to home today Meds sent last week Discussed D/C instructions  Cecilie Kicks 05/02/2021, 12:44 PM

## 2021-05-02 NOTE — Plan of Care (Signed)

## 2021-05-02 NOTE — TOC Transition Note (Signed)
Transition of Care [facility]) - CM/SW Discharge Note   Patient Details  Name: LAQUILLA DAULT MRN: [id number] Date of Birth: 01/02/1956  [facility] CM/SW Contact:  Lennart Pall, LCSW Phone Number: 05/02/2021, 1:38 PM   Clinical Narrative:    Pt and spouse are now requesting HHPT be arranged and planning to cancel the [place] appointments.  Of note, [facility] unable to provide services due to staffing.  Referral placed with [facility].  No further TOC needs.   Final next level of care: [place] Barriers to Discharge: No Barriers Identified   Patient Goals and CMS Choice Patient states their goals for this hospitalization and ongoing recovery are:: dc home with home health   Choice offered to / list presented to : NA  Discharge Placement                       Discharge Plan and Services                DME Arranged: N/A DME Agency: NA       HH Arranged: PT HH Agency: [place] Date [facility] Agency Contacted: 05/02/21 Time [place]: [id number] Representative spoke with at [place]: [place] ([place]) Interventions     Readmission Risk Interventions No flowsheet data found.

## 2021-05-02 NOTE — Progress Notes (Signed)
Provided discharge education/instructions, all questions and concerns addressed, Pt not in acute distress, discharged home with belongings accompanied by husband.

## 2021-05-02 NOTE — Discharge Summary (Signed)
Physician Discharge Summary   Patient ID: Julia Murray MRN: [id number] DOB/AGE: [birth year] 65 y.o.  Admit date: 04/28/2021 Discharge date: 05/02/21  Primary Diagnosis: left knee primary osteoarthritis  Admission Diagnoses:  Past Medical History:  Diagnosis Date   Arthritis    knee, back   GERD (gastroesophageal reflux disease)    Hyperlipidemia    Discharge Diagnoses:   Principal Problem:   Left knee DJD  Estimated body mass index is 28.08 kg/m as calculated from the following:   Height as of this encounter: 5\' 6"  (1.676 m).   Weight as of this encounter: 78.9 kg.  Procedure:  Procedure(s) (LRB): TOTAL KNEE ARTHROPLASTY, REMOVAL OF HARDWARE (ACL SCREWS) (Left)   Consults: None  HPI: see H&P Laboratory Data: Admission on 04/28/2021  Component Date Value Ref Range Status   WBC 04/29/2021 11.2 (H)  4.0 - 10.5 K/uL Final   RBC 04/29/2021 3.83 (L)  3.87 - 5.11 MIL/uL Final   Hemoglobin 04/29/2021 11.7 (L)  12.0 - 15.0 g/dL Final   HCT 04/29/2021 36.2  36.0 - 46.0 % Final   MCV 04/29/2021 94.5  80.0 - 100.0 fL Final   MCH 04/29/2021 30.5  26.0 - 34.0 pg Final   MCHC 04/29/2021 32.3  30.0 - 36.0 g/dL Final   RDW 04/29/2021 13.9  11.5 - 15.5 % Final   Platelets 04/29/2021 232  150 - 400 K/uL Final   nRBC 04/29/2021 0.0  0.0 - 0.2 % Final   Performed at [facility], [place] [address] [place], [place] [address]   Sodium 04/29/2021 135  135 - 145 mmol/L Final   Potassium 04/29/2021 4.2  3.5 - 5.1 mmol/L Final   Chloride 04/29/2021 100  98 - 111 mmol/L Final   CO2 04/29/2021 25  22 - 32 mmol/L Final   Glucose, Bld 04/29/2021 151 (H)  70 - 99 mg/dL Final   Glucose reference range applies only to samples taken after fasting for at least 8 hours.   BUN 04/29/2021 14  8 - 23 mg/dL Final   Creatinine, Ser 04/29/2021 0.51  0.44 - 1.00 mg/dL Final   Calcium 04/29/2021 8.9  8.9 - 10.3 mg/dL Final   GFR, Estimated 04/29/2021 >60  >60 mL/min Final   Comment:  (NOTE) Calculated using the CKD-EPI Creatinine Equation (2021)    Anion gap 04/29/2021 10  5 - 15 Final   Performed at [facility], [place] [address] [place], [place] [address]   WBC 04/30/2021 7.3  4.0 - 10.5 K/uL Final   RBC 04/30/2021 3.26 (L)  3.87 - 5.11 MIL/uL Final   Hemoglobin 04/30/2021 10.1 (L)  12.0 - 15.0 g/dL Final   HCT 04/30/2021 30.2 (L)  36.0 - 46.0 % Final   MCV 04/30/2021 92.6  80.0 - 100.0 fL Final   MCH 04/30/2021 31.0  26.0 - 34.0 pg Final   MCHC 04/30/2021 33.4  30.0 - 36.0 g/dL Final   RDW 04/30/2021 14.1  11.5 - 15.5 % Final   Platelets 04/30/2021 185  150 - 400 K/uL Final   nRBC 04/30/2021 0.0  0.0 - 0.2 % Final   Performed at [facility], [place] [address] [place], [place] [address]   WBC 05/01/2021 8.5  4.0 - 10.5 K/uL Final   RBC 05/01/2021 3.42 (L)  3.87 - 5.11 MIL/uL Final   Hemoglobin 05/01/2021 10.4 (L)  12.0 - 15.0 g/dL Final   HCT 05/01/2021 31.8 (L)  36.0 - 46.0 % Final  MCV 05/01/2021 93.0  80.0 - 100.0 fL Final   MCH 05/01/2021 30.4  26.0 - 34.0 pg Final   MCHC 05/01/2021 32.7  30.0 - 36.0 g/dL Final   RDW 05/01/2021 14.2  11.5 - 15.5 % Final   Platelets 05/01/2021 204  150 - 400 K/uL Final   nRBC 05/01/2021 0.0  0.0 - 0.2 % Final   Performed at [facility], [place] [address] [place], [place] [address]  Orders Only on 04/26/2021  Component Date Value Ref Range Status   SARS Coronavirus 2 04/26/2021 RESULT: NEGATIVE   Final   Comment: RESULT: NEGATIVESARS-CoV-2 INTERPRETATION:A NEGATIVE  test result means that SARS-CoV-2 RNA was not present in the specimen above the limit of detection of this test. This does not preclude a possible SARS-CoV-2 infection and should not be used as the  sole basis for patient management decisions. Negative results must be combined with clinical observations, patient history, and epidemiological information. Optimum specimen types and timing for peak viral levels  during infections caused by SARS-CoV-2  have not been determined. Collection of multiple specimens or types of specimens may be necessary to detect virus. Improper specimen collection and handling, sequence variability under primers/probes, or organism present below the limit of detection may  lead to false negative results. Positive and negative predictive values of testing are highly dependent on prevalence. False negative test results are more likely when prevalence of disease is high.The expected result is NEGATIVE.Fact S                          heet for  Healthcare Providers: LocalChronicle.no Sheet for Patients: SalonLookup.es Reference Range - Negative   Hospital Outpatient Visit on 04/18/2021  Component Date Value Ref Range Status   WBC 04/18/2021 5.2  4.0 - 10.5 K/uL Final   RBC 04/18/2021 4.31  3.87 - 5.11 MIL/uL Final   Hemoglobin 04/18/2021 13.0  12.0 - 15.0 g/dL Final   HCT 04/18/2021 41.0  36.0 - 46.0 % Final   MCV 04/18/2021 95.1  80.0 - 100.0 fL Final   MCH 04/18/2021 30.2  26.0 - 34.0 pg Final   MCHC 04/18/2021 31.7  30.0 - 36.0 g/dL Final   RDW 04/18/2021 14.4  11.5 - 15.5 % Final   Platelets 04/18/2021 264  150 - 400 K/uL Final   nRBC 04/18/2021 0.0  0.0 - 0.2 % Final   Performed at [facility], [place] [address], [place], [place] [address]   Sodium 04/18/2021 140  135 - 145 mmol/L Final   Potassium 04/18/2021 4.8  3.5 - 5.1 mmol/L Final   Chloride 04/18/2021 104  98 - 111 mmol/L Final   CO2 04/18/2021 28  22 - 32 mmol/L Final   Glucose, Bld 04/18/2021 100 (H)  70 - 99 mg/dL Final   Glucose reference range applies only to samples taken after fasting for at least 8 hours.   BUN 04/18/2021 23  8 - 23 mg/dL Final   Creatinine, Ser 04/18/2021 0.46  0.44 - 1.00 mg/dL Final   Calcium 04/18/2021 9.5  8.9 - 10.3 mg/dL Final   GFR, Estimated 04/18/2021 >60  >60 mL/min Final   Comment:  (NOTE) Calculated using the CKD-EPI Creatinine Equation (2021)    Anion gap 04/18/2021 8  5 - 15 Final   Performed at [facility], [place] [address], [place], [place] [address]   Prothrombin Time 04/18/2021 12.1  11.4 - 15.2 seconds Final  INR 04/18/2021 0.9  0.8 - 1.2 Final   Comment: (NOTE) INR goal varies based on device and disease states. Performed at [facility], [place] [address] [place], [place] [address]    Color, Urine 04/18/2021 STRAW (A)  YELLOW Final   APPearance 04/18/2021 CLEAR  CLEAR Final   Specific Gravity, Urine 04/18/2021 1.017  1.005 - 1.030 Final   pH 04/18/2021 7.0  5.0 - 8.0 Final   Glucose, UA 04/18/2021 NEGATIVE  NEGATIVE mg/dL Final   Hgb urine dipstick 04/18/2021 NEGATIVE  NEGATIVE Final   Bilirubin Urine 04/18/2021 NEGATIVE  NEGATIVE Final   Ketones, ur 04/18/2021 NEGATIVE  NEGATIVE mg/dL Final   Protein, ur 04/18/2021 NEGATIVE  NEGATIVE mg/dL Final   Nitrite 04/18/2021 NEGATIVE  NEGATIVE Final   Leukocytes,Ua 04/18/2021 NEGATIVE  NEGATIVE Final   Performed at [place] [address], [place], [place] [address]   aPTT 04/18/2021 29  24 - 36 seconds Final   Performed at [facility], [place] [address], [place], [place] [address]   MRSA, PCR 04/18/2021 NEGATIVE  NEGATIVE Final   Staphylococcus aureus 04/18/2021 NEGATIVE  NEGATIVE Final   Comment: (NOTE) The Xpert SA Assay (FDA approved for NASAL specimens in patients 53 years of age and older), is one component of a comprehensive surveillance program. It is not intended to diagnose infection nor to guide or monitor treatment. Performed at [facility], [place] [address], [place], [place] [address]      X-Rays:DG Knee 2 Views Left  Result Date: 04/28/2021 CLINICAL DATA:  Left total knee arthroplasty EXAM: LEFT KNEE - 1-2 VIEW COMPARISON:  None. FINDINGS: Postsurgical changes from left total knee arthroplasty.  Arthroplasty components are in their expected alignment. No periprosthetic fracture or evidence of other complication. A screw fragment is seen within the distal femur. Expected postoperative changes within the overlying soft tissues. IMPRESSION: Satisfactory postoperative appearance status post left total knee arthroplasty without evidence of immediate postoperative complication. Electronically Signed   By: Davina Poke D.O.   On: 04/28/2021 16:58    EKG: Orders placed or performed in visit on 02/22/20   EKG 12-Lead     Hospital Course: STEPHAN DRAUGHN is a 62 y.o. who was admitted to [facility]. They were brought to the operating room on 04/28/2021 and underwent Procedure(s): TOTAL KNEE ARTHROPLASTY, REMOVAL OF HARDWARE (ACL SCREWS).  Patient tolerated the procedure well and was later transferred to the recovery room and then to the orthopaedic floor for postoperative care.  They were given PO and IV analgesics for pain control following their surgery.  They were given 24 hours of postoperative antibiotics of  Anti-infectives (From admission, onward)    Start     Dose/Rate Route Frequency Ordered Stop   04/28/21 2000  ceFAZolin (ANCEF) IVPB 2g/100 mL premix        2 g 200 mL/hr over 30 Minutes Intravenous Every 6 hours 04/28/21 1724 04/29/21 0610   04/28/21 1045  ceFAZolin (ANCEF) IVPB 2g/100 mL premix        2 g 200 mL/hr over 30 Minutes Intravenous On call to O.R. 04/28/21 1034 04/28/21 1340      and started on DVT prophylaxis in the form of Aspirin, TED hose, and SCDs .   PT and OT were ordered for total joint protocol.  Discharge planning consulted to help with postop disposition and equipment needs.  Patient had a fair night on the evening of surgery.  They started  to get up OOB with therapy on day one. Continued to work with therapy into day two.  By day four, the patient had progressed with therapy and meeting their goals.  Incision was healing well.  Patient was seen in  rounds and was ready to go home.   Diet: Regular diet Activity:WBAT Follow-up:in 10 days Disposition - Home Discharged Condition: good   Discharge Instructions     Call MD / Call 911   Complete by: As directed    If you experience chest pain or shortness of breath, CALL 911 and be transported to the hospital emergency room.  If you develope a fever above 101 F, pus (white drainage) or increased drainage or redness at the wound, or calf pain, call your surgeon's office.   Call MD / Call 911   Complete by: As directed    If you experience chest pain or shortness of breath, CALL 911 and be transported to the hospital emergency room.  If you develope a fever above 101 F, pus (white drainage) or increased drainage or redness at the wound, or calf pain, call your surgeon's office.   Constipation Prevention   Complete by: As directed    Drink plenty of fluids.  Prune juice may be helpful.  You may use a stool softener, such as Colace (over the counter) 100 mg twice a day.  Use MiraLax (over the counter) for constipation as needed.   Constipation Prevention   Complete by: As directed    Drink plenty of fluids.  Prune juice may be helpful.  You may use a stool softener, such as Colace (over the counter) 100 mg twice a day.  Use MiraLax (over the counter) for constipation as needed.   Diet - low sodium heart healthy   Complete by: As directed    Diet - low sodium heart healthy   Complete by: As directed    Increase activity slowly as tolerated   Complete by: As directed    Increase activity slowly as tolerated   Complete by: As directed    Post-operative opioid taper instructions:   Complete by: As directed    POST-OPERATIVE OPIOID TAPER INSTRUCTIONS: It is important to wean off of your opioid medication as soon as possible. If you do not need pain medication after your surgery it is ok to stop day one. Opioids include: Codeine, Hydrocodone(Norco, Vicodin), Oxycodone(Percocet, oxycontin)  and hydromorphone amongst others.  Long term and even short term use of opiods can cause: Increased pain response Dependence Constipation Depression Respiratory depression And more.  Withdrawal symptoms can include Flu like symptoms Nausea, vomiting And more Techniques to manage these symptoms Hydrate well Eat regular healthy meals Stay active Use relaxation techniques(deep breathing, meditating, yoga) Do Not substitute Alcohol to help with tapering If you have been on opioids for less than two weeks and do not have pain than it is ok to stop all together.  Plan to wean off of opioids This plan should start within one week post op of your joint replacement. Maintain the same interval or time between taking each dose and first decrease the dose.  Cut the total daily intake of opioids by one tablet each day Next start to increase the time between doses. The last dose that should be eliminated is the evening dose.      Post-operative opioid taper instructions:   Complete by: As directed    POST-OPERATIVE OPIOID TAPER INSTRUCTIONS: It is important to wean off of your opioid medication  as soon as possible. If you do not need pain medication after your surgery it is ok to stop day one. Opioids include: Codeine, Hydrocodone(Norco, Vicodin), Oxycodone(Percocet, oxycontin) and hydromorphone amongst others.  Long term and even short term use of opiods can cause: Increased pain response Dependence Constipation Depression Respiratory depression And more.  Withdrawal symptoms can include Flu like symptoms Nausea, vomiting And more Techniques to manage these symptoms Hydrate well Eat regular healthy meals Stay active Use relaxation techniques(deep breathing, meditating, yoga) Do Not substitute Alcohol to help with tapering If you have been on opioids for less than two weeks and do not have pain than it is ok to stop all together.  Plan to wean off of opioids This plan should  start within one week post op of your joint replacement. Maintain the same interval or time between taking each dose and first decrease the dose.  Cut the total daily intake of opioids by one tablet each day Next start to increase the time between doses. The last dose that should be eliminated is the evening dose.         Allergies as of 05/02/2021       Reactions   Shellfish Allergy Hives, Swelling   Pt States betadine ok   Bee Venom Swelling   Crab Extract Allergy Skin Test Hives, Swelling        Medication List     STOP taking these medications    atorvastatin 20 MG tablet Commonly known as: LIPITOR   meloxicam 15 MG tablet Commonly known as: MOBIC       TAKE these medications    aspirin EC 81 MG tablet Take 1 tablet (81 mg total) by mouth 2 (two) times daily after a meal. Day after surgery   cholecalciferol 25 MCG (1000 UNIT) tablet Commonly known as: VITAMIN D3 Take 1,000 Units by mouth daily.   diclofenac Sodium 1 % Gel Commonly known as: VOLTAREN Apply 1 application topically daily as needed (pain).   docusate sodium 100 MG capsule Commonly known as: Colace Take 1 capsule (100 mg total) by mouth 2 (two) times daily as needed for mild constipation.   fluticasone 50 MCG/ACT nasal spray Commonly known as: FLONASE SPRAY TWO SPRAYS IN EACH NOSTRIL ONCE DAILY   lansoprazole 15 MG capsule Commonly known as: PREVACID TAKE 1 CAPSULE DAILY AT 12 NOON What changed: See the new instructions.   methocarbamol 500 MG tablet Commonly known as: Robaxin Take 1 tablet (500 mg total) by mouth every 8 (eight) hours as needed for muscle spasms.   oxyCODONE 5 MG immediate release tablet Commonly known as: Oxy IR/ROXICODONE Take 1 tablet (5 mg total) by mouth every 4 (four) hours as needed for severe pain.   polyethylene glycol 17 g packet Commonly known as: MIRALAX / GLYCOLAX Take 17 g by mouth daily.   valACYclovir 500 MG tablet Commonly known as:  VALTREX TAKE ONE TABLET BY MOUTH ONCE WEEKLY WHICH PREVENTS OUTBREAKS OF HSV What changed:  how much to take how to take this when to take this reasons to take this additional instructions        Follow-up Information     Susa Day, MD Follow up in 2 week(s).   Specialty: Orthopedic Surgery Contact information: [address] [place] [address] [phone]                 Signed: Lacie Draft, PA-C Orthopaedic Surgery 05/02/2021, 12:47 PM

## 2021-05-03 DIAGNOSIS — I10 Essential (primary) hypertension: Secondary | ICD-10-CM | POA: Diagnosis not present

## 2021-05-03 DIAGNOSIS — F419 Anxiety disorder, unspecified: Secondary | ICD-10-CM | POA: Diagnosis not present

## 2021-05-03 DIAGNOSIS — Z96652 Presence of left artificial knee joint: Secondary | ICD-10-CM | POA: Diagnosis not present

## 2021-05-03 DIAGNOSIS — Z471 Aftercare following joint replacement surgery: Secondary | ICD-10-CM | POA: Diagnosis not present

## 2021-05-03 DIAGNOSIS — M1711 Unilateral primary osteoarthritis, right knee: Secondary | ICD-10-CM | POA: Diagnosis not present

## 2021-05-03 DIAGNOSIS — E559 Vitamin D deficiency, unspecified: Secondary | ICD-10-CM | POA: Diagnosis not present

## 2021-05-03 DIAGNOSIS — M479 Spondylosis, unspecified: Secondary | ICD-10-CM | POA: Diagnosis not present

## 2021-05-03 DIAGNOSIS — K219 Gastro-esophageal reflux disease without esophagitis: Secondary | ICD-10-CM | POA: Diagnosis not present

## 2021-05-03 DIAGNOSIS — Z7982 Long term (current) use of aspirin: Secondary | ICD-10-CM | POA: Diagnosis not present

## 2021-05-03 DIAGNOSIS — Z79899 Other long term (current) drug therapy: Secondary | ICD-10-CM | POA: Diagnosis not present

## 2021-05-03 DIAGNOSIS — Z86718 Personal history of other venous thrombosis and embolism: Secondary | ICD-10-CM | POA: Diagnosis not present

## 2021-05-03 DIAGNOSIS — Z9181 History of falling: Secondary | ICD-10-CM | POA: Diagnosis not present

## 2021-05-03 DIAGNOSIS — E785 Hyperlipidemia, unspecified: Secondary | ICD-10-CM | POA: Diagnosis not present

## 2021-05-12 DIAGNOSIS — Z4789 Encounter for other orthopedic aftercare: Secondary | ICD-10-CM | POA: Diagnosis not present

## 2021-05-12 DIAGNOSIS — Z471 Aftercare following joint replacement surgery: Secondary | ICD-10-CM | POA: Diagnosis not present

## 2021-05-15 DIAGNOSIS — M1712 Unilateral primary osteoarthritis, left knee: Secondary | ICD-10-CM | POA: Diagnosis not present

## 2021-05-17 DIAGNOSIS — M1712 Unilateral primary osteoarthritis, left knee: Secondary | ICD-10-CM | POA: Diagnosis not present

## 2021-05-19 DIAGNOSIS — M1712 Unilateral primary osteoarthritis, left knee: Secondary | ICD-10-CM | POA: Diagnosis not present

## 2021-05-22 DIAGNOSIS — M1712 Unilateral primary osteoarthritis, left knee: Secondary | ICD-10-CM | POA: Diagnosis not present

## 2021-05-26 DIAGNOSIS — M1712 Unilateral primary osteoarthritis, left knee: Secondary | ICD-10-CM | POA: Diagnosis not present

## 2021-05-30 DIAGNOSIS — M1712 Unilateral primary osteoarthritis, left knee: Secondary | ICD-10-CM | POA: Diagnosis not present

## 2021-06-01 DIAGNOSIS — M1712 Unilateral primary osteoarthritis, left knee: Secondary | ICD-10-CM | POA: Diagnosis not present

## 2021-06-05 DIAGNOSIS — M1712 Unilateral primary osteoarthritis, left knee: Secondary | ICD-10-CM | POA: Diagnosis not present

## 2021-06-12 DIAGNOSIS — M1712 Unilateral primary osteoarthritis, left knee: Secondary | ICD-10-CM | POA: Diagnosis not present

## 2021-06-14 DIAGNOSIS — M1712 Unilateral primary osteoarthritis, left knee: Secondary | ICD-10-CM | POA: Diagnosis not present

## 2021-06-16 DIAGNOSIS — M1712 Unilateral primary osteoarthritis, left knee: Secondary | ICD-10-CM | POA: Diagnosis not present

## 2021-06-19 DIAGNOSIS — M1712 Unilateral primary osteoarthritis, left knee: Secondary | ICD-10-CM | POA: Diagnosis not present

## 2021-06-21 DIAGNOSIS — M1712 Unilateral primary osteoarthritis, left knee: Secondary | ICD-10-CM | POA: Diagnosis not present

## 2021-06-26 DIAGNOSIS — M1712 Unilateral primary osteoarthritis, left knee: Secondary | ICD-10-CM | POA: Diagnosis not present

## 2021-06-30 DIAGNOSIS — M1712 Unilateral primary osteoarthritis, left knee: Secondary | ICD-10-CM | POA: Diagnosis not present

## 2021-07-04 DIAGNOSIS — M1712 Unilateral primary osteoarthritis, left knee: Secondary | ICD-10-CM | POA: Diagnosis not present

## 2021-07-05 ENCOUNTER — Telehealth: Payer: Self-pay | Admitting: Internal Medicine

## 2021-07-05 NOTE — Telephone Encounter (Signed)
LVM that appt had been changed from 11:45 to 11:30 ?

## 2021-08-07 ENCOUNTER — Encounter: Payer: Self-pay | Admitting: Internal Medicine

## 2021-08-07 ENCOUNTER — Other Ambulatory Visit: Payer: Self-pay | Admitting: Internal Medicine

## 2021-08-07 DIAGNOSIS — M7918 Myalgia, other site: Secondary | ICD-10-CM

## 2021-08-14 MED ORDER — MELOXICAM 15 MG PO TABS: 15.0000 mg | ORAL_TABLET | Freq: Every day | ORAL | 0 refills | Status: DC

## 2021-08-24 ENCOUNTER — Other Ambulatory Visit: Payer: Medicare Other

## 2021-08-31 ENCOUNTER — Ambulatory Visit: Payer: Medicare Other | Admitting: Internal Medicine

## 2021-09-10 ENCOUNTER — Other Ambulatory Visit: Payer: Self-pay | Admitting: Internal Medicine

## 2021-09-19 DIAGNOSIS — E785 Hyperlipidemia, unspecified: Secondary | ICD-10-CM | POA: Diagnosis not present

## 2021-09-19 DIAGNOSIS — J302 Other seasonal allergic rhinitis: Secondary | ICD-10-CM | POA: Diagnosis not present

## 2021-09-19 DIAGNOSIS — K219 Gastro-esophageal reflux disease without esophagitis: Secondary | ICD-10-CM | POA: Diagnosis not present

## 2021-09-19 DIAGNOSIS — Z86718 Personal history of other venous thrombosis and embolism: Secondary | ICD-10-CM | POA: Diagnosis not present

## 2021-10-18 DIAGNOSIS — R635 Abnormal weight gain: Secondary | ICD-10-CM | POA: Diagnosis not present

## 2021-10-18 DIAGNOSIS — E559 Vitamin D deficiency, unspecified: Secondary | ICD-10-CM | POA: Diagnosis not present

## 2021-10-18 DIAGNOSIS — Z6830 Body mass index (BMI) 30.0-30.9, adult: Secondary | ICD-10-CM | POA: Diagnosis not present

## 2021-10-18 DIAGNOSIS — E785 Hyperlipidemia, unspecified: Secondary | ICD-10-CM | POA: Diagnosis not present

## 2021-10-18 DIAGNOSIS — N951 Menopausal and female climacteric states: Secondary | ICD-10-CM | POA: Diagnosis not present

## 2021-10-23 DIAGNOSIS — N951 Menopausal and female climacteric states: Secondary | ICD-10-CM | POA: Diagnosis not present

## 2021-10-23 DIAGNOSIS — Z6830 Body mass index (BMI) 30.0-30.9, adult: Secondary | ICD-10-CM | POA: Diagnosis not present

## 2021-10-23 DIAGNOSIS — K219 Gastro-esophageal reflux disease without esophagitis: Secondary | ICD-10-CM | POA: Diagnosis not present

## 2021-10-23 DIAGNOSIS — E785 Hyperlipidemia, unspecified: Secondary | ICD-10-CM | POA: Diagnosis not present

## 2021-10-31 DIAGNOSIS — Z23 Encounter for immunization: Secondary | ICD-10-CM | POA: Diagnosis not present

## 2021-10-31 DIAGNOSIS — R21 Rash and other nonspecific skin eruption: Secondary | ICD-10-CM | POA: Diagnosis not present

## 2021-11-03 DIAGNOSIS — M25561 Pain in right knee: Secondary | ICD-10-CM | POA: Diagnosis not present

## 2021-11-03 DIAGNOSIS — Z4789 Encounter for other orthopedic aftercare: Secondary | ICD-10-CM | POA: Diagnosis not present

## 2021-11-09 DIAGNOSIS — E785 Hyperlipidemia, unspecified: Secondary | ICD-10-CM | POA: Diagnosis not present

## 2021-11-09 DIAGNOSIS — Z6831 Body mass index (BMI) 31.0-31.9, adult: Secondary | ICD-10-CM | POA: Diagnosis not present

## 2021-11-23 DIAGNOSIS — N951 Menopausal and female climacteric states: Secondary | ICD-10-CM | POA: Diagnosis not present

## 2021-11-23 DIAGNOSIS — K219 Gastro-esophageal reflux disease without esophagitis: Secondary | ICD-10-CM | POA: Diagnosis not present

## 2021-11-23 DIAGNOSIS — Z6830 Body mass index (BMI) 30.0-30.9, adult: Secondary | ICD-10-CM | POA: Diagnosis not present

## 2021-12-11 DIAGNOSIS — L309 Dermatitis, unspecified: Secondary | ICD-10-CM | POA: Diagnosis not present

## 2021-12-11 DIAGNOSIS — R21 Rash and other nonspecific skin eruption: Secondary | ICD-10-CM | POA: Diagnosis not present

## 2021-12-12 DIAGNOSIS — Z1231 Encounter for screening mammogram for malignant neoplasm of breast: Secondary | ICD-10-CM | POA: Diagnosis not present

## 2022-01-02 DIAGNOSIS — M85852 Other specified disorders of bone density and structure, left thigh: Secondary | ICD-10-CM | POA: Diagnosis not present

## 2022-01-02 DIAGNOSIS — Z780 Asymptomatic menopausal state: Secondary | ICD-10-CM | POA: Diagnosis not present

## 2022-01-04 DIAGNOSIS — K219 Gastro-esophageal reflux disease without esophagitis: Secondary | ICD-10-CM | POA: Diagnosis not present

## 2022-01-04 DIAGNOSIS — Z6829 Body mass index (BMI) 29.0-29.9, adult: Secondary | ICD-10-CM | POA: Diagnosis not present

## 2022-01-18 ENCOUNTER — Other Ambulatory Visit: Payer: Self-pay | Admitting: Internal Medicine

## 2022-01-25 DIAGNOSIS — Z6829 Body mass index (BMI) 29.0-29.9, adult: Secondary | ICD-10-CM | POA: Diagnosis not present

## 2022-01-25 DIAGNOSIS — E785 Hyperlipidemia, unspecified: Secondary | ICD-10-CM | POA: Diagnosis not present

## 2022-02-02 DIAGNOSIS — E559 Vitamin D deficiency, unspecified: Secondary | ICD-10-CM | POA: Diagnosis not present

## 2022-02-02 DIAGNOSIS — E663 Overweight: Secondary | ICD-10-CM | POA: Diagnosis not present

## 2022-02-15 ENCOUNTER — Other Ambulatory Visit: Payer: Self-pay | Admitting: Internal Medicine

## 2022-05-22 DIAGNOSIS — J069 Acute upper respiratory infection, unspecified: Secondary | ICD-10-CM | POA: Diagnosis not present

## 2022-05-22 DIAGNOSIS — J209 Acute bronchitis, unspecified: Secondary | ICD-10-CM | POA: Diagnosis not present

## 2022-05-27 ENCOUNTER — Encounter (HOSPITAL_COMMUNITY): Payer: Self-pay

## 2022-05-27 ENCOUNTER — Inpatient Hospital Stay (HOSPITAL_COMMUNITY): Payer: Medicare Other

## 2022-05-27 ENCOUNTER — Emergency Department (HOSPITAL_COMMUNITY): Payer: Medicare Other

## 2022-05-27 ENCOUNTER — Encounter (HOSPITAL_COMMUNITY)
Admission: EM | Disposition: A | Discharge status: Home / Self Care | Payer: Self-pay | Source: Home / Self Care | Attending: Emergency Medicine

## 2022-05-27 ENCOUNTER — Other Ambulatory Visit: Payer: Self-pay

## 2022-05-27 ENCOUNTER — Inpatient Hospital Stay (HOSPITAL_COMMUNITY): Payer: Medicare Other | Admitting: Anesthesiology

## 2022-05-27 ENCOUNTER — Observation Stay (HOSPITAL_COMMUNITY)
Admission: EM | Admit: 2022-05-27 | Discharge: 2022-05-28 | Disposition: A | Discharge status: Home / Self Care | Payer: Medicare Other | Attending: Internal Medicine | Admitting: Internal Medicine

## 2022-05-27 DIAGNOSIS — F419 Anxiety disorder, unspecified: Secondary | ICD-10-CM | POA: Diagnosis not present

## 2022-05-27 DIAGNOSIS — F1090 Alcohol use, unspecified, uncomplicated: Secondary | ICD-10-CM | POA: Insufficient documentation

## 2022-05-27 DIAGNOSIS — I959 Hypotension, unspecified: Secondary | ICD-10-CM | POA: Insufficient documentation

## 2022-05-27 DIAGNOSIS — Y92838 Other recreation area as the place of occurrence of the external cause: Secondary | ICD-10-CM | POA: Diagnosis not present

## 2022-05-27 DIAGNOSIS — S72392A Other fracture of shaft of left femur, initial encounter for closed fracture: Secondary | ICD-10-CM | POA: Diagnosis not present

## 2022-05-27 DIAGNOSIS — M79605 Pain in left leg: Secondary | ICD-10-CM | POA: Diagnosis not present

## 2022-05-27 DIAGNOSIS — S7290XA Unspecified fracture of unspecified femur, initial encounter for closed fracture: Secondary | ICD-10-CM

## 2022-05-27 DIAGNOSIS — D62 Acute posthemorrhagic anemia: Secondary | ICD-10-CM | POA: Diagnosis not present

## 2022-05-27 DIAGNOSIS — S3993XA Unspecified injury of pelvis, initial encounter: Secondary | ICD-10-CM | POA: Diagnosis not present

## 2022-05-27 DIAGNOSIS — S7222XA Displaced subtrochanteric fracture of left femur, initial encounter for closed fracture: Secondary | ICD-10-CM | POA: Diagnosis not present

## 2022-05-27 DIAGNOSIS — E872 Acidosis, unspecified: Secondary | ICD-10-CM | POA: Insufficient documentation

## 2022-05-27 DIAGNOSIS — W19XXXA Unspecified fall, initial encounter: Principal | ICD-10-CM

## 2022-05-27 DIAGNOSIS — Z96652 Presence of left artificial knee joint: Secondary | ICD-10-CM | POA: Insufficient documentation

## 2022-05-27 DIAGNOSIS — S7292XA Unspecified fracture of left femur, initial encounter for closed fracture: Secondary | ICD-10-CM

## 2022-05-27 DIAGNOSIS — Z79899 Other long term (current) drug therapy: Secondary | ICD-10-CM | POA: Diagnosis not present

## 2022-05-27 DIAGNOSIS — S72002A Fracture of unspecified part of neck of left femur, initial encounter for closed fracture: Secondary | ICD-10-CM

## 2022-05-27 DIAGNOSIS — Z7982 Long term (current) use of aspirin: Secondary | ICD-10-CM | POA: Insufficient documentation

## 2022-05-27 DIAGNOSIS — M1990 Unspecified osteoarthritis, unspecified site: Secondary | ICD-10-CM

## 2022-05-27 DIAGNOSIS — E559 Vitamin D deficiency, unspecified: Secondary | ICD-10-CM | POA: Diagnosis not present

## 2022-05-27 DIAGNOSIS — W010XXA Fall on same level from slipping, tripping and stumbling without subsequent striking against object, initial encounter: Secondary | ICD-10-CM | POA: Diagnosis not present

## 2022-05-27 DIAGNOSIS — S72332A Displaced oblique fracture of shaft of left femur, initial encounter for closed fracture: Secondary | ICD-10-CM | POA: Diagnosis not present

## 2022-05-27 DIAGNOSIS — Z9889 Other specified postprocedural states: Secondary | ICD-10-CM | POA: Diagnosis not present

## 2022-05-27 DIAGNOSIS — T1490XA Injury, unspecified, initial encounter: Secondary | ICD-10-CM | POA: Diagnosis not present

## 2022-05-27 DIAGNOSIS — I10 Essential (primary) hypertension: Secondary | ICD-10-CM | POA: Diagnosis not present

## 2022-05-27 DIAGNOSIS — R000 Tachycardia, unspecified: Secondary | ICD-10-CM | POA: Diagnosis not present

## 2022-05-27 HISTORY — DX: COVID-19: U07.1

## 2022-05-27 HISTORY — PX: INTRAMEDULLARY (IM) NAIL INTERTROCHANTERIC: SHX5875

## 2022-05-27 LAB — COMPREHENSIVE METABOLIC PANEL
ALT: 24 U/L (ref 0–44)
AST: 29 U/L (ref 15–41)
Albumin: 3.7 g/dL (ref 3.5–5.0)
Alkaline Phosphatase: 69 U/L (ref 38–126)
Anion gap: 13 (ref 5–15)
BUN: 21 mg/dL (ref 8–23)
CO2: 17 mmol/L — ABNORMAL LOW (ref 22–32)
Calcium: 8.4 mg/dL — ABNORMAL LOW (ref 8.9–10.3)
Chloride: 105 mmol/L (ref 98–111)
Creatinine, Ser: 0.63 mg/dL (ref 0.44–1.00)
GFR, Estimated: >60 mL/min (ref >60)
Glucose, Bld: 94 mg/dL (ref 70–99)
Potassium: 3.8 mmol/L (ref 3.5–5.1)
Sodium: 135 mmol/L (ref 135–145)
Total Bilirubin: 0.4 mg/dL (ref 0.3–1.2)
Total Protein: 6.3 g/dL — ABNORMAL LOW (ref 6.5–8.1)

## 2022-05-27 LAB — I-STAT CHEM 8, ED
BUN: 23 mg/dL (ref 8–23)
Calcium, Ion: 0.85 mmol/L — CL (ref 1.15–1.40)
Chloride: 111 mmol/L (ref 98–111)
Creatinine, Ser: 0.7 mg/dL (ref 0.44–1.00)
Glucose, Bld: 88 mg/dL (ref 70–99)
HCT: 39 % (ref 36.0–46.0)
Hemoglobin: 13.3 g/dL (ref 12.0–15.0)
Potassium: 3.9 mmol/L (ref 3.5–5.1)
Sodium: 138 mmol/L (ref 135–145)
TCO2: 18 mmol/L — ABNORMAL LOW (ref 22–32)

## 2022-05-27 LAB — CBC
HCT: 40.2 % (ref 36.0–46.0)
HCT: 34.5 % — ABNORMAL LOW (ref 36.0–46.0)
Hemoglobin: 12.9 g/dL (ref 12.0–15.0)
Hemoglobin: 11 g/dL — ABNORMAL LOW (ref 12.0–15.0)
MCH: 30.7 pg (ref 26.0–34.0)
MCH: 30.1 pg (ref 26.0–34.0)
MCHC: 32.1 g/dL (ref 30.0–36.0)
MCHC: 31.9 g/dL (ref 30.0–36.0)
MCV: 95.7 fL (ref 80.0–100.0)
MCV: 94.5 fL (ref 80.0–100.0)
Platelets: 217 10*3/uL (ref 150–400)
Platelets: 207 10*3/uL (ref 150–400)
RBC: 4.2 MIL/uL (ref 3.87–5.11)
RBC: 3.65 MIL/uL — ABNORMAL LOW (ref 3.87–5.11)
RDW: 14.4 % (ref 11.5–15.5)
RDW: 14.3 % (ref 11.5–15.5)
WBC: 9.7 10*3/uL (ref 4.0–10.5)
WBC: 8 10*3/uL (ref 4.0–10.5)
nRBC: 0 % (ref 0.0–0.2)
nRBC: 0 % (ref 0.0–0.2)

## 2022-05-27 LAB — BASIC METABOLIC PANEL
Anion gap: 11 (ref 5–15)
BUN: 22 mg/dL (ref 8–23)
CO2: 21 mmol/L — ABNORMAL LOW (ref 22–32)
Calcium: 8.5 mg/dL — ABNORMAL LOW (ref 8.9–10.3)
Chloride: 107 mmol/L (ref 98–111)
Creatinine, Ser: 0.58 mg/dL (ref 0.44–1.00)
GFR, Estimated: >60 mL/min (ref >60)
Glucose, Bld: 88 mg/dL (ref 70–99)
Potassium: 4.2 mmol/L (ref 3.5–5.1)
Sodium: 139 mmol/L (ref 135–145)

## 2022-05-27 LAB — LACTIC ACID, PLASMA
Lactic Acid, Venous: 3.3 mmol/L (ref 0.5–1.9)
Lactic Acid, Venous: 1.6 mmol/L (ref 0.5–1.9)

## 2022-05-27 LAB — CREATININE, SERUM
Creatinine, Ser: 0.53 mg/dL (ref 0.44–1.00)
GFR, Estimated: >60 mL/min (ref >60)

## 2022-05-27 LAB — SAMPLE TO BLOOD BANK

## 2022-05-27 LAB — ETHANOL: Alcohol, Ethyl (B): 228 mg/dL — ABNORMAL HIGH (ref <10)

## 2022-05-27 LAB — PROTIME-INR
INR: 1 (ref 0.8–1.2)
Prothrombin Time: 12.7 seconds (ref 11.4–15.2)

## 2022-05-27 LAB — HIV ANTIBODY (ROUTINE TESTING W REFLEX): HIV Screen 4th Generation wRfx: NONREACTIVE

## 2022-05-27 SURGERY — FIXATION, FRACTURE, INTERTROCHANTERIC, WITH INTRAMEDULLARY ROD
Anesthesia: General | Laterality: Left

## 2022-05-27 MED ORDER — HYDRALAZINE HCL 10 MG PO TABS: 10.0000 mg | ORAL_TABLET | Freq: Four times a day (QID) | ORAL | Status: DC | PRN

## 2022-05-27 MED ORDER — METHOCARBAMOL 1000 MG/10ML IJ SOLN: 500.0000 mg | Freq: Four times a day (QID) | INTRAVENOUS | Status: DC | PRN

## 2022-05-27 MED ORDER — MIDAZOLAM HCL 2 MG/2ML IJ SOLN
INTRAMUSCULAR | Status: AC
  Filled 2022-05-27: qty 2

## 2022-05-27 MED ORDER — CEFAZOLIN SODIUM-DEXTROSE 2-4 GM/100ML-% IV SOLN
2.0000 g | INTRAVENOUS | Status: AC
  Administered 2022-05-27: 2 g via INTRAVENOUS

## 2022-05-27 MED ORDER — ONDANSETRON HCL 4 MG/2ML IJ SOLN
INTRAMUSCULAR | Status: DC | PRN
  Administered 2022-05-27: 4 mg via INTRAVENOUS

## 2022-05-27 MED ORDER — ORAL CARE MOUTH RINSE: 15.0000 mL | Freq: Once | OROMUCOSAL | Status: AC

## 2022-05-27 MED ORDER — LIDOCAINE 2% (20 MG/ML) 5 ML SYRINGE
INTRAMUSCULAR | Status: DC | PRN
  Administered 2022-05-27: 60 mg via INTRAVENOUS

## 2022-05-27 MED ORDER — HYDROMORPHONE HCL 1 MG/ML IJ SOLN
1.0000 mg | Freq: Once | INTRAMUSCULAR | Status: AC
  Administered 2022-05-27: 1 mg via INTRAVENOUS
  Filled 2022-05-27: qty 1

## 2022-05-27 MED ORDER — ROCURONIUM BROMIDE 10 MG/ML (PF) SYRINGE
PREFILLED_SYRINGE | INTRAVENOUS | Status: DC | PRN
  Administered 2022-05-27: 40 mg via INTRAVENOUS

## 2022-05-27 MED ORDER — ENOXAPARIN SODIUM 40 MG/0.4ML IJ SOSY
40.0000 mg | PREFILLED_SYRINGE | INTRAMUSCULAR | Status: DC
  Administered 2022-05-28: 40 mg via SUBCUTANEOUS
  Filled 2022-05-27: qty 0.4

## 2022-05-27 MED ORDER — ACETAMINOPHEN 10 MG/ML IV SOLN
INTRAVENOUS | Status: AC
  Filled 2022-05-27: qty 100

## 2022-05-27 MED ORDER — HYDROCODONE-ACETAMINOPHEN 7.5-325 MG PO TABS: 1.0000 | ORAL_TABLET | ORAL | Status: DC | PRN

## 2022-05-27 MED ORDER — LACTATED RINGERS IV BOLUS
1000.0000 mL | Freq: Once | INTRAVENOUS | Status: AC
  Administered 2022-05-27: 1000 mL via INTRAVENOUS

## 2022-05-27 MED ORDER — OXYCODONE HCL 5 MG/5ML PO SOLN
5.0000 mg | Freq: Once | ORAL | Status: DC | PRN
  Administered 2022-05-27: 5 mg via ORAL

## 2022-05-27 MED ORDER — MELOXICAM 7.5 MG PO TABS
15.0000 mg | ORAL_TABLET | Freq: Every day | ORAL | Status: DC
  Administered 2022-05-27 – 2022-05-28 (×2): 15 mg via ORAL
  Filled 2022-05-27 (×2): qty 2

## 2022-05-27 MED ORDER — LORAZEPAM 2 MG/ML IJ SOLN: 1.0000 mg | INTRAMUSCULAR | Status: DC | PRN

## 2022-05-27 MED ORDER — ADULT MULTIVITAMIN W/MINERALS CH
1.0000 | ORAL_TABLET | Freq: Every day | ORAL | Status: DC
  Administered 2022-05-28: 1 via ORAL
  Filled 2022-05-27: qty 1

## 2022-05-27 MED ORDER — CHLORHEXIDINE GLUCONATE 0.12 % MT SOLN: 15.0000 mL | Freq: Once | OROMUCOSAL | Status: AC

## 2022-05-27 MED ORDER — ACETAMINOPHEN 325 MG PO TABS
325.0000 mg | ORAL_TABLET | Freq: Four times a day (QID) | ORAL | Status: DC | PRN
  Administered 2022-05-28: 650 mg via ORAL
  Filled 2022-05-27: qty 2

## 2022-05-27 MED ORDER — HYDROMORPHONE HCL 1 MG/ML IJ SOLN
1.0000 mg | INTRAMUSCULAR | Status: DC | PRN
  Administered 2022-05-27 (×2): 1 mg via INTRAVENOUS
  Filled 2022-05-27 (×2): qty 1

## 2022-05-27 MED ORDER — TRANEXAMIC ACID-NACL 1000-0.7 MG/100ML-% IV SOLN
1000.0000 mg | Freq: Once | INTRAVENOUS | Status: AC
  Administered 2022-05-27: 1000 mg via INTRAVENOUS
  Filled 2022-05-27: qty 100

## 2022-05-27 MED ORDER — CEFAZOLIN SODIUM-DEXTROSE 2-4 GM/100ML-% IV SOLN
INTRAVENOUS | Status: AC
  Filled 2022-05-27: qty 100

## 2022-05-27 MED ORDER — 0.9 % SODIUM CHLORIDE (POUR BTL) OPTIME
TOPICAL | Status: DC | PRN
  Administered 2022-05-27: 200 mL

## 2022-05-27 MED ORDER — CEFAZOLIN SODIUM-DEXTROSE 2-4 GM/100ML-% IV SOLN
2.0000 g | Freq: Three times a day (TID) | INTRAVENOUS | Status: AC
  Administered 2022-05-27 – 2022-05-28 (×3): 2 g via INTRAVENOUS
  Filled 2022-05-27 (×3): qty 100

## 2022-05-27 MED ORDER — FENTANYL CITRATE (PF) 250 MCG/5ML IJ SOLN
INTRAMUSCULAR | Status: AC
  Filled 2022-05-27: qty 5

## 2022-05-27 MED ORDER — CHLORHEXIDINE GLUCONATE 0.12 % MT SOLN
OROMUCOSAL | Status: AC
  Administered 2022-05-27: 15 mL via OROMUCOSAL
  Filled 2022-05-27: qty 15

## 2022-05-27 MED ORDER — THIAMINE MONONITRATE 100 MG PO TABS
100.0000 mg | ORAL_TABLET | Freq: Every day | ORAL | Status: DC
  Administered 2022-05-28: 100 mg via ORAL
  Filled 2022-05-27: qty 1

## 2022-05-27 MED ORDER — FENTANYL CITRATE (PF) 100 MCG/2ML IJ SOLN: 25.0000 ug | INTRAMUSCULAR | Status: DC | PRN

## 2022-05-27 MED ORDER — ACETAMINOPHEN 10 MG/ML IV SOLN
1000.0000 mg | Freq: Once | INTRAVENOUS | Status: AC
  Administered 2022-05-27: 1000 mg via INTRAVENOUS

## 2022-05-27 MED ORDER — SUCCINYLCHOLINE CHLORIDE 200 MG/10ML IV SOSY
PREFILLED_SYRINGE | INTRAVENOUS | Status: DC | PRN
  Administered 2022-05-27: 120 mg via INTRAVENOUS

## 2022-05-27 MED ORDER — METHOCARBAMOL 500 MG PO TABS
500.0000 mg | ORAL_TABLET | Freq: Four times a day (QID) | ORAL | Status: DC | PRN
  Administered 2022-05-27 – 2022-05-28 (×3): 500 mg via ORAL
  Filled 2022-05-27 (×3): qty 1

## 2022-05-27 MED ORDER — VANCOMYCIN HCL 1000 MG IV SOLR
INTRAVENOUS | Status: AC
  Filled 2022-05-27: qty 20

## 2022-05-27 MED ORDER — DEXAMETHASONE SODIUM PHOSPHATE 10 MG/ML IJ SOLN
INTRAMUSCULAR | Status: DC | PRN
  Administered 2022-05-27: 10 mg via INTRAVENOUS

## 2022-05-27 MED ORDER — METOCLOPRAMIDE HCL 5 MG PO TABS: 5.0000 mg | ORAL_TABLET | Freq: Three times a day (TID) | ORAL | Status: DC | PRN

## 2022-05-27 MED ORDER — OXYCODONE HCL 5 MG PO TABS: 5.0000 mg | ORAL_TABLET | Freq: Once | ORAL | Status: DC | PRN

## 2022-05-27 MED ORDER — ALBUMIN HUMAN 5 % IV SOLN: INTRAVENOUS | Status: DC | PRN

## 2022-05-27 MED ORDER — MIDAZOLAM HCL 2 MG/2ML IJ SOLN
INTRAMUSCULAR | Status: DC | PRN
  Administered 2022-05-27: 1 mg via INTRAVENOUS

## 2022-05-27 MED ORDER — PROPOFOL 10 MG/ML IV BOLUS
INTRAVENOUS | Status: DC | PRN
  Administered 2022-05-27: 150 mg via INTRAVENOUS

## 2022-05-27 MED ORDER — SODIUM CHLORIDE 0.9 % IV SOLN: INTRAVENOUS | Status: DC

## 2022-05-27 MED ORDER — LORAZEPAM 1 MG PO TABS: 1.0000 mg | ORAL_TABLET | ORAL | Status: DC | PRN

## 2022-05-27 MED ORDER — FOLIC ACID 1 MG PO TABS
1.0000 mg | ORAL_TABLET | Freq: Every day | ORAL | Status: DC
  Administered 2022-05-28: 1 mg via ORAL
  Filled 2022-05-27: qty 1

## 2022-05-27 MED ORDER — HYDROCODONE-ACETAMINOPHEN 5-325 MG PO TABS
1.0000 | ORAL_TABLET | Freq: Four times a day (QID) | ORAL | Status: DC | PRN
  Administered 2022-05-27: 2 via ORAL
  Filled 2022-05-27: qty 2

## 2022-05-27 MED ORDER — ONDANSETRON HCL 4 MG PO TABS: 4.0000 mg | ORAL_TABLET | Freq: Four times a day (QID) | ORAL | Status: DC | PRN

## 2022-05-27 MED ORDER — HYDROCODONE-ACETAMINOPHEN 5-325 MG PO TABS
1.0000 | ORAL_TABLET | ORAL | Status: DC | PRN
  Administered 2022-05-28: 1 via ORAL
  Filled 2022-05-27: qty 2
  Filled 2022-05-27: qty 1

## 2022-05-27 MED ORDER — ACETAMINOPHEN 500 MG PO TABS: 1000.0000 mg | ORAL_TABLET | Freq: Once | ORAL | Status: DC

## 2022-05-27 MED ORDER — POLYETHYLENE GLYCOL 3350 17 G PO PACK: 17.0000 g | PACK | Freq: Every day | ORAL | Status: DC | PRN

## 2022-05-27 MED ORDER — ONDANSETRON HCL 4 MG/2ML IJ SOLN
4.0000 mg | Freq: Four times a day (QID) | INTRAMUSCULAR | Status: DC | PRN
  Administered 2022-05-27: 4 mg via INTRAVENOUS
  Filled 2022-05-27: qty 2

## 2022-05-27 MED ORDER — NALOXONE HCL 0.4 MG/ML IJ SOLN: 0.4000 mg | INTRAMUSCULAR | Status: DC | PRN

## 2022-05-27 MED ORDER — PROPOFOL 10 MG/ML IV BOLUS
INTRAVENOUS | Status: AC
  Filled 2022-05-27: qty 20

## 2022-05-27 MED ORDER — PHENYLEPHRINE 80 MCG/ML (10ML) SYRINGE FOR IV PUSH (FOR BLOOD PRESSURE SUPPORT)
PREFILLED_SYRINGE | INTRAVENOUS | Status: DC | PRN
  Administered 2022-05-27 (×2): 80 ug via INTRAVENOUS
  Administered 2022-05-27: 160 ug via INTRAVENOUS
  Administered 2022-05-27 (×2): 80 ug via INTRAVENOUS
  Administered 2022-05-27: 160 ug via INTRAVENOUS
  Administered 2022-05-27: 80 ug via INTRAVENOUS
  Administered 2022-05-27: 160 ug via INTRAVENOUS

## 2022-05-27 MED ORDER — THIAMINE HCL 100 MG/ML IJ SOLN: 100.0000 mg | Freq: Every day | INTRAMUSCULAR | Status: DC

## 2022-05-27 MED ORDER — LACTATED RINGERS IV SOLN: INTRAVENOUS | Status: DC

## 2022-05-27 MED ORDER — DOCUSATE SODIUM 100 MG PO CAPS
100.0000 mg | ORAL_CAPSULE | Freq: Two times a day (BID) | ORAL | Status: DC
  Administered 2022-05-28: 100 mg via ORAL
  Filled 2022-05-27 (×2): qty 1

## 2022-05-27 MED ORDER — SUGAMMADEX SODIUM 200 MG/2ML IV SOLN
INTRAVENOUS | Status: DC | PRN
  Administered 2022-05-27: 200 mg via INTRAVENOUS

## 2022-05-27 MED ORDER — FENTANYL CITRATE (PF) 250 MCG/5ML IJ SOLN
INTRAMUSCULAR | Status: DC | PRN
  Administered 2022-05-27: 150 ug via INTRAVENOUS

## 2022-05-27 MED ORDER — ONDANSETRON HCL 4 MG/2ML IJ SOLN: 4.0000 mg | Freq: Once | INTRAMUSCULAR | Status: DC | PRN

## 2022-05-27 MED ORDER — OXYCODONE HCL 5 MG/5ML PO SOLN
ORAL | Status: AC
  Filled 2022-05-27: qty 5

## 2022-05-27 MED ORDER — METOCLOPRAMIDE HCL 5 MG/ML IJ SOLN
5.0000 mg | Freq: Three times a day (TID) | INTRAMUSCULAR | Status: DC | PRN
  Administered 2022-05-27: 10 mg via INTRAVENOUS
  Filled 2022-05-27: qty 2

## 2022-05-27 SURGICAL SUPPLY — 48 items
ADH SKN CLS APL DERMABOND .7 (GAUZE/BANDAGES/DRESSINGS) ×1
APL PRP STRL LF DISP 70% ISPRP (MISCELLANEOUS) ×1
BAG COUNTER SPONGE SURGICOUNT (BAG) IMPLANT
BAG SPNG CNTER NS LX DISP (BAG) ×1
BIT DRILL INTERTAN LAG SCREW (BIT) IMPLANT
BIT DRILL SHORT 4.0 (BIT) IMPLANT
BRUSH SCRUB EZ PLAIN DRY (MISCELLANEOUS) ×4 IMPLANT
CHLORAPREP W/TINT 26 (MISCELLANEOUS) ×2 IMPLANT
COVER MAYO STAND STRL (DRAPES) IMPLANT
COVER PERINEAL POST (MISCELLANEOUS) ×2 IMPLANT
COVER SURGICAL LIGHT HANDLE (MISCELLANEOUS) ×2 IMPLANT
DERMABOND ADVANCED .7 DNX12 (GAUZE/BANDAGES/DRESSINGS) ×2 IMPLANT
DRAPE C-ARM 35X43 STRL (DRAPES) ×2 IMPLANT
DRAPE IMP U-DRAPE 54X76 (DRAPES) ×4 IMPLANT
DRAPE STERI IOBAN 125X83 (DRAPES) ×2 IMPLANT
DRAPE SURG 17X23 STRL (DRAPES) ×4 IMPLANT
DRAPE U-SHAPE 47X51 STRL (DRAPES) ×2 IMPLANT
DRESSING MEPILEX FLEX 4X4 (GAUZE/BANDAGES/DRESSINGS) IMPLANT
DRILL BIT SHORT 4.0 (BIT) ×1
DRSG MEPILEX BORDER 4X4 (GAUZE/BANDAGES/DRESSINGS) ×2 IMPLANT
DRSG MEPILEX BORDER 4X8 (GAUZE/BANDAGES/DRESSINGS) ×2 IMPLANT
DRSG MEPILEX FLEX 4X4 (GAUZE/BANDAGES/DRESSINGS) ×3
GLOVE BIO SURGEON STRL SZ 6.5 (GLOVE) ×6 IMPLANT
GLOVE BIO SURGEON STRL SZ7.5 (GLOVE) ×8 IMPLANT
GLOVE BIOGEL PI IND STRL 6.5 (GLOVE) ×2 IMPLANT
GLOVE BIOGEL PI IND STRL 7.5 (GLOVE) ×2 IMPLANT
GOWN STRL REUS W/ TWL LRG LVL3 (GOWN DISPOSABLE) ×2 IMPLANT
GOWN STRL REUS W/TWL LRG LVL3 (GOWN DISPOSABLE) ×3
GUIDE PIN 3.2X343 (PIN) ×2
GUIDE PIN 3.2X343MM (PIN) ×2
GUIDE ROD 3.0 (MISCELLANEOUS) ×1
KIT BASIN OR (CUSTOM PROCEDURE TRAY) ×2 IMPLANT
KIT TURNOVER KIT B (KITS) ×2 IMPLANT
NAIL LOCK CANN 10X380 130D LT (Nail) IMPLANT
NS IRRIG 1000ML POUR BTL (IV SOLUTION) ×2 IMPLANT
PACK GENERAL/GYN (CUSTOM PROCEDURE TRAY) ×2 IMPLANT
PAD ARMBOARD 7.5X6 YLW CONV (MISCELLANEOUS) ×4 IMPLANT
PIN GUIDE 3.2X343MM (PIN) IMPLANT
ROD GUIDE 3.0 (MISCELLANEOUS) IMPLANT
SCREW LAG COMPR KIT 95/90 (Screw) IMPLANT
SCREW TRIGEN LOW PROF 5.0X42.5 (Screw) IMPLANT
SUT MNCRL AB 3-0 PS2 18 (SUTURE) ×2 IMPLANT
SUT VIC AB 0 CT1 27 (SUTURE) ×1
SUT VIC AB 0 CT1 27XBRD ANBCTR (SUTURE) IMPLANT
SUT VIC AB 2-0 CT1 27 (SUTURE) ×1
SUT VIC AB 2-0 CT1 TAPERPNT 27 (SUTURE) ×4 IMPLANT
TOWEL GREEN STERILE (TOWEL DISPOSABLE) ×4 IMPLANT
WATER STERILE IRR 1000ML POUR (IV SOLUTION) ×2 IMPLANT

## 2022-05-27 NOTE — Anesthesia Postprocedure Evaluation (Signed)
Anesthesia Post Note  Patient: Julia Murray  Procedure(s) Performed: INTRAMEDULLARY (IM) NAIL INTERTROCHANTERIC (Left)     Patient location during evaluation: PACU Anesthesia Type: General Level of consciousness: awake and alert Pain management: pain level controlled Vital Signs Assessment: post-procedure vital signs reviewed and stable Respiratory status: spontaneous breathing, nonlabored ventilation and respiratory function stable Cardiovascular status: stable and blood pressure returned to baseline Anesthetic complications: no   No notable events documented.  Last Vitals:  Vitals:   05/27/22 1300 05/27/22 1320  BP: 123/68 124/66  Pulse: 80 79  Resp: 13 14  Temp: 36.7 C   SpO2: 98% 96%    Last Pain:  Vitals:   05/27/22 1320  TempSrc:   PainSc: [place]

## 2022-05-27 NOTE — Op Note (Signed)
Orthopaedic Surgery Operative Note (CSN: [id number] ) Date of Surgery: 05/27/2022  Admit Date: 05/27/2022   Diagnoses: Pre-Op Diagnoses: Left subtrochanteric femur fracture  Post-Op Diagnosis: Same  Procedures: CPT 27506-Cephalomedullary nailing of left subtrochanteric femur fracture  Surgeons : Primary: Shona Needles, MD  Assistant: Patrecia Pace, PA-C  Location: OR 6   Anesthesia: General   Antibiotics: Ancef 2g preop   Tourniquet time: None    Estimated Blood KGMW:102 mL  Complications: None   Specimens:None   Implants: Implant Name Type Inv. Item Serial No. Manufacturer Lot No. LRB No. Used Action  NAIL LOCK CANN 10X380 130D LT - [id number] Nail NAIL LOCK CANN 10X380 130D LT  [employer] [id number] Left 1 Implanted  SCREW LAG COMPR KIT 95/90 - [id number] Screw SCREW LAG COMPR KIT 95/90  [employer] [id number] Left 1 Implanted  SCREW TRIGEN LOW PROF 5.0X42.5 - [id number] Screw SCREW TRIGEN LOW PROF 5.0X42.5  [employer] [id number] Left 1 Implanted     Indications for Surgery: 67 year old female who sustained a ground-level fall and had a left subtrochanteric femur fracture.  Due to the unstable nature of her injury I recommended proceeding with intramedullary nailing of her left femur.  Risks and benefits were discussed with the patient.  Risks included but not limited to bleeding, infection, malunion, nonunion, hardware failure, hardware irritation, nerve or blood vessel injury, DVT, even the possibility anesthetic complications.  She agreed to proceed with surgery and consent was obtained.  Operative Findings: Cephalomedullary nailing of left subtrochanteric femur fracture using [employer] 10 x 380 mm InterTAN nail with a 95 mm lag screw and 90 mm compression screw.  Procedure: The patient was identified in the preoperative holding area. Consent was confirmed with the patient and their family and all questions were  answered. The operative extremity was marked after confirmation with the patient. she was then brought back to the operating room by our anesthesia colleagues.  She was placed under general anesthetic and carefully transferred over to the [facility] table.  All bony prominences were well-padded.  The left lower extremity was then positioned and traction was applied to align the fracture.  The left lower extremity was then prepped and draped in usual sterile fashion.  A timeout was performed to verify the patient, the procedure, and the extremity.  Preoperative antibiotics were dosed.  I for started out by making a small incision at the femoral shaft component of the subtrochanteric femur fracture.  I used a bone hook to grasp the proximal fragment to align the fracture appropriately.  I then made a proximal incision to the greater trochanter and split the gluteal fascia in line with my incision.  I then directed a threaded guidewire at the tip of the greater trochanter and advanced it into the proximal metaphysis.  I then used an entry reamer to enter the medullary canal.  I then passed a ball-tipped guidewire down the center of the canal crossing the fracture site and into the distal shaft to the distal metaphysis.  I seated it into the distal metaphysis and measured the length and chose to use a 380 mm nail.  I sequentially reamed from 8 mm to 11.5 mm and obtained excellent chatter and decided to place a 10 mm nail.  The 10 x 380 mm nail was then placed down the center of the canal.  Fracture was aligned appropriately and using the targeting arm I then directed a threaded guidewire into the head/neck  segment.  I confirmed adequate tip apex distance with fluoroscopic imaging.  I then measured the length and decided to place a 95 mm lag screw.  The path for the compression screw was drilled and a antirotation bar was placed.  I then drilled the path for the lag screw and then placed the 95 mm lag screw.  I then placed  the compression screw and was able to compress approximately 4 to 5 mm.  The nail was then statically locked.  Perfect circle technique was then used to place a lateral to medial distal interlocking screw.  I then obtained final fluoroscopic imaging.  The incisions were copiously irrigated.  A layered closure of 2-0 Vicryl, 3-0 Monocryl Dermabond was used to close the skin.  Sterile dressings were applied.  The patient was then awoke from anesthesia and taken to the PACU in stable condition.  Post Op Plan/Instructions: The patient will be weightbearing as tolerated to the left lower extremity.  She will receive postoperative Ancef.  She will receive Lovenox for DVT prophylaxis and discharged on oral DOAC.  We will have her mobilize with physical and Occupational Therapy.  I was present and performed the entire surgery.  Patrecia Pace, PA-C did assist me throughout the case. An assistant was necessary given the difficulty in approach, maintenance of reduction and ability to instrument the fracture.   Katha Hamming, MD [facility]

## 2022-05-27 NOTE — Progress Notes (Addendum)
Orthopedic Tech Progress Note Patient Details:  Julia Murray Nov 13, 1955 [id number]  Pt transferred to hospital bed where 15lbs of bucks traction was applied to her LLE. All needs met at this time and her call bell is within reach.  Musculoskeletal Traction Type of Traction: Bucks Skin Traction Traction Location: LLE Traction Weight: 15 lbs   Post Interventions Patient Tolerated: Well Instructions Provided: Care of device  [name] Jeri Modena 05/27/2022, 9:29 AM

## 2022-05-27 NOTE — ED Provider Notes (Signed)
[facility] Emergency Department Provider Note MRN:  [id number]  Arrival date & time: 05/27/22     Chief Complaint   Leg Injury and Fall   History of Present Illness   Julia Murray is a 67 y.o. year-old female with no pertinent past medical history presenting to the ED with chief complaint of leg pain.  Patient was out with her friends this evening and came home and was trying to get into bed and fell off the bed.  She has severe pain to her left thigh region.  EMS concerned about gross deformity placed in a brace in the field.  Denies head trauma, no chest pain or shortness of breath, no neck or back pain, no abdominal pain, no other injuries or complaints.  Denies blood thinners.  Review of Systems  A thorough review of systems was obtained and all systems are negative except as noted in the HPI and PMH.   Patient's Health History    Past Medical History:  Diagnosis Date   Arthritis    knee, back   GERD (gastroesophageal reflux disease)    Hyperlipidemia     Past Surgical History:  Procedure Laterality Date   ANTERIOR CRUCIATE LIGAMENT REPAIR     left   AUGMENTATION MAMMAPLASTY Bilateral 2021   re-do   BREAST BIOPSY Bilateral    multiple times per patient   BREAST SURGERY     mutiple bx   COLONOSCOPY  04/04/2008   at [facility] done   KNEE ARTHROSCOPY  05/20/2012   Procedure: ARTHROSCOPY KNEE;  Surgeon: Johnn Hai, MD;  Location: [facility];  Service: Orthopedics;  Laterality: Right;  right knee scope with incision and drainage and removal of loose foreign body    KNEE ARTHROSCOPY WITH MEDIAL MENISECTOMY  04/25/2012   Procedure: KNEE ARTHROSCOPY WITH MEDIAL MENISECTOMY;  Surgeon: Johnn Hai, MD;  Location: [place];  Service: Orthopedics;  Laterality: Right;  Right Knee Arthrscopy with Partial Medial and Lateral Menisectomy with Removal of Loose Bodies   TONSILLECTOMY     TOTAL KNEE ARTHROPLASTY  Left 04/28/2021   Procedure: TOTAL KNEE ARTHROPLASTY, REMOVAL OF HARDWARE (ACL SCREWS);  Surgeon: Susa Day, MD;  Location: [facility];  Service: Orthopedics;  Laterality: Left;    Family History  Problem Relation Age of Onset   Heart disease Father    Heart attack Father    Diabetes Mother    Colon cancer Neg Hx    Esophageal cancer Neg Hx    Rectal cancer Neg Hx    Stomach cancer Neg Hx    Colon polyps Neg Hx     Social History   Socioeconomic History   Marital status: Married    Spouse name: Not on file   Number of children: Not on file   Years of education: Not on file   Highest education level: Not on file  Occupational History   Not on file  Tobacco Use   Smoking status: Never   Smokeless tobacco: Never  Vaping Use   Vaping Use: Never used  Substance and Sexual Activity   Alcohol use: Yes    Alcohol/week: 8.0 standard drinks of alcohol    Types: 8 Standard drinks or equivalent per week   Drug use: No   Sexual activity: Not on file  Other Topics Concern   Not on file  Social History Narrative   Not on file   Social Determinants of Health   Financial Resource  Strain: Not on file  Food Insecurity: Not on file  Transportation Needs: Not on file  Physical Activity: Not on file  Stress: Not on file  Social Connections: Not on file  Intimate Partner Violence: Not on file     Physical Exam   Vitals:   05/27/22 0430 05/27/22 0445  BP: 105/69 107/66  Pulse: 76 81  Resp: 11   Temp:    SpO2: 94% 93%    CONSTITUTIONAL: Well-appearing, NAD NEURO/PSYCH:  Alert and oriented x 3, no focal deficits EYES:  eyes equal and reactive ENT/NECK:  no LAD, no JVD CARDIO: Regular rate, well-perfused, normal S1 and S2 PULM:  CTAB no wheezing or rhonchi GI/GU:  non-distended, non-tender MSK/SPINE: Swelling and tenderness to the left thigh, neurovascularly intact distally SKIN:  no rash, atraumatic   *Additional and/or pertinent findings included in MDM  below  Diagnostic and Interventional Summary    EKG Interpretation  Date/Time:  Sunday May 27 2022 01:35:57 EST Ventricular Rate:  70 PR Interval:  55 QRS Duration: 80 QT Interval:  427 QTC Calculation: 461 R Axis:   58 Text Interpretation: Sinus rhythm Short PR interval Left atrial enlargement Confirmed by Gerlene Fee [phone]) on 05/27/2022 4:38:03 AM       Labs Reviewed  COMPREHENSIVE METABOLIC PANEL - Abnormal; Notable for the following components:      Result Value   CO2 17 (*)    Calcium 8.4 (*)    Total Protein 6.3 (*)    All other components within normal limits  ETHANOL - Abnormal; Notable for the following components:   Alcohol, Ethyl (B) 228 (*)    All other components within normal limits  LACTIC ACID, PLASMA - Abnormal; Notable for the following components:   Lactic Acid, Venous 3.3 (*)    All other components within normal limits  I-STAT CHEM 8, ED - Abnormal; Notable for the following components:   Calcium, Ion 0.85 (*)    TCO2 18 (*)    All other components within normal limits  CBC  PROTIME-INR  URINALYSIS, ROUTINE W REFLEX MICROSCOPIC  SAMPLE TO BLOOD BANK    DG Pelvis Portable  Final Result    DG Femur Min 2 Views Left  Final Result    DG Tibia/Fibula Left  Final Result    DG Chest Port 1 View  Final Result      Medications  lactated ringers bolus 1,000 mL (has no administration in time range)  HYDROmorphone (DILAUDID) injection 1 mg (1 mg Intravenous Given 05/27/22 0230)  HYDROmorphone (DILAUDID) injection 1 mg (1 mg Intravenous Given 05/27/22 0250)  HYDROmorphone (DILAUDID) injection 1 mg (1 mg Intravenous Given 05/27/22 0438)     Procedures  /  Critical Care Procedures  ED Course and Medical Decision Making  Initial Impression and Ddx Suspicion for femur fracture.  Some soft blood pressures in route, received 500 cc crystalloid, fentanyl as well.  Normotensive here with strong DP pulses bilaterally, awaiting x-rays.  Past  medical/surgical history that increases complexity of ED encounter: Hypertension  Interpretation of Diagnostics I personally reviewed the hip x-ray and my interpretation is as follows: Proximal femur fracture    Patient Reassessment and Ultimate Disposition/Management     Case discussed with Dr. Lyla Glassing who recommends that Dr. Doreatha Martin take this case given the location and morphology of fracture.  Dr. Doreatha Martin recommends n.p.o. and repair later today.  Admitted to hospital service for further care.  Patient management required discussion with the following services or consulting  groups:  Hospitalist Service and Orthopedic Surgery  Complexity of Problems Addressed Acute illness or injury that poses threat of life of bodily function  Additional Data Reviewed and Analyzed Further history obtained from: Further history from spouse/family member  Additional Factors Impacting ED Encounter Risk Use of parenteral controlled substances and Consideration of hospitalization  Barth Kirks. [place] Small, [place] [email or web address]  Final Clinical Impressions(s) / ED Diagnoses     ICD-10-CM   1. Fall, initial encounter  W19.XXXA     2. Pain of left lower extremity  M79.605       ED Discharge Orders     None        Discharge Instructions Discussed with and Provided to Patient:   Discharge Instructions   None      Maudie Flakes, MD 05/27/22 [phone]

## 2022-05-27 NOTE — Transfer of Care (Signed)
Immediate Anesthesia Transfer of Care Note  Patient: Julia Murray  Procedure(s) Performed: INTRAMEDULLARY (IM) NAIL INTERTROCHANTERIC (Left)  Patient Location: PACU  Anesthesia Type:General  Level of Consciousness: awake, alert , and oriented  Airway & Oxygen Therapy: Patient Spontanous Breathing and Patient connected to nasal cannula oxygen  Post-op Assessment: Report given to RN, Post -op Vital signs reviewed and stable, and Patient moving all extremities X 4  Post vital signs: Reviewed and stable  Last Vitals:  Vitals Value Taken Time  BP 121/66 05/27/22 1215  Temp 36.7 C 05/27/22 1215  Pulse 91 05/27/22 1227  Resp 13 05/27/22 1227  SpO2 100 % 05/27/22 1227  Vitals shown include unvalidated device data.  Last Pain:  Vitals:   05/27/22 1215  TempSrc:   PainSc: Asleep         Complications: No notable events documented.

## 2022-05-27 NOTE — ED Notes (Signed)
EDP Bero notified of critical lactic acid.

## 2022-05-27 NOTE — Progress Notes (Signed)
CSW added SUD resources, RNCM will follow up with the patient in regards to HH/DM. TOC will follow DC orders.

## 2022-05-27 NOTE — H&P (Signed)
History and Physical    Julia Murray QAS:[id number] DOB: [birth year] DOA: 05/27/2022  PCP: Bridget Hartshorn, NP  Patient coming from: Home  Chief Complaint: Left leg pain  HPI: Julia Murray is a 67 y.o. female with medical history significant of GERD, hyperlipidemia, alcohol use disorder presented to ED complaining of left leg pain and deformity after a mechanical fall.  Blood pressure initially 82/40 with EMS and was given a 500 mL bolus after which SBP improved to 120s.  Labs notable for bicarb 17, glucose 94, blood ethanol level 228, lactic acid 3.3.  Chest x-ray showing no active disease.  X-ray showing displaced and angulated fracture of the proximal left femoral diaphysis. ED physician consulted orthopedics (Dr. Doreatha Martin), recommended keeping n.p.o. and planning on taking her to the OR sometime today.  Patient was given doses of Dilaudid for pain and 1 L IV fluid bolus ordered.  TRH called to admit.  Patient states she had some drinks tonight and as she was trying to get into bed, she slipped and fell.  Since then she is having severe pain in her left hip/upper thigh region.  She was not able to get up from the floor.  Denies hitting her head or losing consciousness.  She takes aspirin at home but no other blood thinners.  She reports drinking alcohol about 3 times a week, drinks vodka.  She had some nasal/chest congestion for which her PCP gave her Mucinex and symptoms improved.  She has no other complaints at this time.  Denies fevers, cough, shortness of breath, chest pain, nausea, vomiting, abdominal pain, diarrhea, dysuria, or urinary frequency/urgency.  review of Systems:  Review of Systems  All other systems reviewed and are negative.   Past Medical History:  Diagnosis Date   Arthritis    knee, back   GERD (gastroesophageal reflux disease)    Hyperlipidemia     Past Surgical History:  Procedure Laterality Date   ANTERIOR CRUCIATE LIGAMENT REPAIR     left    AUGMENTATION MAMMAPLASTY Bilateral 2021   re-do   BREAST BIOPSY Bilateral    multiple times per patient   BREAST SURGERY     mutiple bx   COLONOSCOPY  04/04/2008   at [facility] done   KNEE ARTHROSCOPY  05/20/2012   Procedure: ARTHROSCOPY KNEE;  Surgeon: Johnn Hai, MD;  Location: [facility];  Service: Orthopedics;  Laterality: Right;  right knee scope with incision and drainage and removal of loose foreign body    KNEE ARTHROSCOPY WITH MEDIAL MENISECTOMY  04/25/2012   Procedure: KNEE ARTHROSCOPY WITH MEDIAL MENISECTOMY;  Surgeon: Johnn Hai, MD;  Location: [place];  Service: Orthopedics;  Laterality: Right;  Right Knee Arthrscopy with Partial Medial and Lateral Menisectomy with Removal of Loose Bodies   TONSILLECTOMY     TOTAL KNEE ARTHROPLASTY Left 04/28/2021   Procedure: TOTAL KNEE ARTHROPLASTY, REMOVAL OF HARDWARE (ACL SCREWS);  Surgeon: Susa Day, MD;  Location: [facility];  Service: Orthopedics;  Laterality: Left;     reports that she has never smoked. She has never used smokeless tobacco. She reports current alcohol use of about 8.0 standard drinks of alcohol per week. She reports that she does not use drugs.  Allergies  Allergen Reactions   Shellfish Allergy Hives and Swelling    Pt States betadine ok   Bee Venom Swelling   Crab Extract Allergy Skin Test Hives and Swelling    Family History  Problem Relation Age  of Onset   Heart disease Father    Heart attack Father    Diabetes Mother    Colon cancer Neg Hx    Esophageal cancer Neg Hx    Rectal cancer Neg Hx    Stomach cancer Neg Hx    Colon polyps Neg Hx     Prior to Admission medications   Medication Sig Start Date End Date Taking? Authorizing Provider  aspirin EC 81 MG tablet Take 1 tablet (81 mg total) by mouth 2 (two) times daily after a meal. Day after surgery Patient taking differently: Take 81 mg by mouth daily. 04/28/21  Yes Susa Day, MD  diclofenac  Sodium (VOLTAREN) 1 % GEL Apply 1 application topically daily as needed (pain).   Yes [provider]  fluticasone (FLONASE) 50 MCG/ACT nasal spray SPRAY TWO SPRAYS IN EACH NOSTRIL ONCE DAILY Patient taking differently: Place 2 sprays into both nostrils daily. 12/12/20  Yes Baxley, Cresenciano Lick, MD  lansoprazole (PREVACID) 15 MG capsule TAKE 1 CAPSULE DAILY AT 12 NOON Patient taking differently: Take 15 mg by mouth daily as needed (heartburn). 12/17/17  Yes Baxley, Cresenciano Lick, MD  meloxicam (MOBIC) 15 MG tablet Take 1 tablet (15 mg total) by mouth daily. 08/14/21  Yes Baxley, Cresenciano Lick, MD  Multiple Vitamins-Minerals (MULTIVITAMIN WITH MINERALS) tablet Take 1 tablet by mouth daily.   Yes [provider]  valACYclovir (VALTREX) 500 MG tablet TAKE ONE TABLET BY MOUTH ONCE WEEKLY WHICH PREVENTS OUTBREAKS OF HSV Patient taking differently: Take 500 mg by mouth daily as needed (fever blisters). 03/13/21  Yes Elby Showers, MD    Physical Exam: Vitals:   05/27/22 0345 05/27/22 0415 05/27/22 0430 05/27/22 0445  BP: 107/69 108/65 105/69 107/66  Pulse:  77 76 81  Resp: '11 11 11   '$ Temp:      SpO2:  100% 94% 93%  Weight:      Height:        Physical Exam Vitals reviewed.  Constitutional:      General: She is not in acute distress. HENT:     Head: Normocephalic and atraumatic.  Eyes:     Extraocular Movements: Extraocular movements intact.  Cardiovascular:     Rate and Rhythm: Normal rate and regular rhythm.     Pulses: Normal pulses.  Pulmonary:     Effort: Pulmonary effort is normal. No respiratory distress.     Breath sounds: Normal breath sounds. No wheezing or rales.  Abdominal:     General: Bowel sounds are normal. There is no distension.     Palpations: Abdomen is soft.     Tenderness: There is no abdominal tenderness.  Musculoskeletal:     Cervical back: Normal range of motion.     Right lower leg: No edema.     Left lower leg: No edema.     Comments: Left lower extremity  shortened and externally rotated.  Dorsalis pedis pulse intact.  Skin:    General: Skin is warm and dry.  Neurological:     General: No focal deficit present.     Mental Status: She is alert and oriented to person, place, and time.     Labs on Admission: I have personally reviewed following labs and imaging studies  CBC: Recent Labs  Lab 05/27/22 0136 05/27/22 0212  WBC 9.7  --   HGB 12.9 13.3  HCT 40.2 39.0  MCV 95.7  --   PLT 217  --    Basic Metabolic Panel: Recent  Labs  Lab 05/27/22 0136 05/27/22 0212  NA 135 138  K 3.8 3.9  CL 105 111  CO2 17*  --   GLUCOSE 94 88  BUN 21 23  CREATININE 0.63 0.70  CALCIUM 8.4*  --    GFR: Estimated Creatinine Clearance: 73.5 mL/min (by C-G formula based on SCr of 0.7 mg/dL). Liver Function Tests: Recent Labs  Lab 05/27/22 0136  AST 29  ALT 24  ALKPHOS 69  BILITOT 0.4  PROT 6.3*  ALBUMIN 3.7   No results for input(s): "LIPASE", "AMYLASE" in the last 168 hours. No results for input(s): "AMMONIA" in the last 168 hours. Coagulation Profile: Recent Labs  Lab 05/27/22 0136  INR 1.0   Cardiac Enzymes: No results for input(s): "CKTOTAL", "CKMB", "CKMBINDEX", "TROPONINI" in the last 168 hours. BNP (last 3 results) No results for input(s): "PROBNP" in the last 8760 hours. HbA1C: No results for input(s): "HGBA1C" in the last 72 hours. CBG: No results for input(s): "GLUCAP" in the last 168 hours. Lipid Profile: No results for input(s): "CHOL", "HDL", "LDLCALC", "TRIG", "CHOLHDL", "LDLDIRECT" in the last 72 hours. Thyroid Function Tests: No results for input(s): "TSH", "T4TOTAL", "FREET4", "T3FREE", "THYROIDAB" in the last 72 hours. Anemia Panel: No results for input(s): "VITAMINB12", "FOLATE", "FERRITIN", "TIBC", "IRON", "RETICCTPCT" in the last 72 hours. Urine analysis:    Component Value Date/Time   COLORURINE STRAW (A) 04/18/2021 0837   APPEARANCEUR CLEAR 04/18/2021 0837   LABSPEC 1.017 04/18/2021 0837   PHURINE  7.0 04/18/2021 0837   GLUCOSEU NEGATIVE 04/18/2021 0837   HGBUR NEGATIVE 04/18/2021 0837   BILIRUBINUR NEGATIVE 04/18/2021 0837   BILIRUBINUR neg 02/28/2021 [place] 04/18/2021 0837   PROTEINUR NEGATIVE 04/18/2021 0837   UROBILINOGEN 0.2 02/28/2021 1155   UROBILINOGEN 1.0 05/25/2013 0405   NITRITE NEGATIVE 04/18/2021 0837   LEUKOCYTESUR NEGATIVE 04/18/2021 0837    Radiological Exams on Admission: DG Pelvis Portable  Result Date: 05/27/2022 CLINICAL DATA:  Trauma to the left lower extremity. EXAM: PORTABLE PELVIS 1-2 VIEWS; LEFT FEMUR 2 VIEWS; LEFT TIBIA AND FIBULA - 2 VIEW COMPARISON:  CT of the abdomen pelvis dated 06/17/2013. FINDINGS: There is a displaced oblique fracture of the proximal left femoral diaphysis extending from the subtrochanteric region into the proximal femoral shaft. There is lateral displacement of the distal fracture fragment with mild Verus angulation. There is proximal migration of the femoral diaphysis resulting in a degree of overlap of the fracture fragments. No dislocation. The bones are mildly osteopenic. There is a total left knee arthroplasty. The arthroplasty components appear intact and in anatomic alignment. Evaluation of the tibia and fibula is limited due to overlying support apparatus. The soft tissues are grossly unremarkable. IMPRESSION: 1. Displaced and angulated fracture of the proximal left femoral diaphysis. 2. Total left knee arthroplasty. Electronically Signed   By: Anner Crete M.D.   On: 05/27/2022 02:36   DG Femur Min 2 Views Left  Result Date: 05/27/2022 CLINICAL DATA:  Trauma to the left lower extremity. EXAM: PORTABLE PELVIS 1-2 VIEWS; LEFT FEMUR 2 VIEWS; LEFT TIBIA AND FIBULA - 2 VIEW COMPARISON:  CT of the abdomen pelvis dated 06/17/2013. FINDINGS: There is a displaced oblique fracture of the proximal left femoral diaphysis extending from the subtrochanteric region into the proximal femoral shaft. There is lateral  displacement of the distal fracture fragment with mild Verus angulation. There is proximal migration of the femoral diaphysis resulting in a degree of overlap of the fracture fragments. No dislocation. The bones are  mildly osteopenic. There is a total left knee arthroplasty. The arthroplasty components appear intact and in anatomic alignment. Evaluation of the tibia and fibula is limited due to overlying support apparatus. The soft tissues are grossly unremarkable. IMPRESSION: 1. Displaced and angulated fracture of the proximal left femoral diaphysis. 2. Total left knee arthroplasty. Electronically Signed   By: Anner Crete M.D.   On: 05/27/2022 02:36   DG Tibia/Fibula Left  Result Date: 05/27/2022 CLINICAL DATA:  Trauma to the left lower extremity. EXAM: PORTABLE PELVIS 1-2 VIEWS; LEFT FEMUR 2 VIEWS; LEFT TIBIA AND FIBULA - 2 VIEW COMPARISON:  CT of the abdomen pelvis dated 06/17/2013. FINDINGS: There is a displaced oblique fracture of the proximal left femoral diaphysis extending from the subtrochanteric region into the proximal femoral shaft. There is lateral displacement of the distal fracture fragment with mild Verus angulation. There is proximal migration of the femoral diaphysis resulting in a degree of overlap of the fracture fragments. No dislocation. The bones are mildly osteopenic. There is a total left knee arthroplasty. The arthroplasty components appear intact and in anatomic alignment. Evaluation of the tibia and fibula is limited due to overlying support apparatus. The soft tissues are grossly unremarkable. IMPRESSION: 1. Displaced and angulated fracture of the proximal left femoral diaphysis. 2. Total left knee arthroplasty. Electronically Signed   By: Anner Crete M.D.   On: 05/27/2022 02:36   DG Chest Port 1 View  Result Date: 05/27/2022 CLINICAL DATA:  Trauma. EXAM: PORTABLE CHEST 1 VIEW COMPARISON:  Chest radiograph dated 05/20/2012. FINDINGS: No focal consolidation, pleural  effusion, pneumothorax. The cardiac silhouette is within normal limits. No acute osseous pathology. IMPRESSION: No active disease. Electronically Signed   By: Anner Crete M.D.   On: 05/27/2022 02:33    EKG: Independently reviewed.  Sinus rhythm, no significant change since prior tracing.  Assessment and Plan  Displaced and angulated fracture of the proximal left femoral diaphysis -Secondary to a mechanical fall -Orthopedics consulted -Keep n.p.o. -Hold home aspirin -Continue pain management: Dilaudid as needed for severe pain, Norco as needed for moderate pain, Robaxin as needed  Hypotension and lactic acidosis Blood pressure initially 82/40 with EMS and improved after 500 cc fluid bolus.  Lactic acid elevated at 3.3.  No fever, tachycardia, or leukocytosis to suggest sepsis.  Chest x-ray not suggestive of pneumonia. -Receiving additional 1 L IV fluid bolus ordered in the ED.  Repeat labs to check lactate. -UA pending  Normal anion gap metabolic acidosis Likely secondary to alcohol use and lactic acidosis. -Patient received IV fluids, repeat BMP ordered  Alcohol use disorder Blood ethanol level 228, likely contributed to her fall. -CIWA protocol; Ativan as needed -Thiamine, folate, multivitamin  DVT prophylaxis: SCDs Code Status: Full Code Level of care: Telemetry bed Admission status: It is my clinical opinion that admission to INPATIENT is reasonable and necessary because of the expectation that this patient will require hospital care that crosses at least 2 midnights to treat this condition based on the medical complexity of the problems presented.  Given the aforementioned information, the predictability of an adverse outcome is felt to be significant.   Shela Leff MD [facility]  If 7PM-7AM, please contact night-coverage [email or web address]  05/27/2022, 5:30 AM

## 2022-05-27 NOTE — Anesthesia Preprocedure Evaluation (Addendum)
Anesthesia Evaluation  Patient identified by MRN, date of birth, ID band Patient awake    Reviewed: Allergy & Precautions, NPO status , Patient's Chart, lab work & pertinent test results  History of Anesthesia Complications Negative for: history of anesthetic complications  Airway Mallampati: II  TM Distance: >3 FB Neck ROM: Full    Dental  (+) Dental Advisory Given, Teeth Intact   Pulmonary neg pulmonary ROS   Pulmonary exam normal        Cardiovascular negative cardio ROS Normal cardiovascular exam     Neuro/Psych  PSYCHIATRIC DISORDERS Anxiety     negative neurological ROS     GI/Hepatic ,GERD  Medicated and Controlled,,(+)     substance abuse  alcohol use Active nausea   Endo/Other  negative endocrine ROS    Renal/GU negative Renal ROS     Musculoskeletal  (+) Arthritis ,    Abdominal   Peds  Hematology negative hematology ROS (+)   Anesthesia Other Findings   Reproductive/Obstetrics                             Anesthesia Physical Anesthesia Plan  ASA: 3  Anesthesia Plan: General   Post-op Pain Management: Tylenol PO (pre-op)*   Induction: Intravenous and Rapid sequence  PONV Risk Score and Plan: 3 and Treatment may vary due to age or medical condition, Ondansetron, Dexamethasone and Midazolam  Airway Management Planned: Oral ETT  Additional Equipment: None  Intra-op Plan:   Post-operative Plan: Extubation in OR  Informed Consent: I have reviewed the patients History and Physical, chart, labs and discussed the procedure including the risks, benefits and alternatives for the proposed anesthesia with the patient or authorized representative who has indicated his/her understanding and acceptance.     Dental advisory given  Plan Discussed with: CRNA and Anesthesiologist  Anesthesia Plan Comments:         Anesthesia Quick Evaluation

## 2022-05-27 NOTE — Consult Note (Signed)
Orthopaedic Trauma Service (OTS) Consult   Patient ID: Julia Murray MRN: [id number] DOB/AGE: April 12, 1956 67 y.o.  Reason for Consult:Left proximal femur fracture Referring Physician: Dr. Gerlene Fee, MD [facility]  HPI: Julia Murray is an 67 y.o. female who is being seen in consultation at the request of Dr. Sedonia Small for evaluation of left proximal femur fracture.  The patient was getting to bed yesterday evening she was wearing a different kind of sock and slipped and fell off the bed landing on her left leg having immediate pain and deformity.  She presented to the emergency room for evaluation and x-rays showed a proximal femur fracture.  She recently had a total knee arthroplasty with Dr. Tonita Cong of [facility].  They had contacted Dr. Lyla Glassing who felt that this was outside the scope of practice and required treatment by an orthopedic traumatologist.  Patient was seen and evaluated in the emergency room.  Currently comfortable in Buck's traction.  Denies any other injuries.  States that she is otherwise healthy.  She is retired and lives at home with her husband.  She stays on the first floor.  Denies any tobacco use.  She has done well following her knee replacement which is in 2022.  She has not had any problems with her left hip.  Past Medical History:  Diagnosis Date   Arthritis    knee, back   GERD (gastroesophageal reflux disease)    Hyperlipidemia     Past Surgical History:  Procedure Laterality Date   ANTERIOR CRUCIATE LIGAMENT REPAIR     left   AUGMENTATION MAMMAPLASTY Bilateral 2021   re-do   BREAST BIOPSY Bilateral    multiple times per patient   BREAST SURGERY     mutiple bx   COLONOSCOPY  04/04/2008   at [facility] done   KNEE ARTHROSCOPY  05/20/2012   Procedure: ARTHROSCOPY KNEE;  Surgeon: Johnn Hai, MD;  Location: [facility];  Service: Orthopedics;  Laterality: Right;  right knee scope with incision and drainage and removal of  loose foreign body    KNEE ARTHROSCOPY WITH MEDIAL MENISECTOMY  04/25/2012   Procedure: KNEE ARTHROSCOPY WITH MEDIAL MENISECTOMY;  Surgeon: Johnn Hai, MD;  Location: [place];  Service: Orthopedics;  Laterality: Right;  Right Knee Arthrscopy with Partial Medial and Lateral Menisectomy with Removal of Loose Bodies   TONSILLECTOMY     TOTAL KNEE ARTHROPLASTY Left 04/28/2021   Procedure: TOTAL KNEE ARTHROPLASTY, REMOVAL OF HARDWARE (ACL SCREWS);  Surgeon: Susa Day, MD;  [facility];  Service: Orthopedics;  Laterality: Left;    Family History  Problem Relation Age of Onset   Heart disease Father    Heart attack Father    Diabetes Mother    Colon cancer Neg Hx    Esophageal cancer Neg Hx    Rectal cancer Neg Hx    Stomach cancer Neg Hx    Colon polyps Neg Hx     Social History:  reports that she has never smoked. She has never used smokeless tobacco. She reports current alcohol use of about 8.0 standard drinks of alcohol per week. She reports that she does not use drugs.  Allergies:  Allergies  Allergen Reactions   Shellfish Allergy Hives and Swelling    Pt States betadine ok   Bee Venom Swelling   Crab Extract Allergy Skin Test Hives and Swelling    Medications:  No current facility-administered medications on file prior to encounter.  Current Outpatient Medications on File Prior to Encounter  Medication Sig Dispense Refill   aspirin EC 81 MG tablet Take 1 tablet (81 mg total) by mouth 2 (two) times daily after a meal. Day after surgery (Patient taking differently: Take 81 mg by mouth daily.) 60 tablet 1   diclofenac Sodium (VOLTAREN) 1 % GEL Apply 1 application topically daily as needed (pain).     fluticasone (FLONASE) 50 MCG/ACT nasal spray SPRAY TWO SPRAYS IN EACH NOSTRIL ONCE DAILY (Patient taking differently: Place 2 sprays into both nostrils daily.) 16 mL prn   lansoprazole (PREVACID) 15 MG capsule TAKE 1 CAPSULE DAILY AT 12 NOON (Patient  taking differently: Take 15 mg by mouth daily as needed (heartburn).) 90 capsule 3   meloxicam (MOBIC) 15 MG tablet Take 1 tablet (15 mg total) by mouth daily. 30 tablet 0   Multiple Vitamins-Minerals (MULTIVITAMIN WITH MINERALS) tablet Take 1 tablet by mouth daily.     valACYclovir (VALTREX) 500 MG tablet TAKE ONE TABLET BY MOUTH ONCE WEEKLY WHICH PREVENTS OUTBREAKS OF HSV (Patient taking differently: Take 500 mg by mouth daily as needed (fever blisters).) 12 tablet 3     ROS: Constitutional: No fever or chills Vision: No changes in vision ENT: No difficulty swallowing CV: No chest pain Pulm: No SOB or wheezing GI: No nausea or vomiting GU: No urgency or inability to hold urine Skin: No poor wound healing Neurologic: No numbness or tingling Psychiatric: No depression or anxiety Heme: No bruising Allergic: No reaction to medications or food   Exam: Blood pressure 133/87, pulse 82, temperature 97.8 F (36.6 C), temperature source Oral, resp. rate 12, height '5\' 6"'$  (1.676 m), weight 81.6 kg, SpO2 99 %. General: No acute distress Orientation: Awake alert and oriented x 3 Mood and Affect: Cooperative and pleasant Gait: Unable to assess due to her fracture Coordination and balance: Within normal limits  Left lower extremity: Buck's traction is in place.  Compartments are soft compressible.  Some shortened and externally rotated deformity to the leg.  Thigh is swollen but appropriately so.  No numbness or tingling.  Patient is able to dorsiflex and plantarflex ankle and toes.  Sensation is intact to light touch to all nerve distributions.  She has a warm well-perfused foot with brisk cap refill of less than 2 seconds.  Right lower extremity: Skin without lesions. No tenderness to palpation. Full painless ROM, full strength in each muscle groups without evidence of instability.   Medical Decision Making: Data: Imaging: X-rays of the left femur show an bleak proximal femur fracture with  varus angulation and shortening.  No significant involvement of the femoral neck or head.  Knee replacement appears to be in good position without any signs of abnormalities.  Labs:  Results for orders placed or performed during the hospital encounter of 05/27/22 (from the past 24 hour(s))  Comprehensive metabolic panel     Status: Abnormal   Collection Time: 05/27/22  1:36 AM  Result Value Ref Range   Sodium 135 135 - 145 mmol/L   Potassium 3.8 3.5 - 5.1 mmol/L   Chloride 105 98 - 111 mmol/L   CO2 17 (L) 22 - 32 mmol/L   Glucose, Bld 94 70 - 99 mg/dL   BUN 21 8 - 23 mg/dL   Creatinine, Ser 0.63 0.44 - 1.00 mg/dL   Calcium 8.4 (L) 8.9 - 10.3 mg/dL   Total Protein 6.3 (L) 6.5 - 8.1 g/dL   Albumin 3.7 3.5 - 5.0 g/dL  AST 29 15 - 41 U/L   ALT 24 0 - 44 U/L   Alkaline Phosphatase 69 38 - 126 U/L   Total Bilirubin 0.4 0.3 - 1.2 mg/dL   GFR, Estimated >60 >60 mL/min   Anion gap 13 5 - 15  CBC     Status: None   Collection Time: 05/27/22  1:36 AM  Result Value Ref Range   WBC 9.7 4.0 - 10.5 K/uL   RBC 4.20 3.87 - 5.11 MIL/uL   Hemoglobin 12.9 12.0 - 15.0 g/dL   HCT 40.2 36.0 - 46.0 %   MCV 95.7 80.0 - 100.0 fL   MCH 30.7 26.0 - 34.0 pg   MCHC 32.1 30.0 - 36.0 g/dL   RDW 14.4 11.5 - 15.5 %   Platelets 217 150 - 400 K/uL   nRBC 0.0 0.0 - 0.2 %  Ethanol     Status: Abnormal   Collection Time: 05/27/22  1:36 AM  Result Value Ref Range   Alcohol, Ethyl (B) 228 (H) <10 mg/dL  Lactic acid, plasma     Status: Abnormal   Collection Time: 05/27/22  1:36 AM  Result Value Ref Range   Lactic Acid, Venous 3.3 (HH) 0.5 - 1.9 mmol/L  Protime-INR     Status: None   Collection Time: 05/27/22  1:36 AM  Result Value Ref Range   Prothrombin Time 12.7 11.4 - 15.2 seconds   INR 1.0 0.8 - 1.2  Sample to Blood Bank     Status: None   Collection Time: 05/27/22  1:36 AM  Result Value Ref Range   Blood Bank Specimen SAMPLE AVAILABLE FOR TESTING    Sample Expiration      05/28/2022,2359 Performed  at [facility], [place] [address], [place], [place] [address]   I-Stat Chem 8, ED     Status: Abnormal   Collection Time: 05/27/22  2:12 AM  Result Value Ref Range   Sodium 138 135 - 145 mmol/L   Potassium 3.9 3.5 - 5.1 mmol/L   Chloride 111 98 - 111 mmol/L   BUN 23 8 - 23 mg/dL   Creatinine, Ser 0.70 0.44 - 1.00 mg/dL   Glucose, Bld 88 70 - 99 mg/dL   Calcium, Ion 0.85 (LL) 1.15 - 1.40 mmol/L   TCO2 18 (L) 22 - 32 mmol/L   Hemoglobin 13.3 12.0 - 15.0 g/dL   HCT 39.0 36.0 - 46.0 %   Comment NOTIFIED PHYSICIAN   Basic metabolic panel     Status: Abnormal   Collection Time: 05/27/22  6:58 AM  Result Value Ref Range   Sodium 139 135 - 145 mmol/L   Potassium 4.2 3.5 - 5.1 mmol/L   Chloride 107 98 - 111 mmol/L   CO2 21 (L) 22 - 32 mmol/L   Glucose, Bld 88 70 - 99 mg/dL   BUN 22 8 - 23 mg/dL   Creatinine, Ser 0.58 0.44 - 1.00 mg/dL   Calcium 8.5 (L) 8.9 - 10.3 mg/dL   GFR, Estimated >60 >60 mL/min   Anion gap 11 5 - 15     Imaging or Labs ordered: None   Medical history and chart was reviewed and case discussed with medical provider.  Assessment/Plan: 67 year old female with a left proximal femur fracture.  Patient will require intramedullary nailing of the left femur.  Risks and benefits were discussed with the patient.  Risks include but not limited to bleeding, infection, malunion, nonunion, hardware failure, hardware irritation, nerve or blood vessel  injury, DVT, even the possibility anesthetic complications.  She agrees to proceed with surgery and consent will be obtained.  I discussed routine postoperative course with the patient.  All questions were elicited and answered appropriately.  Shona Needles, MD [facility] [phone] (office) [email or web address]

## 2022-05-27 NOTE — Anesthesia Procedure Notes (Signed)
Procedure Name: Intubation Date/Time: 05/27/2022 10:52 AM  Performed by: Mariea Clonts, CRNAPre-anesthesia Checklist: Patient identified, Emergency Drugs available, Suction available and Patient being monitored Patient Re-evaluated:Patient Re-evaluated prior to induction Oxygen Delivery Method: Circle System Utilized Preoxygenation: Pre-oxygenation with 100% oxygen Induction Type: IV induction, Rapid sequence and Cricoid Pressure applied Laryngoscope Size: Miller and 2 Grade View: Grade I Tube type: Oral Tube size: 7.5 mm Number of attempts: 1 Airway Equipment and Method: Stylet and Oral airway Placement Confirmation: ETT inserted through vocal cords under direct vision, positive ETCO2 and breath sounds checked- equal and bilateral Tube secured with: Tape Dental Injury: Teeth and Oropharynx as per pre-operative assessment

## 2022-05-27 NOTE — ED Notes (Signed)
ED TO INPATIENT HANDOFF REPORT  ED Nurse Name and Phone #:   S Name/Age/Gender Julia Murray 67 y.o. female Room/Bed: MCPO/NONE  Code Status   Code Status: Full Code  Home/SNF/Other Home Patient oriented to: self, place, time, and situation Is this baseline? Yes   Triage Complete: Triage complete  Chief Complaint Femur fracture ([place]) [S72.90XA] Fall, initial encounter [W19.XXXA] Pain of left lower extremity [M79.605]  Triage Note Pt BIB [facility] from home for L upper leg deformity. Pt slipped on ground. Denies LOC, did not hit head, no blood thinners. BP initially 82/40, EMS gave 541m bolus, SBP improved to 120s. Splint applied to L leg. AO4    Allergies Allergies  Allergen Reactions   Shellfish Allergy Hives and Swelling    Pt States betadine ok   Bee Venom Swelling   Crab Extract Allergy Skin Test Hives and Swelling    Level of Care/Admitting Diagnosis ED Disposition     ED Disposition  Admit   Condition  --   Comment  Hospital Area: [facility][[[id number]] Level of Care: Telemetry Medical [104]  [facility] level of care is available:: Yes  Covid Evaluation: Asymptomatic - no recent exposure (last 10 days) testing not required  Diagnosis: Femur fracture ([facility]) [[id number]] Admitting Physician: RShela Leff[[[id number]] Attending Physician: RShela Leff[[[id number] Certification:: I certify this patient will need inpatient services for at least 2 midnights  Estimated Length of Stay: 2          B Medical/Surgery History Past Medical History:  Diagnosis Date   Arthritis    knee, back   GERD (gastroesophageal reflux disease)    Hyperlipidemia    Past Surgical History:  Procedure Laterality Date   ANTERIOR CRUCIATE LIGAMENT REPAIR     left   AUGMENTATION MAMMAPLASTY Bilateral 2021   re-do   BREAST BIOPSY Bilateral    multiple times per patient   BREAST SURGERY     mutiple bx    [facility]   KNEE [facility]THROSCOPY  05/20/2012   Procedure: ARTHROSCOPY KNEE;  Surgeon: JJohnn Hai MD;  [place] [facility]  Service: Orthopedics;  Laterality: Right;  right knee scope with incision and drainage and removal of loose foreign body    KNEE ARTHROSCOPY WITH MEDIAL MENISECTOMY  04/25/2012   Procedure: KNEE ARTHROSCOPY WITH MEDIAL MENISECTOMY;  Surgeon: JJohnn Hai MD;  [place] [facility]  Service: Orthopedics;  Laterality: Right;  Right Knee Arthrscopy with Partial Medial and Lateral Menisectomy with Removal of Loose Bodies   TONSILLECTOMY     TOTAL KNEE ARTHROPLASTY Left 04/28/2021   Procedure: TOTAL KNEE ARTHROPLASTY, REMOVAL OF HARDWARE (ACL SCREWS);  Surgeon: BSusa Day MD;  [facility];  Service: Orthopedics;  Laterality: Left;     A IV Location/Drains/Wounds Patient Lines/Drains/Airways Status     Active Line/Drains/Airways     Name Placement date Placement time Site Days   Peripheral IV 05/27/22 22 G Left Antecubital 05/27/22  0127  Antecubital  less than 1   Closed System Drain 1 Right;Superior Knee Accordion (Hemovac) 10 Fr. 05/20/12  1340  Knee  3659   External Urinary Catheter 05/01/21  1800  --  391   Incision 04/25/12 Knee 04/25/12  1243  -- 3684   Incision 05/20/12 Knee Right 05/20/12  1339  -- 3659   Incision (Closed) 04/28/21 Knee Left  04/28/21  1523  -- 394            Intake/Output Last 24 hours No intake or output data in the 24 hours ending 05/27/22 0940  Labs/Imaging Results for orders placed or performed during the hospital encounter of 05/27/22 (from the past 48 hour(s))  Comprehensive metabolic panel     Status: Abnormal   Collection Time: 05/27/22  1:36 AM  Result Value Ref Range   Sodium 135 135 - 145 mmol/L   Potassium 3.8 3.5 - 5.1 mmol/L   Chloride 105 98 - 111 mmol/L   CO2 17 (L) 22 - 32 mmol/L   Glucose, Bld 94 70 - 99 mg/dL    Comment:  Glucose reference range applies only to samples taken after fasting for at least 8 hours.   BUN 21 8 - 23 mg/dL   Creatinine, Ser 0.63 0.44 - 1.00 mg/dL   Calcium 8.4 (L) 8.9 - 10.3 mg/dL   Total Protein 6.3 (L) 6.5 - 8.1 g/dL   Albumin 3.7 3.5 - 5.0 g/dL   AST 29 15 - 41 U/L   ALT 24 0 - 44 U/L   Alkaline Phosphatase 69 38 - 126 U/L   Total Bilirubin 0.4 0.3 - 1.2 mg/dL   GFR, Estimated >60 >60 mL/min    Comment: (NOTE) Calculated using the CKD-EPI Creatinine Equation (2021)    Anion gap 13 5 - 15    Comment: Performed at [facility], [place] [address], [place] [address]  CBC     Status: None   Collection Time: 05/27/22  1:36 AM  Result Value Ref Range   WBC 9.7 4.0 - 10.5 K/uL   RBC 4.20 3.87 - 5.11 MIL/uL   Hemoglobin 12.9 12.0 - 15.0 g/dL   HCT 40.2 36.0 - 46.0 %   MCV 95.7 80.0 - 100.0 fL   MCH 30.7 26.0 - 34.0 pg   MCHC 32.1 30.0 - 36.0 g/dL   RDW 14.4 11.5 - 15.5 %   Platelets 217 150 - 400 K/uL   nRBC 0.0 0.0 - 0.2 %    Comment: Performed at [facility], [place] [address] [place], [place] [address]  Ethanol     Status: Abnormal   Collection Time: 05/27/22  1:36 AM  Result Value Ref Range   Alcohol, Ethyl (B) 228 (H) <10 mg/dL    Comment: (NOTE) Lowest detectable limit for serum alcohol is 10 mg/dL.  For medical purposes only. Performed at [facility], [place] [address], [place], [place] [address]   Lactic acid, plasma     Status: Abnormal   Collection Time: 05/27/22  1:36 AM  Result Value Ref Range   Lactic Acid, Venous 3.3 (HH) 0.5 - 1.9 mmol/L    Comment: CRITICAL RESULT CALLED TO, READ BACK BY AND VERIFIED WITH Griselda Miner RN 05/27/22 0227 Wiliam Ke Performed at [facility], [place] [address], [place], [place] [address]   Protime-INR     Status: None   Collection Time: 05/27/22  1:36 AM  Result Value Ref Range   Prothrombin Time 12.7 11.4 - 15.2 seconds   INR 1.0 0.8 - 1.2    Comment: (NOTE) INR goal varies based on  device and disease states. Performed at [facility], [place] [address] [place], [place] [address]   Sample to Blood Bank     Status: None   Collection Time: 05/27/22  1:36 AM  Result Value Ref Range  Blood Bank Specimen SAMPLE AVAILABLE FOR TESTING    Sample Expiration      05/28/2022,2359 Performed at [facility], [place] [address] [place], [place] [address]   I-Stat Chem 8, ED     Status: Abnormal   Collection Time: 05/27/22  2:12 AM  Result Value Ref Range   Sodium 138 135 - 145 mmol/L   Potassium 3.9 3.5 - 5.1 mmol/L   Chloride 111 98 - 111 mmol/L   BUN 23 8 - 23 mg/dL   Creatinine, Ser 0.70 0.44 - 1.00 mg/dL   Glucose, Bld 88 70 - 99 mg/dL    Comment: Glucose reference range applies only to samples taken after fasting for at least 8 hours.   Calcium, Ion 0.85 (LL) 1.15 - 1.40 mmol/L   TCO2 18 (L) 22 - 32 mmol/L   Hemoglobin 13.3 12.0 - 15.0 g/dL   HCT 39.0 36.0 - 46.0 %   Comment NOTIFIED PHYSICIAN   Basic metabolic panel     Status: Abnormal   Collection Time: 05/27/22  6:58 AM  Result Value Ref Range   Sodium 139 135 - 145 mmol/L   Potassium 4.2 3.5 - 5.1 mmol/L   Chloride 107 98 - 111 mmol/L   CO2 21 (L) 22 - 32 mmol/L   Glucose, Bld 88 70 - 99 mg/dL    Comment: Glucose reference range applies only to samples taken after fasting for at least 8 hours.   BUN 22 8 - 23 mg/dL   Creatinine, Ser 0.58 0.44 - 1.00 mg/dL   Calcium 8.5 (L) 8.9 - 10.3 mg/dL   GFR, Estimated >60 >60 mL/min    Comment: (NOTE) Calculated using the CKD-EPI Creatinine Equation (2021)    Anion gap 11 5 - 15    Comment: Performed at [place] [facility] [address] [place], [place] [address]   DG Pelvis Portable  Result Date: 05/27/2022 CLINICAL DATA:  Trauma to the left lower extremity. EXAM: PORTABLE PELVIS 1-2 VIEWS; LEFT FEMUR 2 VIEWS; LEFT TIBIA AND FIBULA - 2 VIEW COMPARISON:  CT of the abdomen pelvis dated 06/17/2013. FINDINGS: There is a displaced oblique fracture of the  proximal left femoral diaphysis extending from the subtrochanteric region into the proximal femoral shaft. There is lateral displacement of the distal fracture fragment with mild Verus angulation. There is proximal migration of the femoral diaphysis resulting in a degree of overlap of the fracture fragments. No dislocation. The bones are mildly osteopenic. There is a total left knee arthroplasty. The arthroplasty components appear intact and in anatomic alignment. Evaluation of the tibia and fibula is limited due to overlying support apparatus. The soft tissues are grossly unremarkable. IMPRESSION: 1. Displaced and angulated fracture of the proximal left femoral diaphysis. 2. Total left knee arthroplasty. Electronically Signed   By: Anner Crete M.D.   On: 05/27/2022 02:36   DG Femur Min 2 Views Left  Result Date: 05/27/2022 CLINICAL DATA:  Trauma to the left lower extremity. EXAM: PORTABLE PELVIS 1-2 VIEWS; LEFT FEMUR 2 VIEWS; LEFT TIBIA AND FIBULA - 2 VIEW COMPARISON:  CT of the abdomen pelvis dated 06/17/2013. FINDINGS: There is a displaced oblique fracture of the proximal left femoral diaphysis extending from the subtrochanteric region into the proximal femoral shaft. There is lateral displacement of the distal fracture fragment with mild Verus angulation. There is proximal migration of the femoral diaphysis resulting in a degree of overlap of the fracture fragments. No dislocation. The bones are mildly osteopenic. There  is a total left knee arthroplasty. The arthroplasty components appear intact and in anatomic alignment. Evaluation of the tibia and fibula is limited due to overlying support apparatus. The soft tissues are grossly unremarkable. IMPRESSION: 1. Displaced and angulated fracture of the proximal left femoral diaphysis. 2. Total left knee arthroplasty. Electronically Signed   By: Anner Crete M.D.   On: 05/27/2022 02:36   DG Tibia/Fibula Left  Result Date: 05/27/2022 CLINICAL DATA:   Trauma to the left lower extremity. EXAM: PORTABLE PELVIS 1-2 VIEWS; LEFT FEMUR 2 VIEWS; LEFT TIBIA AND FIBULA - 2 VIEW COMPARISON:  CT of the abdomen pelvis dated 06/17/2013. FINDINGS: There is a displaced oblique fracture of the proximal left femoral diaphysis extending from the subtrochanteric region into the proximal femoral shaft. There is lateral displacement of the distal fracture fragment with mild Verus angulation. There is proximal migration of the femoral diaphysis resulting in a degree of overlap of the fracture fragments. No dislocation. The bones are mildly osteopenic. There is a total left knee arthroplasty. The arthroplasty components appear intact and in anatomic alignment. Evaluation of the tibia and fibula is limited due to overlying support apparatus. The soft tissues are grossly unremarkable. IMPRESSION: 1. Displaced and angulated fracture of the proximal left femoral diaphysis. 2. Total left knee arthroplasty. Electronically Signed   By: Anner Crete M.D.   On: 05/27/2022 02:36   DG Chest Port 1 View  Result Date: 05/27/2022 CLINICAL DATA:  Trauma. EXAM: PORTABLE CHEST 1 VIEW COMPARISON:  Chest radiograph dated 05/20/2012. FINDINGS: No focal consolidation, pleural effusion, pneumothorax. The cardiac silhouette is within normal limits. No acute osseous pathology. IMPRESSION: No active disease. Electronically Signed   By: Anner Crete M.D.   On: 05/27/2022 02:33    Pending Labs Unresulted Labs (From admission, onward)     Start     Ordered   05/27/22 0621  Lactic acid, plasma  ONCE - STAT,   STAT        05/27/22 0621   05/27/22 0618  HIV Antibody (routine testing w rflx)  (HIV Antibody (Routine testing w reflex) panel)  Once,   R        05/27/22 0621   05/27/22 0133  Urinalysis, Routine w reflex microscopic  (Trauma Panel)  Once,   URGENT        05/27/22 0133            Vitals/Pain Today's Vitals   05/27/22 0647 05/27/22 0700 05/27/22 0739 05/27/22 0739  BP:   133/87    Pulse:  82    Resp:  12    Temp:   97.8 F (36.6 C)   TempSrc:   Oral   SpO2:  99%    Weight:      Height:      PainSc: 7    10-Worst pain ever    Isolation Precautions No active isolations  Medications Medications  HYDROmorphone (DILAUDID) injection 1 mg ( Intravenous MAR Hold 05/27/22 0934)  HYDROcodone-acetaminophen (NORCO/VICODIN) 5-325 MG per tablet 1-2 tablet ( Oral MAR Hold 05/27/22 0934)  naloxone [facility]) injection 0.4 mg ( Intravenous MAR Hold 05/27/22 0934)  methocarbamol (ROBAXIN) tablet 500 mg ( Oral MAR Hold 05/27/22 0934)    Or  methocarbamol (ROBAXIN) 500 mg in dextrose 5 % 50 mL IVPB ( Intravenous MAR Hold 05/27/22 0934)  LORazepam (ATIVAN) tablet 1-4 mg ( Oral MAR Hold 05/27/22 0934)    Or  LORazepam (ATIVAN) injection 1-4 mg ( Intravenous MAR Hold 05/27/22 0934)  thiamine (VITAMIN B1) tablet 100 mg ( Oral Automatically Held 06/04/22 1000)    Or  thiamine (VITAMIN B1) injection 100 mg ( Intravenous Automatically Held 3/50/75 7322)  folic acid (FOLVITE) tablet 1 mg ( Oral Automatically Held 06/04/22 1000)  multivitamin with minerals tablet 1 tablet ( Oral Automatically Held 06/04/22 1000)  acetaminophen (TYLENOL) tablet 1,000 mg (has no administration in time range)  ceFAZolin (ANCEF) IVPB 2g/100 mL premix ( Intravenous MAR Hold 05/27/22 0934)  HYDROmorphone (DILAUDID) injection 1 mg (1 mg Intravenous Given 05/27/22 0230)  HYDROmorphone (DILAUDID) injection 1 mg (1 mg Intravenous Given 05/27/22 0250)  HYDROmorphone (DILAUDID) injection 1 mg (1 mg Intravenous Given 05/27/22 0438)  lactated ringers bolus 1,000 mL (0 mLs Intravenous Stopped 05/27/22 0748)    Mobility      Focused Assessments    R Recommendations: See Admitting Provider Note  Report given to:   Additional Notes:

## 2022-05-27 NOTE — Progress Notes (Signed)
Orthopedic Tech Progress Note Patient Details:  Julia Murray 1955/08/26 [id number]  Communicated with Luetta Nutting, RN about needing a hospital bed to apply bucks traction. She will let me know when the bed arrives.  Patient ID: Julia Murray, female   DOB: [birth year], 67 y.o.   MRN: [id number]  [name] 05/27/2022, 7:43 AM

## 2022-05-27 NOTE — ED Notes (Signed)
XR at bedside

## 2022-05-27 NOTE — ED Triage Notes (Signed)
Pt BIB [facility] from home for L upper leg deformity. Pt slipped on ground. Denies LOC, did not hit head, no blood thinners. BP initially 82/40, EMS gave 531m bolus, SBP improved to 120s. Splint applied to L leg. AO4

## 2022-05-27 NOTE — Progress Notes (Signed)
PROGRESS NOTE    Julia Murray  IZT:[id number] DOB: 09-12-1955 DOA: 05/27/2022 PCP: Bridget Hartshorn, NP   Brief Narrative:  Julia Murray is a 67 y.o. female with medical history significant of GERD, hyperlipidemia, alcohol use disorder presented to ED complaining of left leg pain and deformity after a mechanical fall.  Blood pressure initially 82/40 with EMS and was given a 500 mL bolus after which SBP improved to 120s.  Labs notable for bicarb 17, glucose 94, blood ethanol level 228, lactic acid 3.3.  Chest x-ray showing no active disease.  X-ray showing displaced and angulated fracture of the proximal left femoral diaphysis. ED physician consulted orthopedics (Dr. Doreatha Martin), recommended keeping n.p.o. and planning on taking her to the OR sometime today.  Patient was given doses of Dilaudid for pain and 1 L IV fluid bolus ordered.  [facility] called to admit.   Assessment & Plan:   Left proximal femur fracture: -Secondary to a mechanical fall -Orthopedics consulted-recommended intramedullary nailing of the left femur -Keep n.p.o. -Hold home aspirin -Continue pain management: Dilaudid as needed for severe pain, Norco as needed for moderate pain, Robaxin as needed   Hypotension and lactic acidosis -Blood pressure initially 82/40 with EMS and improved after 500 cc fluid bolus.  Lactic acid elevated at 3.3.  No fever, tachycardia, or leukocytosis to suggest sepsis.  Chest x-ray not suggestive of pneumonia. -Blood pressure have improved.  Continue IV fluids.  Trend lactic acid -UA pending   Normal anion gap metabolic acidosis -Likely secondary to alcohol use and lactic acidosis. -Patient received IV fluids   Alcohol use disorder -Blood ethanol level 228, likely contributed to her fall.  Patient reports that she drinks twice a week.  Last night she had 4-5 drinks. -CIWA protocol; Ativan as needed -Thiamine, folate, multivitamin  DVT prophylaxis: SCD Code Status: Full code Family  Communication:  None present at bedside.  Plan of care discussed with patient in length and she verbalized understanding and agreed with it. Disposition Plan: To be determined  Consultants:  Orthopedic  Procedures:  None  Antimicrobials:  None  Status is: Inpatient   Subjective: Patient seen and examined in ER.  Tearful due to severe pain.  Reports that she drinks alcohol twice in a week.  Last night she had 5 drinks of alcohol.  She denies tobacco abuse, illicit drug use.  No previous history of osteoporosis  Objective: Vitals:   05/27/22 0605 05/27/22 0700 05/27/22 0739 05/27/22 0950  BP: 110/74 133/87  122/65  Pulse: 73 82  78  Resp: '11 12  16  '$ Temp:   97.8 F (36.6 C) 97.9 F (36.6 C)  TempSrc:   Oral Oral  SpO2: 96% 99%  96%  Weight:    81.6 kg  Height:    5' 5.98" (1.676 m)   No intake or output data in the 24 hours ending 05/27/22 0959 Filed Weights   05/27/22 0155 05/27/22 0950  Weight: 81.6 kg 81.6 kg    Examination:  General exam: Tearful due to pain.  On room air Respiratory system: Clear to auscultation. Respiratory effort normal. Cardiovascular system: S1 & S2 heard, RRR. No JVD, murmurs, rubs, gallops or clicks. No pedal edema. Gastrointestinal system: Abdomen is nondistended, soft and nontender. No organomegaly or masses felt. Normal bowel sounds heard. Central nervous system: Alert and oriented. No focal neurological deficits. Extremities: Buck's traction is in place.  Left leg is shortened and externally rotated.  Left thigh is swollen as compared to  right thigh.  Pulses palpable in left lower leg. Psychiatry: Judgement and insight appear normal. Mood & affect appropriate.    Data Reviewed: I have personally reviewed following labs and imaging studies  CBC: Recent Labs  Lab 05/27/22 0136 05/27/22 0212  WBC 9.7  --   HGB 12.9 13.3  HCT 40.2 39.0  MCV 95.7  --   PLT 217  --    Basic Metabolic Panel: Recent Labs  Lab 05/27/22 0136  05/27/22 0212 05/27/22 0658  NA 135 138 139  K 3.8 3.9 4.2  CL 105 111 107  CO2 17*  --  21*  GLUCOSE 94 88 88  BUN '21 23 22  '$ CREATININE 0.63 0.70 0.58  CALCIUM 8.4*  --  8.5*   GFR: Estimated Creatinine Clearance: 73.5 mL/min (by C-G formula based on SCr of 0.58 mg/dL). Liver Function Tests: Recent Labs  Lab 05/27/22 0136  AST 29  ALT 24  ALKPHOS 69  BILITOT 0.4  PROT 6.3*  ALBUMIN 3.7   No results for input(s): "LIPASE", "AMYLASE" in the last 168 hours. No results for input(s): "AMMONIA" in the last 168 hours. Coagulation Profile: Recent Labs  Lab 05/27/22 0136  INR 1.0   Cardiac Enzymes: No results for input(s): "CKTOTAL", "CKMB", "CKMBINDEX", "TROPONINI" in the last 168 hours. BNP (last 3 results) No results for input(s): "PROBNP" in the last 8760 hours. HbA1C: No results for input(s): "HGBA1C" in the last 72 hours. CBG: No results for input(s): "GLUCAP" in the last 168 hours. Lipid Profile: No results for input(s): "CHOL", "HDL", "LDLCALC", "TRIG", "CHOLHDL", "LDLDIRECT" in the last 72 hours. Thyroid Function Tests: No results for input(s): "TSH", "T4TOTAL", "FREET4", "T3FREE", "THYROIDAB" in the last 72 hours. Anemia Panel: No results for input(s): "VITAMINB12", "FOLATE", "FERRITIN", "TIBC", "IRON", "RETICCTPCT" in the last 72 hours. Sepsis Labs: Recent Labs  Lab 05/27/22 0136  LATICACIDVEN 3.3*    No results found for this or any previous visit (from the past 240 hour(s)).    Radiology Studies: DG Pelvis Portable  Result Date: 05/27/2022 CLINICAL DATA:  Trauma to the left lower extremity. EXAM: PORTABLE PELVIS 1-2 VIEWS; LEFT FEMUR 2 VIEWS; LEFT TIBIA AND FIBULA - 2 VIEW COMPARISON:  CT of the abdomen pelvis dated 06/17/2013. FINDINGS: There is a displaced oblique fracture of the proximal left femoral diaphysis extending from the subtrochanteric region into the proximal femoral shaft. There is lateral displacement of the distal fracture fragment with  mild Verus angulation. There is proximal migration of the femoral diaphysis resulting in a degree of overlap of the fracture fragments. No dislocation. The bones are mildly osteopenic. There is a total left knee arthroplasty. The arthroplasty components appear intact and in anatomic alignment. Evaluation of the tibia and fibula is limited due to overlying support apparatus. The soft tissues are grossly unremarkable. IMPRESSION: 1. Displaced and angulated fracture of the proximal left femoral diaphysis. 2. Total left knee arthroplasty. Electronically Signed   By: Anner Crete M.D.   On: 05/27/2022 02:36   DG Femur Min 2 Views Left  Result Date: 05/27/2022 CLINICAL DATA:  Trauma to the left lower extremity. EXAM: PORTABLE PELVIS 1-2 VIEWS; LEFT FEMUR 2 VIEWS; LEFT TIBIA AND FIBULA - 2 VIEW COMPARISON:  CT of the abdomen pelvis dated 06/17/2013. FINDINGS: There is a displaced oblique fracture of the proximal left femoral diaphysis extending from the subtrochanteric region into the proximal femoral shaft. There is lateral displacement of the distal fracture fragment with mild Verus angulation. There is proximal migration of  the femoral diaphysis resulting in a degree of overlap of the fracture fragments. No dislocation. The bones are mildly osteopenic. There is a total left knee arthroplasty. The arthroplasty components appear intact and in anatomic alignment. Evaluation of the tibia and fibula is limited due to overlying support apparatus. The soft tissues are grossly unremarkable. IMPRESSION: 1. Displaced and angulated fracture of the proximal left femoral diaphysis. 2. Total left knee arthroplasty. Electronically Signed   By: Anner Crete M.D.   On: 05/27/2022 02:36   DG Tibia/Fibula Left  Result Date: 05/27/2022 CLINICAL DATA:  Trauma to the left lower extremity. EXAM: PORTABLE PELVIS 1-2 VIEWS; LEFT FEMUR 2 VIEWS; LEFT TIBIA AND FIBULA - 2 VIEW COMPARISON:  CT of the abdomen pelvis dated 06/17/2013.  FINDINGS: There is a displaced oblique fracture of the proximal left femoral diaphysis extending from the subtrochanteric region into the proximal femoral shaft. There is lateral displacement of the distal fracture fragment with mild Verus angulation. There is proximal migration of the femoral diaphysis resulting in a degree of overlap of the fracture fragments. No dislocation. The bones are mildly osteopenic. There is a total left knee arthroplasty. The arthroplasty components appear intact and in anatomic alignment. Evaluation of the tibia and fibula is limited due to overlying support apparatus. The soft tissues are grossly unremarkable. IMPRESSION: 1. Displaced and angulated fracture of the proximal left femoral diaphysis. 2. Total left knee arthroplasty. Electronically Signed   By: Anner Crete M.D.   On: 05/27/2022 02:36   DG Chest Port 1 View  Result Date: 05/27/2022 CLINICAL DATA:  Trauma. EXAM: PORTABLE CHEST 1 VIEW COMPARISON:  Chest radiograph dated 05/20/2012. FINDINGS: No focal consolidation, pleural effusion, pneumothorax. The cardiac silhouette is within normal limits. No acute osseous pathology. IMPRESSION: No active disease. Electronically Signed   By: Anner Crete M.D.   On: 05/27/2022 02:33    Scheduled Meds:  acetaminophen  1,000 mg Oral Once   chlorhexidine  15 mL Mouth/Throat Once   Or   mouth rinse  15 mL Mouth Rinse Once   chlorhexidine       [MAR Hold] folic acid  1 mg Oral Daily   [MAR Hold] multivitamin with minerals  1 tablet Oral Daily   [MAR Hold] thiamine  100 mg Oral Daily   Or   [MAR Hold] thiamine  100 mg Intravenous Daily   Continuous Infusions:  ceFAZolin     [MAR Hold]  ceFAZolin (ANCEF) IV     lactated ringers     [MAR Hold] methocarbamol (ROBAXIN) IV       LOS: 0 days   Time spent: 35 minutes   Aliea Bobe Loann Quill, MD [facility]  If 7PM-7AM, please contact night-coverage [email or web address] 05/27/2022, 9:59 AM

## 2022-05-28 ENCOUNTER — Other Ambulatory Visit (HOSPITAL_COMMUNITY): Payer: Self-pay

## 2022-05-28 DIAGNOSIS — S7292XA Unspecified fracture of left femur, initial encounter for closed fracture: Secondary | ICD-10-CM | POA: Diagnosis not present

## 2022-05-28 LAB — CBC
HCT: 26.6 % — ABNORMAL LOW (ref 36.0–46.0)
Hemoglobin: 8.7 g/dL — ABNORMAL LOW (ref 12.0–15.0)
MCH: 30.4 pg (ref 26.0–34.0)
MCHC: 32.7 g/dL (ref 30.0–36.0)
MCV: 93 fL (ref 80.0–100.0)
Platelets: 193 10*3/uL (ref 150–400)
RBC: 2.86 MIL/uL — ABNORMAL LOW (ref 3.87–5.11)
RDW: 14.2 % (ref 11.5–15.5)
WBC: 6.5 10*3/uL (ref 4.0–10.5)
nRBC: 0 % (ref 0.0–0.2)

## 2022-05-28 LAB — BASIC METABOLIC PANEL
Anion gap: 4 — ABNORMAL LOW (ref 5–15)
BUN: 18 mg/dL (ref 8–23)
CO2: 25 mmol/L (ref 22–32)
Calcium: 8 mg/dL — ABNORMAL LOW (ref 8.9–10.3)
Chloride: 101 mmol/L (ref 98–111)
Creatinine, Ser: 0.56 mg/dL (ref 0.44–1.00)
GFR, Estimated: >60 mL/min (ref >60)
Glucose, Bld: 177 mg/dL — ABNORMAL HIGH (ref 70–99)
Potassium: 4 mmol/L (ref 3.5–5.1)
Sodium: 130 mmol/L — ABNORMAL LOW (ref 135–145)

## 2022-05-28 MED ORDER — METHOCARBAMOL 500 MG PO TABS
500.0000 mg | ORAL_TABLET | Freq: Four times a day (QID) | ORAL | 0 refills | Status: DC | PRN
  Filled 2022-05-28: qty 28, 7d supply, fill #0

## 2022-05-28 MED ORDER — APIXABAN 2.5 MG PO TABS
2.5000 mg | ORAL_TABLET | Freq: Two times a day (BID) | ORAL | 0 refills | Status: DC
  Filled 2022-05-28: qty 60, 30d supply, fill #0

## 2022-05-28 MED ORDER — HYDROCODONE-ACETAMINOPHEN 5-325 MG PO TABS
1.0000 | ORAL_TABLET | ORAL | 0 refills | Status: DC | PRN
  Filled 2022-05-28: qty 30, 4d supply, fill #0

## 2022-05-28 MED ORDER — ACETAMINOPHEN 325 MG PO TABS: 325.0000 mg | ORAL_TABLET | Freq: Four times a day (QID) | ORAL | Status: AC | PRN

## 2022-05-28 NOTE — Care Management Obs Status (Signed)
[place] NOTIFICATION   Patient Details  Name: Julia Murray MRN: [id number] Date of Birth: 1955-05-23   Medicare Observation Status Notification Given:  Yes    Bartholomew Crews, RN 05/28/2022, 2:24 PM

## 2022-05-28 NOTE — Progress Notes (Signed)
Physician Discharge Summary  Julia Murray:[id number] DOB: 17-Jul-1955 DOA: 05/27/2022  PCP: Bridget Hartshorn, NP  Admit date: 05/27/2022 Discharge date: 05/28/2022  Admitted From: Home Discharge disposition: Home with outpatient PT  Recommendations at discharge:  Outpatient PT recommended by physical therapy Judicious use of pain medicines DVT prophylaxis with Eliquis for 30 days per orthopedics Follow-up with PCP as an outpatient for repeat sodium level in 1 week   Brief narrative: Julia Murray is a 67 y.o. female with PMH significant for chronic alcohol use, HLD, GERD, arthritis. 1/21, patient presented to the ED after a fall with immediate left leg pain and deformity.  Apparently she was under the influence of alcohol at the time. EMS noted low blood pressure at 82/40 which improved with IV fluid In the ED, blood alcohol level was elevated to 228, lactic acid level was elevated at 3.3 X-ray pelvis showed displaced and angulated fracture of the proximal left femoral diaphysis Orthopedics consulted Admitted to [facility] 1/21, underwent intramedullary nailing of the left femur  Subjective: Patient was seen and examined this morning.  Pleasant elderly Caucasian female.  Propped up in bed.  Not in distress.  Pain controlled.  Reports she has been able to be up and walking to the bathroom.  Assessment and plan: Left proximal femur fracture Secondary to a fall Orthopedics consulted. 1/21, underwent intramedullary nailing of left femur Per orthopedics, pain management with Norco as needed, Robaxin as needed. DVT prophylaxis with Eliquis twice daily for 30 days  HypOtension Blood pressure was initially low at 82/40 per EMS.  It is probably because of blood loss from long bone fracture.  Blood pressure improved with IV hydration.  Currently stable PTA, patient was not on any antihypertensives.  Lactic acidosis No evidence of infection but initial lactic acid level was elevated  to 3.3.  Probably from transient hypotension as well as use of alcohol.  Lactic acid level has normalized Recent Labs  Lab 05/27/22 0136 05/27/22 1523 05/28/22 0324  WBC 9.7 8.0 6.5  LATICACIDVEN 3.3* 1.6  --    Acute blood loss anemia Hemoglobin normal at baseline.  Acutely low secondary to blood loss from long bone fracture as well as IntraOp blood loss.  She has not required any blood transfusion so far. Recent Labs    05/27/22 0136 05/27/22 0212 05/27/22 1523 05/28/22 0324  HGB 12.9 13.3 11.0* 8.7*  MCV 95.7  --  94.5 93.0    Normal anion gap metabolic acidosis Initial serum bicarb level was low at 17 in the setting of hypotension and lactic acidosis.  Improved since blood pressure normalized.  Renal function normal Recent Labs    05/27/22 0136 05/27/22 0212 05/27/22 0658 05/27/22 1523 05/28/22 0324  BUN '21 23 22  '$ --  18  CREATININE 0.63 0.70 0.58 0.53 0.56  CO2 17*  --  21*  --  25   Hyponatremia Sodium level suddenly low at 130.  No previous history of same.  No real symptoms associated with that. Recommended to follow-up with PCP and repeated as an outpatient. Recent Labs  Lab 05/27/22 0136 05/27/22 0212 05/27/22 0658 05/28/22 0324  NA 135 138 139 130*   Chronic alcohol use disorder Initial blood ethanol level 228, likely contributed to her fall.  Patient reports that she drinks twice a week.  The night of the incident, she had 4-5 drinks.   No significant withdrawal symptoms while in the hospital. Counseled to quit alcohol  GERD Continue PPI at  home  Wounds:  - Incision 04/25/12 Knee (Active)  Date First Assessed/Time First Assessed: 04/25/12 1243   Location: Knee    Assessments 04/25/2012  2:15 PM 04/25/2012  3:30 PM  Dressing Type Gauze (Comment);Compression wrap Gauze (Comment)  Dressing Clean;Dry;Intact Clean;Dry;Intact  Drainage Amount None None  Treatment -- Ice applied     No associated orders.     Incision 05/20/12 Knee Right (Active)   Date First Assessed/Time First Assessed: 05/20/12 1339   Location: Knee  Location Orientation: Right    Assessments 05/20/2012  2:01 PM 05/21/2012  8:15 AM  Dressing Type Gauze (Comment) Compression wrap  Dressing -- Clean;Dry;Intact  Site / Wound Assessment Clean;Dry Other (Comment)  Drainage Amount None None     No associated orders.     Incision (Closed) 04/28/21 Knee Left (Active)  Date First Assessed/Time First Assessed: 04/28/21 1523   Location: Knee  Location Orientation: Left    Assessments 04/28/2021  4:21 PM 05/02/2021 10:00 AM  Dressing Type Compression wrap Compression wrap  Dressing Clean;Dry;Intact Clean;Dry;Intact  Site / Wound Assessment Dressing in place / Unable to assess Dressing in place / Unable to assess  Drainage Amount -- None  Treatment Ice applied --     No associated orders.     Incision (Closed) 05/27/22 Thigh Left (Active)  Date First Assessed/Time First Assessed: 05/27/22 1203   Location: Thigh  Location Orientation: Left    Assessments 05/27/2022 12:15 PM 05/27/2022  9:36 PM  Dressing Type Other (Comment) Foam - Lift dressing to assess site every shift  Dressing Clean, Dry, Intact Clean, Dry, Intact  Dressing Change Frequency -- PRN  Site / Wound Assessment Dressing in place / Unable to assess Dressing in place / Unable to assess  Drainage Amount None None  Treatment -- Ice applied     No associated orders.    Discharge Exam:   Vitals:   05/27/22 1300 05/27/22 1320 05/27/22 2136 05/28/22 0741  BP: 123/68 124/66 112/70 (!) 101/58  Pulse: 80 79 99 77  Resp: '13 14 14   '$ Temp: 98.1 F (36.7 C)  98.5 F (36.9 C) 98.3 F (36.8 C)  TempSrc:   Oral Oral  SpO2: 98% 96% 98% 95%  Weight:      Height:        Body mass index is 29.07 kg/m.  General exam: Pleasant, elderly Caucasian female.  Not in distress Skin: No rashes, lesions or ulcers. HEENT: Atraumatic, normocephalic, no obvious bleeding Lungs: Clear to auscultation bilaterally CVS:  Regular rate and rhythm, no murmur GI/Abd soft, nontender, nondistended, bowel sound present CNS: Alert, awake, oriented x 3 Psychiatry: Mood appropriate Extremities: No pedal edema, no calf tenderness  Follow ups:    Follow-up Information     Haddix, Thomasene Lot, MD. Schedule an appointment as soon as possible for a visit in 2 week(s).   Specialty: Orthopedic Surgery Why: for wound check and repeat x-rays Contact information: [place] [address] [phone]         Bridget Hartshorn, NP Follow up.   Specialty: Adult Health Nurse Practitioner Contact information: [address] [place] [phone]                 Discharge Instructions:   Discharge Instructions     Call MD for:  difficulty breathing, headache or visual disturbances   Complete by: As directed    Call MD for:  extreme fatigue   Complete by: As  directed    Call MD for:  hives   Complete by: As directed    Call MD for:  persistant dizziness or light-headedness   Complete by: As directed    Call MD for:  persistant nausea and vomiting   Complete by: As directed    Call MD for:  severe uncontrolled pain   Complete by: As directed    Call MD for:  temperature >100.4   Complete by: As directed    Diet general   Complete by: As directed    Discharge instructions   Complete by: As directed    Recommendations at discharge:   Outpatient PT recommended by physical therapy  Judicious use of pain medicines  DVT prophylaxis with Eliquis for 30 days per orthopedics  Follow-up with PCP as an outpatient for repeat sodium level in 1 week  General discharge instructions: Follow with Primary MD Lissa Hoard Karie Schwalbe, NP in 7 days  Please request your PCP  to go over your hospital tests, procedures, radiology results at the follow up. Please get your medicines reviewed and adjusted.  Your PCP may decide to repeat certain labs or tests as needed. Do  not drive, operate heavy machinery, perform activities at heights, swimming or participation in water activities or provide baby sitting services if your were admitted for syncope or siezures until you have seen by Primary MD or a Neurologist and advised to do so again. [place] Controlled Substance Reporting System database was reviewed. Do not drive, operate heavy machinery, perform activities at heights, swim, participate in water activities or provide baby-sitting services while on medications for pain, sleep and mood until your outpatient physician has reevaluated you and advised to do so again.  You are strongly recommended to comply with the dose, frequency and duration of prescribed medications. Activity: As tolerated with Full fall precautions use walker/cane & assistance as needed Avoid using any recreational substances like cigarette, tobacco, alcohol, or non-prescribed drug. If you experience worsening of your admission symptoms, develop shortness of breath, life threatening emergency, suicidal or homicidal thoughts you must seek medical attention immediately by calling 911 or calling your MD immediately  if symptoms less severe. You must read complete instructions/literature along with all the possible adverse reactions/side effects for all the medicines you take and that have been prescribed to you. Take any new medicine only after you have completely understood and accepted all the possible adverse reactions/side effects.  Wear Seat belts while driving. You were cared for by a hospitalist during your hospital stay. If you have any questions about your discharge medications or the care you received while you were in the hospital after you are discharged, you can call the unit and ask to speak with the hospitalist or the covering physician. Once you are discharged, your primary care physician will handle any further medical issues. Please note that NO REFILLS for any discharge medications  will be authorized once you are discharged, as it is imperative that you return to your primary care physician (or establish a relationship with a primary care physician if you do not have one).   Increase activity slowly   Complete by: As directed        Discharge Medications:   Allergies as of 05/28/2022       Reactions   Shellfish Allergy Hives, Swelling   Pt States betadine ok   Bee Venom Swelling   Crab Extract Allergy Skin Test Hives, Swelling        Medication  List     STOP taking these medications    aspirin EC 81 MG tablet   meloxicam 15 MG tablet Commonly known as: MOBIC       TAKE these medications    acetaminophen 325 MG tablet Commonly known as: TYLENOL Take 1-2 tablets (325-650 mg total) by mouth every 6 (six) hours as needed for mild pain (pain score 1-3 or temp > 100.5).   apixaban 2.5 MG Tabs tablet Commonly known as: Eliquis Take 1 tablet (2.5 mg total) by mouth 2 (two) times daily.   diclofenac Sodium 1 % Gel Commonly known as: VOLTAREN Apply 1 application topically daily as needed (pain).   fluticasone 50 MCG/ACT nasal spray Commonly known as: FLONASE SPRAY TWO SPRAYS IN EACH NOSTRIL ONCE DAILY What changed: See the new instructions.   HYDROcodone-acetaminophen 5-325 MG tablet Commonly known as: NORCO/VICODIN Take 1-2 tablets by mouth every 4 (four) hours as needed for severe pain or moderate pain (1 tablet moderate pain, 2 tablets severe pain).   lansoprazole 15 MG capsule Commonly known as: PREVACID TAKE 1 CAPSULE DAILY AT 12 NOON What changed: See the new instructions.   methocarbamol 500 MG tablet Commonly known as: ROBAXIN Take 1 tablet (500 mg total) by mouth every 6 (six) hours as needed for muscle spasms.   multivitamin with minerals tablet Take 1 tablet by mouth daily.   valACYclovir 500 MG tablet Commonly known as: VALTREX TAKE ONE TABLET BY MOUTH ONCE WEEKLY WHICH PREVENTS OUTBREAKS OF HSV What changed:  how much to  take how to take this when to take this reasons to take this additional instructions         The results of significant diagnostics from this hospitalization (including imaging, microbiology, ancillary and laboratory) are listed below for reference.    Procedures and Diagnostic Studies:   DG FEMUR PORT MIN 2 VIEWS LEFT  Result Date: 05/27/2022 CLINICAL DATA:  Postoperative.  Fracture. EXAM: LEFT FEMUR PORTABLE 2 VIEWS COMPARISON:  Left femur radiographs 05/27/2022 FINDINGS: Interval long cephalomedullary nail fixation of the previously seen displaced and angulated fracture of the proximal left femoral diaphysis. Improved now near anatomic alignment. No hardware complication is seen. Redemonstration of total left knee arthroplasty and remote distal femoral metaphyseal screw possibly from more remote ACL reconstruction. Expected postoperative subcutaneous air lateral to the left pelvis and hip. IMPRESSION: Interval cephalomedullary nail fixation of the previously seen displaced and angulated fracture of the proximal left femoral diaphysis. Improved alignment. Electronically Signed   By: Yvonne Kendall M.D.   On: 05/27/2022 13:02   DG FEMUR MIN 2 VIEWS LEFT  Result Date: 05/27/2022 CLINICAL DATA:  Intramedullary nail fixation EXAM: LEFT FEMUR 2 VIEWS COMPARISON:  05/27/2022 FLUOROSCOPY: Air kerma 20.67 mGy FINDINGS: Intraoperative fluoroscopic images of the left hip demonstrate intramedullary nail fixation of subtrochanteric fracture of the proximal left femur. Near anatomic alignment with no obvious perihardware fracture or component malpositioning. IMPRESSION: Intraoperative fluoroscopic images of the left hip demonstrate intramedullary nail fixation of subtrochanteric fracture of the proximal left femur. Near anatomic alignment with no obvious perihardware fracture or component malpositioning. Electronically Signed   By: Delanna Ahmadi M.D.   On: 05/27/2022 12:24   DG C-Arm 1-60 Min-No  Report  Result Date: 05/27/2022 Fluoroscopy was utilized by the requesting physician.  No radiographic interpretation.   DG C-Arm 1-60 Min-No Report  Result Date: 05/27/2022 Fluoroscopy was utilized by the requesting physician.  No radiographic interpretation.   DG Pelvis Portable  Result Date: 05/27/2022 CLINICAL  DATA:  Trauma to the left lower extremity. EXAM: PORTABLE PELVIS 1-2 VIEWS; LEFT FEMUR 2 VIEWS; LEFT TIBIA AND FIBULA - 2 VIEW COMPARISON:  CT of the abdomen pelvis dated 06/17/2013. FINDINGS: There is a displaced oblique fracture of the proximal left femoral diaphysis extending from the subtrochanteric region into the proximal femoral shaft. There is lateral displacement of the distal fracture fragment with mild Verus angulation. There is proximal migration of the femoral diaphysis resulting in a degree of overlap of the fracture fragments. No dislocation. The bones are mildly osteopenic. There is a total left knee arthroplasty. The arthroplasty components appear intact and in anatomic alignment. Evaluation of the tibia and fibula is limited due to overlying support apparatus. The soft tissues are grossly unremarkable. IMPRESSION: 1. Displaced and angulated fracture of the proximal left femoral diaphysis. 2. Total left knee arthroplasty. Electronically Signed   By: Anner Crete M.D.   On: 05/27/2022 02:36   DG Femur Min 2 Views Left  Result Date: 05/27/2022 CLINICAL DATA:  Trauma to the left lower extremity. EXAM: PORTABLE PELVIS 1-2 VIEWS; LEFT FEMUR 2 VIEWS; LEFT TIBIA AND FIBULA - 2 VIEW COMPARISON:  CT of the abdomen pelvis dated 06/17/2013. FINDINGS: There is a displaced oblique fracture of the proximal left femoral diaphysis extending from the subtrochanteric region into the proximal femoral shaft. There is lateral displacement of the distal fracture fragment with mild Verus angulation. There is proximal migration of the femoral diaphysis resulting in a degree of overlap of the  fracture fragments. No dislocation. The bones are mildly osteopenic. There is a total left knee arthroplasty. The arthroplasty components appear intact and in anatomic alignment. Evaluation of the tibia and fibula is limited due to overlying support apparatus. The soft tissues are grossly unremarkable. IMPRESSION: 1. Displaced and angulated fracture of the proximal left femoral diaphysis. 2. Total left knee arthroplasty. Electronically Signed   By: Anner Crete M.D.   On: 05/27/2022 02:36   DG Tibia/Fibula Left  Result Date: 05/27/2022 CLINICAL DATA:  Trauma to the left lower extremity. EXAM: PORTABLE PELVIS 1-2 VIEWS; LEFT FEMUR 2 VIEWS; LEFT TIBIA AND FIBULA - 2 VIEW COMPARISON:  CT of the abdomen pelvis dated 06/17/2013. FINDINGS: There is a displaced oblique fracture of the proximal left femoral diaphysis extending from the subtrochanteric region into the proximal femoral shaft. There is lateral displacement of the distal fracture fragment with mild Verus angulation. There is proximal migration of the femoral diaphysis resulting in a degree of overlap of the fracture fragments. No dislocation. The bones are mildly osteopenic. There is a total left knee arthroplasty. The arthroplasty components appear intact and in anatomic alignment. Evaluation of the tibia and fibula is limited due to overlying support apparatus. The soft tissues are grossly unremarkable. IMPRESSION: 1. Displaced and angulated fracture of the proximal left femoral diaphysis. 2. Total left knee arthroplasty. Electronically Signed   By: Anner Crete M.D.   On: 05/27/2022 02:36   DG Chest Port 1 View  Result Date: 05/27/2022 CLINICAL DATA:  Trauma. EXAM: PORTABLE CHEST 1 VIEW COMPARISON:  Chest radiograph dated 05/20/2012. FINDINGS: No focal consolidation, pleural effusion, pneumothorax. The cardiac silhouette is within normal limits. No acute osseous pathology. IMPRESSION: No active disease. Electronically Signed   By: Anner Crete M.D.   On: 05/27/2022 02:33     Labs:   Basic Metabolic Panel: Recent Labs  Lab 05/27/22 0136 05/27/22 0212 05/27/22 0658 05/27/22 1523 05/28/22 0324  NA 135 138 139  --  130*  K 3.8 3.9 4.2  --  4.0  CL 105 111 107  --  101  CO2 17*  --  21*  --  25  GLUCOSE 94 88 88  --  177*  BUN '21 23 22  '$ --  18  CREATININE 0.63 0.70 0.58 0.53 0.56  CALCIUM 8.4*  --  8.5*  --  8.0*   GFR Estimated Creatinine Clearance: 73.5 mL/min (by C-G formula based on SCr of 0.56 mg/dL). Liver Function Tests: Recent Labs  Lab 05/27/22 0136  AST 29  ALT 24  ALKPHOS 69  BILITOT 0.4  PROT 6.3*  ALBUMIN 3.7   No results for input(s): "LIPASE", "AMYLASE" in the last 168 hours. No results for input(s): "AMMONIA" in the last 168 hours. Coagulation profile Recent Labs  Lab 05/27/22 0136  INR 1.0    CBC: Recent Labs  Lab 05/27/22 0136 05/27/22 0212 05/27/22 1523 05/28/22 0324  WBC 9.7  --  8.0 6.5  HGB 12.9 13.3 11.0* 8.7*  HCT 40.2 39.0 34.5* 26.6*  MCV 95.7  --  94.5 93.0  PLT 217  --  207 193   Cardiac Enzymes: No results for input(s): "CKTOTAL", "CKMB", "CKMBINDEX", "TROPONINI" in the last 168 hours. BNP: Invalid input(s): "POCBNP" CBG: No results for input(s): "GLUCAP" in the last 168 hours. D-Dimer No results for input(s): "DDIMER" in the last 72 hours. Hgb A1c No results for input(s): "HGBA1C" in the last 72 hours. Lipid Profile No results for input(s): "CHOL", "HDL", "LDLCALC", "TRIG", "CHOLHDL", "LDLDIRECT" in the last 72 hours. Thyroid function studies No results for input(s): "TSH", "T4TOTAL", "T3FREE", "THYROIDAB" in the last 72 hours.  Invalid input(s): "FREET3" Anemia work up No results for input(s): "VITAMINB12", "FOLATE", "FERRITIN", "TIBC", "IRON", "RETICCTPCT" in the last 72 hours. Microbiology No results found for this or any previous visit (from the past 240 hour(s)).  Time coordinating discharge: 35 minutes  Signed: Herman Mell  [facility] 05/28/2022, 1:54 PM

## 2022-05-28 NOTE — Discharge Instructions (Signed)
[facility] Discharge Instructions   General Discharge Instructions  WEIGHT BEARING STATUS:Weightbearing as tolerated  RANGE OF MOTION/ACTIVITY: Ok for range of motion as tolerated  Wound Care: You may remove your surgical dressing on Tuesday, (05/29/22). Incisions can be left open to air if there is no drainage. Once the incision is completely dry and without drainage, it may be left open to air out.  Showering may begin Wednesday, (05/30/22).  Clean incision gently with soap and water.  DVT/PE prophylaxis:  Eliquis 2.5 twice daily x 30 days  Diet: as you were eating previously.  Can use over the counter stool softeners and bowel preparations, such as Miralax, to help with bowel movements.  Narcotics can be constipating.  Be sure to drink plenty of fluids  PAIN MEDICATION USE AND EXPECTATIONS  You have likely been given narcotic medications to help control your pain.  After a traumatic event that results in an fracture (broken bone) with or without surgery, it is ok to use narcotic pain medications to help control one's pain.  We understand that everyone responds to pain differently and each individual patient will be evaluated on a regular basis for the continued need for narcotic medications. Ideally, narcotic medication use should last no more than 6-8 weeks (coinciding with fracture healing).   As a patient it is your responsibility as well to monitor narcotic medication use and report the amount and frequency you use these medications when you come to your office visit.   We would also advise that if you are using narcotic medications, you should take a dose prior to therapy to maximize you participation.  IF YOU ARE ON NARCOTIC MEDICATIONS IT IS NOT PERMISSIBLE TO OPERATE A MOTOR VEHICLE (MOTORCYCLE/CAR/TRUCK/MOPED) OR HEAVY MACHINERY DO NOT MIX NARCOTICS WITH OTHER CNS (CENTRAL NERVOUS SYSTEM) DEPRESSANTS SUCH AS ALCOHOL   STOP SMOKING OR USING NICOTINE  PRODUCTS!!!!  As discussed nicotine severely impairs your body's ability to heal surgical and traumatic wounds but also impairs bone healing.  Wounds and bone heal by forming microscopic blood vessels (angiogenesis) and nicotine is a vasoconstrictor (essentially, shrinks blood vessels).  Therefore, if vasoconstriction occurs to these microscopic blood vessels they essentially disappear and are unable to deliver necessary nutrients to the healing tissue.  This is one modifiable factor that you can do to dramatically increase your chances of healing your injury.    (This means no smoking, no nicotine gum, patches, etc)  DO NOT USE NONSTEROIDAL ANTI-INFLAMMATORY DRUGS (NSAID'S)  Using products such as Advil (ibuprofen), Aleve (naproxen), Motrin (ibuprofen) for additional pain control during fracture healing can delay and/or prevent the healing response.  If you would like to take over the counter (OTC) medication, Tylenol (acetaminophen) is ok.  However, some narcotic medications that are given for pain control contain acetaminophen as well. Therefore, you should not exceed more than 4000 mg of tylenol in a day if you do not have liver disease.  Also note that there are may OTC medicines, such as cold medicines and allergy medicines that my contain tylenol as well.  If you have any questions about medications and/or interactions please ask your doctor/PA or your pharmacist.      ICE AND ELEVATE INJURED/OPERATIVE EXTREMITY  Using ice and elevating the injured extremity above your heart can help with swelling and pain control.  Icing in a pulsatile fashion, such as 20 minutes on and 20 minutes off, can be followed.    Do not place ice directly on skin. Make  sure there is a barrier between to skin and the ice pack.    Using frozen items such as frozen peas works well as the conform nicely to the are that needs to be iced.  USE AN ACE WRAP OR TED HOSE FOR SWELLING CONTROL  In addition to icing and elevation,  Ace wraps or TED hose are used to help limit and resolve swelling.  It is recommended to use Ace wraps or TED hose until you are informed to stop.    When using Ace Wraps start the wrapping distally (farthest away from the body) and wrap proximally (closer to the body)   Example: If you had surgery on your leg or thing and you do not have a splint on, start the ace wrap at the toes and work your way up to the thigh        If you had surgery on your upper extremity and do not have a splint on, start the ace wrap at your fingers and work your way up to the upper arm  [place]: [phone]   VISIT OUR WEBSITE FOR ADDITIONAL INFORMATION: [email or web address]    Discharge Wound Care Instructions  Do NOT apply any ointments, solutions or lotions to pin sites or surgical wounds.  These prevent needed drainage and even though solutions like hydrogen peroxide kill bacteria, they also damage cells lining the pin sites that help fight infection.  Applying lotions or ointments can keep the wounds moist and can cause them to breakdown and open up as well. This can increase the risk for infection. When in doubt call the office.  If any drainage is noted, use foam dressing - These dressing supplies should be available at local medical supply stores [facility], [facility], etc) as well as Management consultant ([facility], [facility], [employer], etc)  Once the incision is completely dry and without drainage, it may be left open to air out.  Showering may begin 36-48 hours later.  Cleaning gently with soap and water.

## 2022-05-28 NOTE — Progress Notes (Signed)
Orthopaedic Trauma Progress Note  SUBJECTIVE: Doing well this morning. Pain controlled. No chest pain. No SOB. No nausea/vomiting. No other complaints. Has been up to the bathroom but not worked with therapies yesterday.   OBJECTIVE:  Vitals:   05/27/22 2136 05/28/22 0741  BP: 112/70 (!) 101/58  Pulse: 99 77  Resp: 14   Temp: 98.5 F (36.9 C) 98.3 F (36.8 C)  SpO2: 98% 95%    General: Sitting up in bed, NAD Respiratory: No increased work of breathing.  LLE: Dressings CDI. No significant tenderness with palpation throughout hip. Swelling stable. Discomfort in the hip with knee motion. Ankle DF/PF intact. Extremity warm and well perfused. +DP pulse.   IMAGING: Stable post op imaging.   LABS:  Results for orders placed or performed during the hospital encounter of 05/27/22 (from the past 24 hour(s))  Lactic acid, plasma     Status: None   Collection Time: 05/27/22  3:23 PM  Result Value Ref Range   Lactic Acid, Venous 1.6 0.5 - 1.9 mmol/L  CBC     Status: Abnormal   Collection Time: 05/27/22  3:23 PM  Result Value Ref Range   WBC 8.0 4.0 - 10.5 K/uL   RBC 3.65 (L) 3.87 - 5.11 MIL/uL   Hemoglobin 11.0 (L) 12.0 - 15.0 g/dL   HCT 34.5 (L) 36.0 - 46.0 %   MCV 94.5 80.0 - 100.0 fL   MCH 30.1 26.0 - 34.0 pg   MCHC 31.9 30.0 - 36.0 g/dL   RDW 14.3 11.5 - 15.5 %   Platelets 207 150 - 400 K/uL   nRBC 0.0 0.0 - 0.2 %  Creatinine, serum     Status: None   Collection Time: 05/27/22  3:23 PM  Result Value Ref Range   Creatinine, Ser 0.53 0.44 - 1.00 mg/dL   GFR, Estimated >60 >60 mL/min  CBC     Status: Abnormal   Collection Time: 05/28/22  3:24 AM  Result Value Ref Range   WBC 6.5 4.0 - 10.5 K/uL   RBC 2.86 (L) 3.87 - 5.11 MIL/uL   Hemoglobin 8.7 (L) 12.0 - 15.0 g/dL   HCT 26.6 (L) 36.0 - 46.0 %   MCV 93.0 80.0 - 100.0 fL   MCH 30.4 26.0 - 34.0 pg   MCHC 32.7 30.0 - 36.0 g/dL   RDW 14.2 11.5 - 15.5 %   Platelets 193 150 - 400 K/uL   nRBC 0.0 0.0 - 0.2 %  Basic metabolic  panel     Status: Abnormal   Collection Time: 05/28/22  3:24 AM  Result Value Ref Range   Sodium 130 (L) 135 - 145 mmol/L   Potassium 4.0 3.5 - 5.1 mmol/L   Chloride 101 98 - 111 mmol/L   CO2 25 22 - 32 mmol/L   Glucose, Bld 177 (H) 70 - 99 mg/dL   BUN 18 8 - 23 mg/dL   Creatinine, Ser 0.56 0.44 - 1.00 mg/dL   Calcium 8.0 (L) 8.9 - 10.3 mg/dL   GFR, Estimated >60 >60 mL/min   Anion gap 4 (L) 5 - 15    ASSESSMENT: Julia Murray is a 67 y.o. female, 1 Day Post-Op s/p INTRAMEDULLARY NAIL LEFT SUBTROCHANTERIC FEMUR FRACTURE  CV/Blood loss: Acute blood loss anemia, Hgb 8.7 this AM. Hemodynamically stable  PLAN: Weightbearing: WBAT LLE ROM:  Ok for ROM as tolerated  Incisional and dressing care: Reinforce dressings as needed  Showering: Ok to begin getting incisions wet 05/30/22 Orthopedic device(s):  None  Pain management:  1. Tylenol 325-650 mg q 6 hours PRN 2. Robaxin 500 mg q 6 hours PRN 3. Norco 5-325 mg OR Norco 7.5-325 mg q 4 hours PRN 4. Dilaudid 1 mg q 4 hours PRN 5. Meloxicam 15 mg daily VTE prophylaxis: Lovenox, SCDs ID:  Ancef 2gm post op Foley/Lines:  No foley, KVO IVFs Impediments to Fracture Healing: Vit D level pending, will start supplementation as indicated Dispo: PT/OT eval today. Dispo pending.  Hyponatremia correction per primary team  D/C recommendations: - Norco and Robaxin for pain control - Eliquis 2.5 mg BID x 30 days for DVT prophylaxis - Possible need for Vit D supplementation  Follow - up plan: 2 weeks after d/c for wound check and repeat x-rays   Contact information:  Katha Hamming MD, Rushie Nyhan PA-C. After hours and holidays please check [email or web address] for group call information for [facility]   Gwinda Passe, PA-C [phone] (office) [email or web address]

## 2022-05-28 NOTE — Care Management CC44 (Signed)
Condition Code 44 Documentation Completed  Patient Details  Name: Julia Murray MRN: [id number] Date of Birth: [birth year]   Condition Code 44 given:  Yes Patient signature on Condition Code 44 notice:  Yes Documentation of 2 MD's agreement:  Yes Code 44 added to claim:  Yes    Bartholomew Crews, RN 05/28/2022, 2:24 PM

## 2022-05-28 NOTE — Discharge Summary (Signed)
Physician Discharge Summary  [name] FIE:[id number] DOB: 1955/07/04 DOA: 05/27/2022  PCP: Bridget Hartshorn, NP  Admit date: 05/27/2022 Discharge date: 05/28/2022  Admitted From: Home Discharge disposition: Home with outpatient PT  Recommendations at discharge:  Outpatient PT recommended by physical therapy Judicious use of pain medicines DVT prophylaxis with Eliquis for 30 days per orthopedics Follow-up with PCP as an outpatient for repeat sodium level in 1 week   Brief narrative: Julia Murray is a 67 y.o. female with PMH significant for chronic alcohol use, HLD, GERD, arthritis. 1/21, patient presented to the ED after a fall with immediate left leg pain and deformity.  Apparently she was under the influence of alcohol at the time. EMS noted low blood pressure at 82/40 which improved with IV fluid In the ED, blood alcohol level was elevated to 228, lactic acid level was elevated at 3.3 X-ray pelvis showed displaced and angulated fracture of the proximal left femoral diaphysis Orthopedics consulted Admitted to [facility] 1/21, underwent intramedullary nailing of the left femur  Subjective: Patient was seen and examined this morning.  Pleasant elderly Caucasian female.  Propped up in bed.  Not in distress.  Pain controlled.  Reports she has been able to be up and walking to the bathroom.  Assessment and plan: Left proximal femur fracture Secondary to a fall Orthopedics consulted. 1/21, underwent intramedullary nailing of left femur Per orthopedics, pain management with Norco as needed, Robaxin as needed. DVT prophylaxis with Eliquis twice daily for 30 days  HypOtension Blood pressure was initially low at 82/40 per EMS.  It is probably because of blood loss from long bone fracture.  Blood pressure improved with IV hydration.  Currently stable PTA, patient was not on any antihypertensives.  Lactic acidosis No evidence of infection but initial lactic acid level was elevated  to 3.3.  Probably from transient hypotension as well as use of alcohol.  Lactic acid level has normalized Recent Labs  Lab 05/27/22 0136 05/27/22 1523 05/28/22 0324  WBC 9.7 8.0 6.5  LATICACIDVEN 3.3* 1.6  --    Acute blood loss anemia Hemoglobin normal at baseline.  Acutely low secondary to blood loss from long bone fracture as well as IntraOp blood loss.  She has not required any blood transfusion so far. Recent Labs    05/27/22 0136 05/27/22 0212 05/27/22 1523 05/28/22 0324  HGB 12.9 13.3 11.0* 8.7*  MCV 95.7  --  94.5 93.0    Normal anion gap metabolic acidosis Initial serum bicarb level was low at 17 in the setting of hypotension and lactic acidosis.  Improved since blood pressure normalized.  Renal function normal Recent Labs    05/27/22 0136 05/27/22 0212 05/27/22 0658 05/27/22 1523 05/28/22 0324  BUN '21 23 22  '$ --  18  CREATININE 0.63 0.70 0.58 0.53 0.56  CO2 17*  --  21*  --  25   Hyponatremia Sodium level suddenly low at 130.  No previous history of same.  No real symptoms associated with that. Recommended to follow-up with PCP and repeated as an outpatient. Recent Labs  Lab 05/27/22 0136 05/27/22 0212 05/27/22 0658 05/28/22 0324  NA 135 138 139 130*   Chronic alcohol use disorder Initial blood ethanol level 228, likely contributed to her fall.  Patient reports that she drinks twice a week.  The night of the incident, she had 4-5 drinks.   No significant withdrawal symptoms while in the hospital. Counseled to quit alcohol  GERD Continue PPI at  home  Wounds:  - Incision 04/25/12 Knee (Active)  Date First Assessed/Time First Assessed: 04/25/12 1243   Location: Knee    Assessments 04/25/2012  2:15 PM 04/25/2012  3:30 PM  Dressing Type Gauze (Comment);Compression wrap Gauze (Comment)  Dressing Clean;Dry;Intact Clean;Dry;Intact  Drainage Amount None None  Treatment -- Ice applied     No associated orders.     Incision 05/20/12 Knee Right (Active)   Date First Assessed/Time First Assessed: 05/20/12 1339   Location: Knee  Location Orientation: Right    Assessments 05/20/2012  2:01 PM 05/21/2012  8:15 AM  Dressing Type Gauze (Comment) Compression wrap  Dressing -- Clean;Dry;Intact  Site / Wound Assessment Clean;Dry Other (Comment)  Drainage Amount None None     No associated orders.     Incision (Closed) 04/28/21 Knee Left (Active)  Date First Assessed/Time First Assessed: 04/28/21 1523   Location: Knee  Location Orientation: Left    Assessments 04/28/2021  4:21 PM 05/02/2021 10:00 AM  Dressing Type Compression wrap Compression wrap  Dressing Clean;Dry;Intact Clean;Dry;Intact  Site / Wound Assessment Dressing in place / Unable to assess Dressing in place / Unable to assess  Drainage Amount -- None  Treatment Ice applied --     No associated orders.     Incision (Closed) 05/27/22 Thigh Left (Active)  Date First Assessed/Time First Assessed: 05/27/22 1203   Location: Thigh  Location Orientation: Left    Assessments 05/27/2022 12:15 PM 05/27/2022  9:36 PM  Dressing Type Other (Comment) Foam - Lift dressing to assess site every shift  Dressing Clean, Dry, Intact Clean, Dry, Intact  Dressing Change Frequency -- PRN  Site / Wound Assessment Dressing in place / Unable to assess Dressing in place / Unable to assess  Drainage Amount None None  Treatment -- Ice applied     No associated orders.    Discharge Exam:   Vitals:   05/27/22 1300 05/27/22 1320 05/27/22 2136 05/28/22 0741  BP: 123/68 124/66 112/70 (!) 101/58  Pulse: 80 79 99 77  Resp: '13 14 14   '$ Temp: 98.1 F (36.7 C)  98.5 F (36.9 C) 98.3 F (36.8 C)  TempSrc:   Oral Oral  SpO2: 98% 96% 98% 95%  Weight:      Height:        Body mass index is 29.07 kg/m.  General exam: Pleasant, elderly Caucasian female.  Not in distress Skin: No rashes, lesions or ulcers. HEENT: Atraumatic, normocephalic, no obvious bleeding Lungs: Clear to auscultation bilaterally CVS:  Regular rate and rhythm, no murmur GI/Abd soft, nontender, nondistended, bowel sound present CNS: Alert, awake, oriented x 3 Psychiatry: Mood appropriate Extremities: No pedal edema, no calf tenderness  Follow ups:    Follow-up Information     Haddix, Thomasene Lot, MD. Schedule an appointment as soon as possible for a visit in 2 week(s).   Specialty: Orthopedic Surgery Why: for wound check and repeat x-rays Contact information: [place] [address] [phone]         Bridget Hartshorn, NP Follow up.   Specialty: Adult Health Nurse Practitioner Contact information: [place] [id number] [phone]                 Discharge Instructions:   Discharge Instructions     Call MD for:  difficulty breathing, headache or visual disturbances   Complete by: As directed    Call MD for:  extreme fatigue   Complete by: As  directed    Call MD for:  hives   Complete by: As directed    Call MD for:  persistant dizziness or light-headedness   Complete by: As directed    Call MD for:  persistant nausea and vomiting   Complete by: As directed    Call MD for:  severe uncontrolled pain   Complete by: As directed    Call MD for:  temperature >100.4   Complete by: As directed    Diet general   Complete by: As directed    Discharge instructions   Complete by: As directed    Recommendations at discharge:   Outpatient PT recommended by physical therapy  Judicious use of pain medicines  DVT prophylaxis with Eliquis for 30 days per orthopedics  Follow-up with PCP as an outpatient for repeat sodium level in 1 week  General discharge instructions: Follow with Primary MD Lissa Hoard Karie Schwalbe, NP in 7 days  Please request your PCP  to go over your hospital tests, procedures, radiology results at the follow up. Please get your medicines reviewed and adjusted.  Your PCP may decide to repeat certain labs or tests as needed. Do  not drive, operate heavy machinery, perform activities at heights, swimming or participation in water activities or provide baby sitting services if your were admitted for syncope or siezures until you have seen by Primary MD or a Neurologist and advised to do so again. [place] Controlled Substance Reporting System database was reviewed. Do not drive, operate heavy machinery, perform activities at heights, swim, participate in water activities or provide baby-sitting services while on medications for pain, sleep and mood until your outpatient physician has reevaluated you and advised to do so again.  You are strongly recommended to comply with the dose, frequency and duration of prescribed medications. Activity: As tolerated with Full fall precautions use walker/cane & assistance as needed Avoid using any recreational substances like cigarette, tobacco, alcohol, or non-prescribed drug. If you experience worsening of your admission symptoms, develop shortness of breath, life threatening emergency, suicidal or homicidal thoughts you must seek medical attention immediately by calling 911 or calling your MD immediately  if symptoms less severe. You must read complete instructions/literature along with all the possible adverse reactions/side effects for all the medicines you take and that have been prescribed to you. Take any new medicine only after you have completely understood and accepted all the possible adverse reactions/side effects.  Wear Seat belts while driving. You were cared for by a hospitalist during your hospital stay. If you have any questions about your discharge medications or the care you received while you were in the hospital after you are discharged, you can call the unit and ask to speak with the hospitalist or the covering physician. Once you are discharged, your primary care physician will handle any further medical issues. Please note that NO REFILLS for any discharge medications  will be authorized once you are discharged, as it is imperative that you return to your primary care physician (or establish a relationship with a primary care physician if you do not have one).   Increase activity slowly   Complete by: As directed        Discharge Medications:   Allergies as of 05/28/2022       Reactions   Shellfish Allergy Hives, Swelling   Pt States betadine ok   Bee Venom Swelling   Crab Extract Allergy Skin Test Hives, Swelling        Medication  List     STOP taking these medications    aspirin EC 81 MG tablet   meloxicam 15 MG tablet Commonly known as: MOBIC       TAKE these medications    acetaminophen 325 MG tablet Commonly known as: TYLENOL Take 1-2 tablets (325-650 mg total) by mouth every 6 (six) hours as needed for mild pain (pain score 1-3 or temp > 100.5).   apixaban 2.5 MG Tabs tablet Commonly known as: Eliquis Take 1 tablet (2.5 mg total) by mouth 2 (two) times daily.   diclofenac Sodium 1 % Gel Commonly known as: VOLTAREN Apply 1 application topically daily as needed (pain).   fluticasone 50 MCG/ACT nasal spray Commonly known as: FLONASE SPRAY TWO SPRAYS IN EACH NOSTRIL ONCE DAILY What changed: See the new instructions.   HYDROcodone-acetaminophen 5-325 MG tablet Commonly known as: NORCO/VICODIN Take 1-2 tablets by mouth every 4 (four) hours as needed for severe pain or moderate pain (1 tablet moderate pain, 2 tablets severe pain).   lansoprazole 15 MG capsule Commonly known as: PREVACID TAKE 1 CAPSULE DAILY AT 12 NOON What changed: See the new instructions.   methocarbamol 500 MG tablet Commonly known as: ROBAXIN Take 1 tablet (500 mg total) by mouth every 6 (six) hours as needed for muscle spasms.   multivitamin with minerals tablet Take 1 tablet by mouth daily.   valACYclovir 500 MG tablet Commonly known as: VALTREX TAKE ONE TABLET BY MOUTH ONCE WEEKLY WHICH PREVENTS OUTBREAKS OF HSV What changed:  how much to  take how to take this when to take this reasons to take this additional instructions         The results of significant diagnostics from this hospitalization (including imaging, microbiology, ancillary and laboratory) are listed below for reference.    Procedures and Diagnostic Studies:   DG FEMUR PORT MIN 2 VIEWS LEFT  Result Date: 05/27/2022 CLINICAL DATA:  Postoperative.  Fracture. EXAM: LEFT FEMUR PORTABLE 2 VIEWS COMPARISON:  Left femur radiographs 05/27/2022 FINDINGS: Interval long cephalomedullary nail fixation of the previously seen displaced and angulated fracture of the proximal left femoral diaphysis. Improved now near anatomic alignment. No hardware complication is seen. Redemonstration of total left knee arthroplasty and remote distal femoral metaphyseal screw possibly from more remote ACL reconstruction. Expected postoperative subcutaneous air lateral to the left pelvis and hip. IMPRESSION: Interval cephalomedullary nail fixation of the previously seen displaced and angulated fracture of the proximal left femoral diaphysis. Improved alignment. Electronically Signed   By: Yvonne Kendall M.D.   On: 05/27/2022 13:02   DG FEMUR MIN 2 VIEWS LEFT  Result Date: 05/27/2022 CLINICAL DATA:  Intramedullary nail fixation EXAM: LEFT FEMUR 2 VIEWS COMPARISON:  05/27/2022 FLUOROSCOPY: Air kerma 20.67 mGy FINDINGS: Intraoperative fluoroscopic images of the left hip demonstrate intramedullary nail fixation of subtrochanteric fracture of the proximal left femur. Near anatomic alignment with no obvious perihardware fracture or component malpositioning. IMPRESSION: Intraoperative fluoroscopic images of the left hip demonstrate intramedullary nail fixation of subtrochanteric fracture of the proximal left femur. Near anatomic alignment with no obvious perihardware fracture or component malpositioning. Electronically Signed   By: Delanna Ahmadi M.D.   On: 05/27/2022 12:24   DG C-Arm 1-60 Min-No  Report  Result Date: 05/27/2022 Fluoroscopy was utilized by the requesting physician.  No radiographic interpretation.   DG C-Arm 1-60 Min-No Report  Result Date: 05/27/2022 Fluoroscopy was utilized by the requesting physician.  No radiographic interpretation.   DG Pelvis Portable  Result Date: 05/27/2022 CLINICAL  DATA:  Trauma to the left lower extremity. EXAM: PORTABLE PELVIS 1-2 VIEWS; LEFT FEMUR 2 VIEWS; LEFT TIBIA AND FIBULA - 2 VIEW COMPARISON:  CT of the abdomen pelvis dated 06/17/2013. FINDINGS: There is a displaced oblique fracture of the proximal left femoral diaphysis extending from the subtrochanteric region into the proximal femoral shaft. There is lateral displacement of the distal fracture fragment with mild Verus angulation. There is proximal migration of the femoral diaphysis resulting in a degree of overlap of the fracture fragments. No dislocation. The bones are mildly osteopenic. There is a total left knee arthroplasty. The arthroplasty components appear intact and in anatomic alignment. Evaluation of the tibia and fibula is limited due to overlying support apparatus. The soft tissues are grossly unremarkable. IMPRESSION: 1. Displaced and angulated fracture of the proximal left femoral diaphysis. 2. Total left knee arthroplasty. Electronically Signed   By: Anner Crete M.D.   On: 05/27/2022 02:36   DG Femur Min 2 Views Left  Result Date: 05/27/2022 CLINICAL DATA:  Trauma to the left lower extremity. EXAM: PORTABLE PELVIS 1-2 VIEWS; LEFT FEMUR 2 VIEWS; LEFT TIBIA AND FIBULA - 2 VIEW COMPARISON:  CT of the abdomen pelvis dated 06/17/2013. FINDINGS: There is a displaced oblique fracture of the proximal left femoral diaphysis extending from the subtrochanteric region into the proximal femoral shaft. There is lateral displacement of the distal fracture fragment with mild Verus angulation. There is proximal migration of the femoral diaphysis resulting in a degree of overlap of the  fracture fragments. No dislocation. The bones are mildly osteopenic. There is a total left knee arthroplasty. The arthroplasty components appear intact and in anatomic alignment. Evaluation of the tibia and fibula is limited due to overlying support apparatus. The soft tissues are grossly unremarkable. IMPRESSION: 1. Displaced and angulated fracture of the proximal left femoral diaphysis. 2. Total left knee arthroplasty. Electronically Signed   By: Anner Crete M.D.   On: 05/27/2022 02:36   DG Tibia/Fibula Left  Result Date: 05/27/2022 CLINICAL DATA:  Trauma to the left lower extremity. EXAM: PORTABLE PELVIS 1-2 VIEWS; LEFT FEMUR 2 VIEWS; LEFT TIBIA AND FIBULA - 2 VIEW COMPARISON:  CT of the abdomen pelvis dated 06/17/2013. FINDINGS: There is a displaced oblique fracture of the proximal left femoral diaphysis extending from the subtrochanteric region into the proximal femoral shaft. There is lateral displacement of the distal fracture fragment with mild Verus angulation. There is proximal migration of the femoral diaphysis resulting in a degree of overlap of the fracture fragments. No dislocation. The bones are mildly osteopenic. There is a total left knee arthroplasty. The arthroplasty components appear intact and in anatomic alignment. Evaluation of the tibia and fibula is limited due to overlying support apparatus. The soft tissues are grossly unremarkable. IMPRESSION: 1. Displaced and angulated fracture of the proximal left femoral diaphysis. 2. Total left knee arthroplasty. Electronically Signed   By: Anner Crete M.D.   On: 05/27/2022 02:36   DG Chest Port 1 View  Result Date: 05/27/2022 CLINICAL DATA:  Trauma. EXAM: PORTABLE CHEST 1 VIEW COMPARISON:  Chest radiograph dated 05/20/2012. FINDINGS: No focal consolidation, pleural effusion, pneumothorax. The cardiac silhouette is within normal limits. No acute osseous pathology. IMPRESSION: No active disease. Electronically Signed   By: Anner Crete M.D.   On: 05/27/2022 02:33     Labs:   Basic Metabolic Panel: Recent Labs  Lab 05/27/22 0136 05/27/22 0212 05/27/22 0658 05/27/22 1523 05/28/22 0324  NA 135 138 139  --  130*  K 3.8 3.9 4.2  --  4.0  CL 105 111 107  --  101  CO2 17*  --  21*  --  25  GLUCOSE 94 88 88  --  177*  BUN '21 23 22  '$ --  18  CREATININE 0.63 0.70 0.58 0.53 0.56  CALCIUM 8.4*  --  8.5*  --  8.0*   GFR Estimated Creatinine Clearance: 73.5 mL/min (by C-G formula based on SCr of 0.56 mg/dL). Liver Function Tests: Recent Labs  Lab 05/27/22 0136  AST 29  ALT 24  ALKPHOS 69  BILITOT 0.4  PROT 6.3*  ALBUMIN 3.7   No results for input(s): "LIPASE", "AMYLASE" in the last 168 hours. No results for input(s): "AMMONIA" in the last 168 hours. Coagulation profile Recent Labs  Lab 05/27/22 0136  INR 1.0    CBC: Recent Labs  Lab 05/27/22 0136 05/27/22 0212 05/27/22 1523 05/28/22 0324  WBC 9.7  --  8.0 6.5  HGB 12.9 13.3 11.0* 8.7*  HCT 40.2 39.0 34.5* 26.6*  MCV 95.7  --  94.5 93.0  PLT 217  --  207 193   Cardiac Enzymes: No results for input(s): "CKTOTAL", "CKMB", "CKMBINDEX", "TROPONINI" in the last 168 hours. BNP: Invalid input(s): "POCBNP" CBG: No results for input(s): "GLUCAP" in the last 168 hours. D-Dimer No results for input(s): "DDIMER" in the last 72 hours. Hgb A1c No results for input(s): "HGBA1C" in the last 72 hours. Lipid Profile No results for input(s): "CHOL", "HDL", "LDLCALC", "TRIG", "CHOLHDL", "LDLDIRECT" in the last 72 hours. Thyroid function studies No results for input(s): "TSH", "T4TOTAL", "T3FREE", "THYROIDAB" in the last 72 hours.  Invalid input(s): "FREET3" Anemia work up No results for input(s): "VITAMINB12", "FOLATE", "FERRITIN", "TIBC", "IRON", "RETICCTPCT" in the last 72 hours. Microbiology No results found for this or any previous visit (from the past 240 hour(s)).  Time coordinating discharge: 35 minutes  Signed: Kiahna Banghart  [facility] 05/28/2022, 1:55 PM

## 2022-05-28 NOTE — Plan of Care (Signed)
  Problem: Education: Goal: Knowledge of General Education information will improve Description: Including pain rating scale, medication(s)/side effects and non-pharmacologic comfort measures Outcome: Progressing   Problem: Activity: Goal: Risk for activity intolerance will decrease Outcome: Progressing   Problem: Coping: Goal: Level of anxiety will decrease Outcome: Progressing   

## 2022-05-28 NOTE — TOC Progression Note (Signed)
Transition of Care [facility] - Progression Note    Patient Details  Name: Julia Murray MRN: [id number] Date of Birth: 1955-11-14  [facility] of [facility]) CM/SW Contact  Bartholomew Crews, RN Phone Number: [phone] 05/28/2022, 1:23 PM  Clinical Narrative:     Chart reviewed. PT/OT evaluations. Recommendations - no f/u needed at this time. No DME recommendations.        Expected Discharge Plan and Services                                               Social Determinants of Health (SDOH) Interventions SDOH Screenings   Depression (PHQ2-9): Low Risk  (02/28/2021)  Tobacco Use: Low Risk  (05/27/2022)    Readmission Risk Interventions     No data to display

## 2022-05-28 NOTE — Progress Notes (Signed)
Nutrition Brief Note  Consult received via hip fracture protocol.  Patient is s/p IMN of L femur.   Spoke with patient at bedside while PT present getting ready to assist patient to bedside chair. She reports that since December, she has been working with  Warden/ranger" to work on her diet. She has been cutting back on carbohydrates and trying to eat a more balanced diet. Patient reports that she has not had any significant changes to her intake or appetite. She endorses an intentional weight loss of 10 lbs since beginning her diet with her health coach.   Encouraged pt to eat adequate protein with all snacks and meals to aid in post-op healing.   Body mass index is 29.07 kg/m. Patient meets criteria for overweight based on current BMI.   Current diet order is Regular. No currently documented meal completions on file.  Labs and medications reviewed.   No nutrition interventions warranted at this time. If nutrition issues arise, please consult RD.   Clayborne Dana, RDN, LDN Clinical Nutrition

## 2022-05-28 NOTE — Evaluation (Signed)
Physical Therapy Evaluation  Patient Details Name: Julia Murray MRN: [id number] DOB: October 21, 1955 Today's Date: 05/28/2022  History of Present Illness  Pt is a 67 y/o female who presents s/p fall at home, sustaining a L femur fracture. She is now s/p cephalomedullary nailing and is WBAT on the LLE. PMH significant for L ACL repair, L TKA 2022, R knee scope 2013.   Clinical Impression  Pt admitted with above diagnosis. Pt currently with functional limitations due to the deficits listed below (see PT Problem List). At the time of PT eval pt was able to perform transfers and ambulation with min guard to min assist and RW for support. Pt reports she will have a good support system at home. Recommend outpatient follow up if pt not progressing as quickly as she would like, however anticipate pt will progress well. Pt will benefit from skilled PT to increase their independence and safety with mobility to allow discharge to the venue listed below.          Recommendations for follow up therapy are one component of a multi-disciplinary discharge planning process, led by the attending physician.  Recommendations may be updated based on patient status, additional functional criteria and insurance authorization.  Follow Up Recommendations Outpatient PT (When appropriate per post-op protocol)      Assistance Recommended at Discharge Intermittent Supervision/Assistance  Patient can return home with the following  A little help with walking and/or transfers;A little help with bathing/dressing/bathroom;Assistance with cooking/housework;Assist for transportation;Help with stairs or ramp for entrance    Equipment Recommendations None recommended by PT  Recommendations for Other Services       Functional Status Assessment Patient has had a recent decline in their functional status and demonstrates the ability to make significant improvements in function in a reasonable and predictable amount of time.      Precautions / Restrictions Precautions Precautions: Fall Restrictions Weight Bearing Restrictions: Yes LLE Weight Bearing: Weight bearing as tolerated      Mobility  Bed Mobility Overal bed mobility: Needs Assistance Bed Mobility: Supine to Sit     Supine to sit: Min assist     General bed mobility comments: Light assist to advance LLE towards EOB. Increased time to transition fully to EOB and get feet on the floor.    Transfers Overall transfer level: Needs assistance Equipment used: Rolling walker (2 wheels) Transfers: Sit to/from Stand Sit to Stand: Min assist           General transfer comment: VC's for hand placement on seated surface for safety. Heavy min assist to power up to full stand.    Ambulation/Gait Ambulation/Gait assistance: Min guard Gait Distance (Feet): 200 Feet Assistive device: Rolling walker (2 wheels) Gait Pattern/deviations: Step-through pattern, Decreased stride length, Trunk flexed Gait velocity: Decreased Gait velocity interpretation: 1.31 - 2.62 ft/sec, indicative of limited community ambulator   General Gait Details: pt demonstrating good step-through pattern and heel strike on the LLE. Pt moving slow but generally steady without overt LOB noted.  Stairs Stairs: Yes Stairs assistance: Min guard Stair Management: Two rails, Step to pattern, Forwards Number of Stairs: 3 General stair comments: Pt on practice stairs in rehab gym. Pt demonstrated the ability to manage 3 stairs without physical assist, however hands on guarding provided for safety. VC's throughout for sequencing.  Wheelchair Mobility    Modified Rankin (Stroke Patients Only)       Balance Overall balance assessment: Needs assistance Sitting-balance support: No upper extremity supported, Feet supported Sitting  balance-Leahy Scale: Fair     Standing balance support: Bilateral upper extremity supported, During functional activity, Reliant on assistive device for  balance Standing balance-Leahy Scale: Poor                               Pertinent Vitals/Pain Pain Assessment Pain Assessment: Faces Faces Pain Scale: Hurts even more Pain Location: L hip Pain Descriptors / Indicators: Operative site guarding, Sore Pain Intervention(s): Limited activity within patient's tolerance, Monitored during session, Repositioned, Ice applied    Home Living Family/patient expects to be discharged to:: Private residence Living Arrangements: Spouse/significant other Available Help at Discharge: Family;Available PRN/intermittently Type of Home: House Home Access: Stairs to enter Entrance Stairs-Rails: Right;Left;Can reach both Entrance Stairs-Number of Steps: 1 Alternate Level Stairs-Number of Steps: flight Home Layout: Two level;Able to live on main level with bedroom/bathroom Home Equipment: Shower seat - built Agricultural consultant (2 wheels);Toilet riser      Prior Function Prior Level of Function : Independent/Modified Independent;Driving                     Hand Dominance        Extremity/Trunk Assessment   Upper Extremity Assessment Upper Extremity Assessment: Defer to OT evaluation    Lower Extremity Assessment Lower Extremity Assessment: LLE deficits/detail LLE Deficits / Details: Acute pain, decreased strength and AROM consistent with above mentioned surgery.    Cervical / Trunk Assessment Cervical / Trunk Assessment: Normal  Communication   Communication: No difficulties  Cognition Arousal/Alertness: Awake/alert Behavior During Therapy: WFL for tasks assessed/performed Overall Cognitive Status: Within Functional Limits for tasks assessed                                          General Comments      Exercises General Exercises - Lower Extremity Ankle Circles/Pumps: 10 reps Quad Sets: 10 reps Short Arc Quad: 10 reps Long Arc Quad: 10 reps Heel Slides: 10 reps Hip  ABduction/ADduction: 10 reps   Assessment/Plan    PT Assessment Patient needs continued PT services  PT Problem List Decreased strength;Decreased range of motion;Decreased balance;Decreased activity tolerance;Decreased mobility;Decreased knowledge of use of DME;Decreased safety awareness;Decreased knowledge of precautions;Pain       PT Treatment Interventions DME instruction;Gait training;Stair training;Functional mobility training;Therapeutic activities;Therapeutic exercise;Balance training;Patient/family education    PT Goals (Current goals can be found in the Care Plan section)  Acute Rehab PT Goals Patient Stated Goal: Home today. Hoping recovery is easier than her TKA PT Goal Formulation: With patient Time For Goal Achievement: 06/04/22 Potential to Achieve Goals: Good    Frequency Min 5X/week     Co-evaluation               AM-PAC PT "6 Clicks" Mobility  Outcome Measure Help needed turning from your back to your side while in a flat bed without using bedrails?: A Little Help needed moving from lying on your back to sitting on the side of a flat bed without using bedrails?: A Little Help needed moving to and from a bed to a chair (including a wheelchair)?: A Little Help needed standing up from a chair using your arms (e.g., wheelchair or bedside chair)?: A Little Help needed to walk in hospital room?: A Little Help needed climbing 3-5 steps with a railing? : A Little 6 Click  Score: 18    End of Session Equipment Utilized During Treatment: Gait belt Activity Tolerance: Patient tolerated treatment well Patient left: in chair;with call bell/phone within reach;with nursing/sitter in room Nurse Communication: Mobility status PT Visit Diagnosis: Unsteadiness on feet (R26.81);Pain Pain - Right/Left: Left Pain - part of body: Hip    Time: 1014-1050 PT Time Calculation (min) (ACUTE ONLY): 36 min   Charges:   PT Evaluation $PT Eval Moderate Complexity: 1 Mod PT  Treatments $Gait Training: 8-22 mins        Julia Murray, PT, DPT Acute Rehabilitation Services Secure Chat Preferred Office: [phone]   Thelma Comp 05/28/2022, 11:50 AM

## 2022-05-28 NOTE — Evaluation (Signed)
Occupational Therapy Evaluation Patient Details Name: Julia Murray MRN: [id number] DOB: 1955-09-19 Today's Date: 05/28/2022   History of Present Illness Pt is a 67 y/o female who presents s/p fall at home, sustaining a L femur fracture. She is now s/p cephalomedullary nailing and is WBAT on the LLE. PMH significant for L ACL repair, L TKA 2022, R knee scope 2013.   Clinical Impression   Patient admitted for the diagnosis and procedure above.  PTA she lives at home with her spouse, and needed no assist with ADL, iADL, or mobility.  Patient is doing quite well despite expected post op soreness.  Patient needing only generalized supervision for ADL completion and in room mobility.  No significant acute OT needs are identified,and no post acute OT is anticipated.        Recommendations for follow up therapy are one component of a multi-disciplinary discharge planning process, led by the attending physician.  Recommendations may be updated based on patient status, additional functional criteria and insurance authorization.   Follow Up Recommendations  No OT follow up     Assistance Recommended at Discharge Intermittent Supervision/Assistance  Patient can return home with the following Assist for transportation;Assistance with cooking/housework    Functional Status Assessment  Patient has had a recent decline in their functional status and demonstrates the ability to make significant improvements in function in a reasonable and predictable amount of time.  Equipment Recommendations  None recommended by OT    Recommendations for Other Services       Precautions / Restrictions Precautions Precautions: Fall Restrictions Weight Bearing Restrictions: Yes LLE Weight Bearing: Weight bearing as tolerated      Mobility Bed Mobility               General bed mobility comments: up in the recliner    Transfers Overall transfer level: Needs assistance Equipment used: Rolling walker  (2 wheels) Transfers: Sit to/from Stand Sit to Stand: Min guard                  Balance Overall balance assessment: Needs assistance Sitting-balance support: No upper extremity supported, Feet supported Sitting balance-Leahy Scale: Fair     Standing balance support: Bilateral upper extremity supported, During functional activity, Reliant on assistive device for balance Standing balance-Leahy Scale: Poor                             ADL either performed or assessed with clinical judgement   ADL       Grooming: Supervision/safety;Standing               Lower Body Dressing: Supervision/safety;Sit to/from stand   Toilet Transfer: Supervision/safety;Rolling walker (2 wheels);Ambulation                   Vision Patient Visual Report: No change from baseline       Perception     Praxis      Pertinent Vitals/Pain Pain Assessment Faces Pain Scale: Hurts little more Pain Location: L hip Pain Descriptors / Indicators: Operative site guarding, Sore Pain Intervention(s): Monitored during session     Hand Dominance Right   Extremity/Trunk Assessment Upper Extremity Assessment Upper Extremity Assessment: Overall WFL for tasks assessed   Lower Extremity Assessment Lower Extremity Assessment: Defer to PT evaluation LLE Deficits / Details: Acute pain, decreased strength and AROM consistent with above mentioned surgery.   Cervical / Trunk Assessment Cervical / Trunk Assessment:  Normal   Communication Communication Communication: No difficulties   Cognition Arousal/Alertness: Awake/alert Behavior During Therapy: WFL for tasks assessed/performed Overall Cognitive Status: Within Functional Limits for tasks assessed                                                        Home Living Family/patient expects to be discharged to:: Private residence Living Arrangements: Spouse/significant other Available Help at Discharge:  Family;Available PRN/intermittently Type of Home: House Home Access: Stairs to enter [employer] of Steps: 1 Entrance Stairs-Rails: Right;Left;Can reach both Home Layout: Two level;Able to live on main level with bedroom/bathroom Alternate Level Stairs-Number of Steps: flight   Bathroom Shower/Tub: Walk-in shower;Curtain   Bathroom Toilet: Standard Bathroom Accessibility: Yes How Accessible: Accessible via walker Home Equipment: Shower seat - built in;BSC/3in1;Rolling Environmental consultant (2 wheels);Toilet riser          Prior Functioning/Environment Prior Level of Function : Independent/Modified Independent;Driving                        OT Problem List: Pain;Decreased strength      OT Treatment/Interventions:      OT Goals(Current goals can be found in the care plan section) Acute Rehab OT Goals Patient Stated Goal: Return home OT Goal Formulation: With patient Time For Goal Achievement: 06/01/22 Potential to Achieve Goals: Good  OT Frequency:      Co-evaluation              AM-PAC OT "6 Clicks" Daily Activity     Outcome Measure Help from another person eating meals?: None Help from another person taking care of personal grooming?: None Help from another person toileting, which includes using toliet, bedpan, or urinal?: A Little Help from another person bathing (including washing, rinsing, drying)?: A Little Help from another person to put on and taking off regular upper body clothing?: None Help from another person to put on and taking off regular lower body clothing?: A Little 6 Click Score: 21   End of Session Equipment Utilized During Treatment: Rolling walker (2 wheels) Nurse Communication: Mobility status  Activity Tolerance: Patient tolerated treatment well Patient left: in chair;with call bell/phone within reach  OT Visit Diagnosis: Pain Pain - Right/Left: Left Pain - part of body: Leg                Time: 1134-1150 OT Time Calculation  (min): 16 min Charges:  OT General Charges $OT Visit: 1 Visit OT Evaluation $OT Eval Moderate Complexity: 1 Mod  05/28/2022  RP, OTR/L  Acute Rehabilitation Services  Office:  [phone]   Metta Clines 05/28/2022, 11:56 AM

## 2022-05-29 ENCOUNTER — Encounter (HOSPITAL_COMMUNITY): Payer: Self-pay | Admitting: Student

## 2022-05-29 LAB — VITAMIN D 25 HYDROXY (VIT D DEFICIENCY, FRACTURES)

## 2022-05-29 LAB — MISC LABCORP TEST (SEND OUT): Labcorp test code: 81950

## 2022-06-06 ENCOUNTER — Other Ambulatory Visit: Payer: Self-pay | Admitting: Internal Medicine

## 2022-06-06 DIAGNOSIS — M25552 Pain in left hip: Secondary | ICD-10-CM | POA: Diagnosis not present

## 2022-06-06 DIAGNOSIS — S7221XS Displaced subtrochanteric fracture of right femur, sequela: Secondary | ICD-10-CM | POA: Diagnosis not present

## 2022-06-11 ENCOUNTER — Other Ambulatory Visit: Payer: Self-pay

## 2022-06-11 ENCOUNTER — Inpatient Hospital Stay (HOSPITAL_COMMUNITY)
Admission: EM | Admit: 2022-06-11 | Discharge: 2022-06-13 | DRG: 482 | Disposition: A | Discharge status: Home Health | Payer: Medicare Other | Attending: Student | Admitting: Student

## 2022-06-11 ENCOUNTER — Emergency Department (HOSPITAL_COMMUNITY): Payer: Medicare Other

## 2022-06-11 ENCOUNTER — Inpatient Hospital Stay (HOSPITAL_COMMUNITY): Payer: Medicare Other

## 2022-06-11 ENCOUNTER — Encounter (HOSPITAL_COMMUNITY): Payer: Self-pay | Admitting: Student

## 2022-06-11 DIAGNOSIS — S7222XA Displaced subtrochanteric fracture of left femur, initial encounter for closed fracture: Secondary | ICD-10-CM | POA: Diagnosis not present

## 2022-06-11 DIAGNOSIS — K219 Gastro-esophageal reflux disease without esophagitis: Secondary | ICD-10-CM | POA: Diagnosis present

## 2022-06-11 DIAGNOSIS — T84115A Breakdown (mechanical) of internal fixation device of left femur, initial encounter: Secondary | ICD-10-CM | POA: Diagnosis not present

## 2022-06-11 DIAGNOSIS — S7292XA Unspecified fracture of left femur, initial encounter for closed fracture: Secondary | ICD-10-CM | POA: Diagnosis present

## 2022-06-11 DIAGNOSIS — S72142A Displaced intertrochanteric fracture of left femur, initial encounter for closed fracture: Secondary | ICD-10-CM | POA: Diagnosis not present

## 2022-06-11 DIAGNOSIS — Y792 Prosthetic and other implants, materials and accessory orthopedic devices associated with adverse incidents: Secondary | ICD-10-CM | POA: Diagnosis present

## 2022-06-11 DIAGNOSIS — M79605 Pain in left leg: Principal | ICD-10-CM

## 2022-06-11 DIAGNOSIS — R600 Localized edema: Secondary | ICD-10-CM | POA: Diagnosis not present

## 2022-06-11 DIAGNOSIS — S7222XD Displaced subtrochanteric fracture of left femur, subsequent encounter for closed fracture with routine healing: Secondary | ICD-10-CM | POA: Diagnosis not present

## 2022-06-11 DIAGNOSIS — E785 Hyperlipidemia, unspecified: Secondary | ICD-10-CM | POA: Diagnosis not present

## 2022-06-11 DIAGNOSIS — D259 Leiomyoma of uterus, unspecified: Secondary | ICD-10-CM | POA: Diagnosis not present

## 2022-06-11 DIAGNOSIS — Z833 Family history of diabetes mellitus: Secondary | ICD-10-CM | POA: Diagnosis not present

## 2022-06-11 DIAGNOSIS — Z8616 Personal history of COVID-19: Secondary | ICD-10-CM

## 2022-06-11 DIAGNOSIS — T84418A Breakdown (mechanical) of other internal orthopedic devices, implants and grafts, initial encounter: Secondary | ICD-10-CM | POA: Diagnosis not present

## 2022-06-11 DIAGNOSIS — Z79899 Other long term (current) drug therapy: Secondary | ICD-10-CM | POA: Diagnosis not present

## 2022-06-11 DIAGNOSIS — Z4789 Encounter for other orthopedic aftercare: Secondary | ICD-10-CM | POA: Diagnosis not present

## 2022-06-11 DIAGNOSIS — S72112A Displaced fracture of greater trochanter of left femur, initial encounter for closed fracture: Secondary | ICD-10-CM | POA: Diagnosis not present

## 2022-06-11 DIAGNOSIS — Z8249 Family history of ischemic heart disease and other diseases of the circulatory system: Secondary | ICD-10-CM

## 2022-06-11 DIAGNOSIS — S79929A Unspecified injury of unspecified thigh, initial encounter: Secondary | ICD-10-CM | POA: Diagnosis not present

## 2022-06-11 DIAGNOSIS — W19XXXA Unspecified fall, initial encounter: Secondary | ICD-10-CM | POA: Diagnosis not present

## 2022-06-11 DIAGNOSIS — R0902 Hypoxemia: Secondary | ICD-10-CM | POA: Diagnosis not present

## 2022-06-11 DIAGNOSIS — Z96652 Presence of left artificial knee joint: Secondary | ICD-10-CM | POA: Diagnosis present

## 2022-06-11 DIAGNOSIS — R609 Edema, unspecified: Secondary | ICD-10-CM | POA: Diagnosis not present

## 2022-06-11 DIAGNOSIS — F419 Anxiety disorder, unspecified: Secondary | ICD-10-CM | POA: Diagnosis not present

## 2022-06-11 DIAGNOSIS — S72122A Displaced fracture of lesser trochanter of left femur, initial encounter for closed fracture: Secondary | ICD-10-CM | POA: Diagnosis not present

## 2022-06-11 DIAGNOSIS — T84125A Displacement of internal fixation device of left femur, initial encounter: Secondary | ICD-10-CM | POA: Diagnosis not present

## 2022-06-11 DIAGNOSIS — M1990 Unspecified osteoarthritis, unspecified site: Secondary | ICD-10-CM | POA: Diagnosis not present

## 2022-06-11 LAB — CBC
HCT: 31.9 % — ABNORMAL LOW (ref 36.0–46.0)
Hemoglobin: 10.1 g/dL — ABNORMAL LOW (ref 12.0–15.0)
MCH: 31.1 pg (ref 26.0–34.0)
MCHC: 31.7 g/dL (ref 30.0–36.0)
MCV: 98.2 fL (ref 80.0–100.0)
Platelets: 470 10*3/uL — ABNORMAL HIGH (ref 150–400)
RBC: 3.25 MIL/uL — ABNORMAL LOW (ref 3.87–5.11)
RDW: 16.1 % — ABNORMAL HIGH (ref 11.5–15.5)
WBC: 5.7 10*3/uL (ref 4.0–10.5)
nRBC: 0 % (ref 0.0–0.2)

## 2022-06-11 LAB — BASIC METABOLIC PANEL
Anion gap: 11 (ref 5–15)
BUN: 16 mg/dL (ref 8–23)
CO2: 23 mmol/L (ref 22–32)
Calcium: 9.1 mg/dL (ref 8.9–10.3)
Chloride: 105 mmol/L (ref 98–111)
Creatinine, Ser: 0.58 mg/dL (ref 0.44–1.00)
GFR, Estimated: >60 mL/min (ref >60)
Glucose, Bld: 91 mg/dL (ref 70–99)
Potassium: 4 mmol/L (ref 3.5–5.1)
Sodium: 139 mmol/L (ref 135–145)

## 2022-06-11 MED ORDER — METHOCARBAMOL 1000 MG/10ML IJ SOLN: 500.0000 mg | Freq: Four times a day (QID) | INTRAVENOUS | Status: DC | PRN

## 2022-06-11 MED ORDER — ONDANSETRON HCL 4 MG/2ML IJ SOLN: 4.0000 mg | Freq: Four times a day (QID) | INTRAMUSCULAR | Status: DC | PRN

## 2022-06-11 MED ORDER — MORPHINE SULFATE (PF) 2 MG/ML IV SOLN: 2.0000 mg | INTRAVENOUS | Status: DC | PRN

## 2022-06-11 MED ORDER — ONDANSETRON HCL 4 MG/2ML IJ SOLN
4.0000 mg | Freq: Once | INTRAMUSCULAR | Status: AC
  Administered 2022-06-11: 4 mg via INTRAVENOUS
  Filled 2022-06-11: qty 2

## 2022-06-11 MED ORDER — METHOCARBAMOL 500 MG PO TABS
500.0000 mg | ORAL_TABLET | Freq: Four times a day (QID) | ORAL | Status: DC | PRN
  Administered 2022-06-11 – 2022-06-13 (×7): 500 mg via ORAL
  Filled 2022-06-11 (×7): qty 1

## 2022-06-11 MED ORDER — ONDANSETRON HCL 4 MG PO TABS: 4.0000 mg | ORAL_TABLET | Freq: Four times a day (QID) | ORAL | Status: DC | PRN

## 2022-06-11 MED ORDER — OXYCODONE HCL 5 MG PO TABS
5.0000 mg | ORAL_TABLET | ORAL | Status: DC | PRN
  Administered 2022-06-11 (×2): 10 mg via ORAL
  Administered 2022-06-11: 5 mg via ORAL
  Administered 2022-06-12 (×3): 10 mg via ORAL
  Administered 2022-06-13: 15 mg via ORAL
  Administered 2022-06-13: 5 mg via ORAL
  Filled 2022-06-11: qty 2
  Filled 2022-06-11: qty 3
  Filled 2022-06-11: qty 2
  Filled 2022-06-11: qty 1
  Filled 2022-06-11: qty 2
  Filled 2022-06-11: qty 1
  Filled 2022-06-11: qty 2
  Filled 2022-06-11: qty 3

## 2022-06-11 MED ORDER — ENOXAPARIN SODIUM 40 MG/0.4ML IJ SOSY: 40.0000 mg | PREFILLED_SYRINGE | INTRAMUSCULAR | Status: DC

## 2022-06-11 MED ORDER — MORPHINE SULFATE (PF) 4 MG/ML IV SOLN
4.0000 mg | Freq: Once | INTRAVENOUS | Status: AC
  Administered 2022-06-11: 4 mg via INTRAVENOUS
  Filled 2022-06-11: qty 1

## 2022-06-11 NOTE — ED Provider Notes (Signed)
[facility] Provider Note   CSN: [id number] Arrival date & time: 06/11/22  1022     History  Chief Complaint  Patient presents with   Leg Pain    Julia Murray is a 67 y.o. female.  67 year old female with a PMH of LEFT knee replacement presents to the ED with left leg pain which began yesterday.  Patient reports she was ambulating from the kitchen onto the room when she felt a popping sensation to the left knee. She reports since the incident she has not been able to weight-bear as she feels like her knee is not stable and she is unable to weight-bear.  She has taken some hydrocodone, pain control along at home without any improvement in symptoms.  He did have a femur fracture 2 weeks ago and was repaired by Dr. Headaches, she did call him today and they referred her to the emergency department. Has not been able to ambulate since the incident. No other injury, not on any blood thinners.   The history is provided by the patient and medical records.  Leg Pain Location:  Leg Time since incident:  1 day Injury: no   Leg location:  L leg Associated symptoms: no back pain and no fever        Home Medications Prior to Admission medications   Medication Sig Start Date End Date Taking? Authorizing Provider  acetaminophen (TYLENOL) 325 MG tablet Take 1-2 tablets (325-650 mg total) by mouth every 6 (six) hours as needed for mild pain (pain score 1-3 or temp > 100.5). 05/28/22   Corinne Ports, PA-C  apixaban (ELIQUIS) 2.5 MG TABS tablet Take 1 tablet (2.5 mg total) by mouth 2 (two) times daily. 05/28/22 06/27/22  Corinne Ports, PA-C  diclofenac Sodium (VOLTAREN) 1 % GEL Apply 1 application topically daily as needed (pain).    [provider]  fluticasone (FLONASE) 50 MCG/ACT nasal spray SPRAY TWO SPRAYS IN EACH NOSTRIL ONCE DAILY Patient taking differently: Place 2 sprays into both nostrils daily. 12/12/20   Elby Showers, MD   HYDROcodone-acetaminophen (NORCO/VICODIN) 5-325 MG tablet Take 1-2 tablets by mouth every 4 (four) hours as needed for severe pain or moderate pain (1 tablet moderate pain, 2 tablets severe pain). 05/28/22   Corinne Ports, PA-C  lansoprazole (PREVACID) 15 MG capsule TAKE 1 CAPSULE DAILY AT 12 NOON Patient taking differently: Take 15 mg by mouth daily as needed (heartburn). 12/17/17   Elby Showers, MD  methocarbamol (ROBAXIN) 500 MG tablet Take 1 tablet (500 mg total) by mouth every 6 (six) hours as needed for muscle spasms. 05/28/22   Corinne Ports, PA-C  Multiple Vitamins-Minerals (MULTIVITAMIN WITH MINERALS) tablet Take 1 tablet by mouth daily.    [provider]  valACYclovir (VALTREX) 500 MG tablet TAKE ONE TABLET BY MOUTH ONCE WEEKLY WHICH PREVENTS OUTBREAKS OF HSV Patient taking differently: Take 500 mg by mouth daily as needed (fever blisters). 03/13/21   Elby Showers, MD      Allergies    Shellfish allergy, Bee venom, and Crab extract allergy skin test    Review of Systems   Review of Systems  Constitutional:  Negative for chills and fever.  Respiratory:  Negative for shortness of breath.   Cardiovascular:  Negative for chest pain.  Gastrointestinal:  Negative for abdominal pain, nausea and vomiting.  Genitourinary:  Negative for flank pain.  Musculoskeletal:  Positive for arthralgias and myalgias. Negative for  back pain and joint swelling.  All other systems reviewed and are negative.   Physical Exam Updated Vital Signs BP (!) 151/86 (BP Location: Left Arm)   Pulse 86   Temp 98.2 F (36.8 C) (Oral)   Resp 16   SpO2 100%  Physical Exam Vitals and nursing note reviewed.  Constitutional:      Appearance: Normal appearance.  HENT:     Head: Normocephalic and atraumatic.     Mouth/Throat:     Mouth: Mucous membranes are moist.  Eyes:     Pupils: Pupils are equal, round, and reactive to light.  Cardiovascular:     Rate and Rhythm: Normal rate.   Pulmonary:     Effort: Pulmonary effort is normal.     Breath sounds: No wheezing.  Musculoskeletal:        General: Tenderness and signs of injury present.     Cervical back: Normal range of motion and neck supple.     Left knee: Bony tenderness present. No deformity, effusion or erythema. Decreased range of motion. Tenderness present. Normal alignment.     Comments: Decrease ROM due to pain, palpable tenderness to the distal aspect.   Skin:    General: Skin is warm and dry.  Neurological:     Mental Status: She is alert and oriented to person, place, and time.     ED Results / Procedures / Treatments   Labs (all labs ordered are listed, but only abnormal results are displayed) Labs Reviewed - No data to display  EKG None  Radiology DG Knee 2 Views Left  Result Date: 06/11/2022 CLINICAL DATA:  Left leg pain EXAM: LEFT KNEE - 1-2 VIEW COMPARISON:  Prior radiographs 05/27/2022 FINDINGS: The distal interlocking screw has significantly backed out compared to the prior radiographs. The screw is now backed out by 1.7 cm in a lateral direction. Diffuse reticulation of the subcutaneous fat consistent with edema versus cellulitis. Prior surgical changes of total knee arthroplasty. IMPRESSION: Significant interval displacement of the distal interlocking screw which is now backed out by approximately 1.7 cm compared to prior radiographs from January of 2024. Surgical changes of prior total knee arthroplasty without evidence of complication. Subcutaneous edema versus cellulitis. Electronically Signed   By: Jacqulynn Cadet M.D.   On: 06/11/2022 12:09   DG Femur Min 2 Views Left  Result Date: 06/11/2022 CLINICAL DATA:  Follow-up left proximal femur fracture ORIF. EXAM: LEFT FEMUR 2 VIEWS COMPARISON:  05/27/2022. FINDINGS: Since the most recent prior study, there has a change in the orthopedic hardware. The distal fixation screw has migrated laterally, now extending 1.4 cm lateral to the lateral  cortical margin. The tip of the screw lies within the intramedullary rod. The proximal fracture components now show mild displacement, more inferior and medial fracture component displaced medially by 7 mm. Greater trochanter component displaced laterally by 7 mm. The femoral neck/head screws appear well seated. No new fractures.  Knee and hip joints remain normally aligned. Lateral soft tissue edema has significantly improved. IMPRESSION: 1. Interval movement of the orthopedic fixation hardware as detailed above with mild displacement of the proximal fracture components compared to the immediate postoperative images. No new fractures. Electronically Signed   By: Lajean Manes M.D.   On: 06/11/2022 11:20    Procedures Procedures    Medications Ordered in ED Medications  morphine (PF) 4 MG/ML injection 4 mg (4 mg Intravenous Given 06/11/22 1128)  ondansetron (ZOFRAN) injection 4 mg (4 mg Intravenous Given 06/11/22 1128)  ED Course/ Medical Decision Making/ A&P                             Medical Decision Making Amount and/or Complexity of Data Reviewed Radiology: ordered.  Risk Prescription drug management.   Patient here with sudden onset of left flank pain which began yesterday after she was walking from her bathroom onto another room, felt a popping sensation to her left knee.  Reports that she has not been able to weight-bear.  She reports excruciating pain especially described as "like I am not steady ".  She denies being able to put any weight along her left leg, did take hydrocodone which did not help with any of her symptoms.  Last oral intake was yesterday.  Patient is not anticoagulated.  She did have a left femur fracture fixation on January 21 by Dr. Doreatha Martin, she did call him this morning in order to obtain further recommendations.  Exam patient is teary-eyed, she reports significant discomfort.  No pain along the left femoral neck or head with palpation.  Unable to lift the left  leg above her bed.  Knee without any erythema but some bruising along the femur to the internal along with lateral aspect.  She is not anticoagulated.  Some suspicion for acute deformity.  X-ray interpreted by me with the distal screw 1. Interval movement of the orthopedic fixation hardware as detailed  above with mild displacement of the proximal fracture components  compared to the immediate p   She was given morphine and Zofran to help with symptomatic treatment.  I discussed this case with APP Hilbert Odor of orthopedics who agreed to evaluate patient.  Patient was informed of x-ray results, she will need a CT femur to further evaluate the fixation movement.  Orthopedics does recommend admitting patient along with having surgical repair in the near future.  Patient is n.p.o. as of 12:43 PM.  She is hemodynamically stable for orthopedic admission.   Portions of this note were generated with [employer]. Dictation errors may occur despite best attempts at proofreading.   Final Clinical Impression(s) / ED Diagnoses Final diagnoses:  Left leg pain    Rx / DC Orders ED Discharge Orders     None         Janeece Fitting, PA-C 06/11/22 1244    Malvin Johns, MD 06/11/22 1559

## 2022-06-11 NOTE — ED Triage Notes (Signed)
Patient from home for eval of severe L leg pain since last night when she heard pop while walking with her walker. Had surgery on 1/21 by Dr. Doreatha Martin after sustaining a femur fracture to same leg. Pt unable to bear weight since and having pain to same area.

## 2022-06-11 NOTE — ED Notes (Signed)
ED TO INPATIENT HANDOFF REPORT  ED Nurse Name and Phone #: Judson Roch [phone]  S Name/Age/Gender Julia Murray 67 y.o. female Room/Bed: H015C/H015C  Code Status   Code Status: Full Code  Home/SNF/Other Home Patient oriented to: self, place, time, and situation Is this baseline? Yes   Triage Complete: Triage complete  Chief Complaint Closed fracture of left femur ([place]) [S72.92XA]  Triage Note Patient from home for eval of severe L leg pain since last night when she heard pop while walking with her walker. Had surgery on 1/21 by Dr. Doreatha Martin after sustaining a femur fracture to same leg. Pt unable to bear weight since and having pain to same area.    Allergies Allergies  Allergen Reactions   Shellfish Allergy Hives and Swelling    Pt States betadine ok   Bee Venom Swelling   Crab Extract Allergy Skin Test Hives and Swelling    Level of Care/Admitting Diagnosis ED Disposition     ED Disposition  Admit   Condition  --   [place]: [facility] [[id number]]  Level of Care: Med-Surg [16]  May admit patient to [facility] or [facility] if equivalent level of care is available:: No  Covid Evaluation: Asymptomatic - no recent exposure (last 10 days) testing not required  Diagnosis: Closed fracture of left femur [facility]) [[id number]]  Admitting Physician: Shona Needles [[id number]]  Attending Physician: Shona Needles [[id number]]  Bed request comments: 5N  Certification:: I certify this patient will need inpatient services for at least 2 midnights  Estimated Length of Stay: 3          B Medical/Surgery History Past Medical History:  Diagnosis Date   Arthritis    knee, back   COVID-19    2020   GERD (gastroesophageal reflux disease)    Hyperlipidemia    Past Surgical History:  Procedure Laterality Date   ANTERIOR CRUCIATE LIGAMENT REPAIR     left   AUGMENTATION MAMMAPLASTY Bilateral 2021   re-do   BREAST BIOPSY Bilateral    multiple times per  patient   BREAST SURGERY     mutiple bx   COLONOSCOPY  04/04/2008   at [facility] done   INTRAMEDULLARY (IM) NAIL INTERTROCHANTERIC Left 05/27/2022   Procedure: INTRAMEDULLARY (IM) NAIL INTERTROCHANTERIC;  Surgeon: Shona Needles, MD;  Location: [place];  Service: Orthopedics;  Laterality: Left;   KNEE ARTHROSCOPY  05/20/2012   Procedure: ARTHROSCOPY KNEE;  Surgeon: Johnn Hai, MD;  Location: [facility];  Service: Orthopedics;  Laterality: Right;  right knee scope with incision and drainage and removal of loose foreign body    KNEE ARTHROSCOPY WITH MEDIAL MENISECTOMY  04/25/2012   Procedure: KNEE ARTHROSCOPY WITH MEDIAL MENISECTOMY;  Surgeon: Johnn Hai, MD;  Location: [place];  Service: Orthopedics;  Laterality: Right;  Right Knee Arthrscopy with Partial Medial and Lateral Menisectomy with Removal of Loose Bodies   TONSILLECTOMY     TOTAL KNEE ARTHROPLASTY Left 04/28/2021   Procedure: TOTAL KNEE ARTHROPLASTY, REMOVAL OF HARDWARE (ACL SCREWS);  Surgeon: Susa Day, MD;  Location: [facility];  Service: Orthopedics;  Laterality: Left;     A IV Location/Drains/Wounds Patient Lines/Drains/Airways Status     Active Line/Drains/Airways     Name Placement date Placement time Site Days   Peripheral IV 06/11/22 20 G Anterior;Left Forearm 06/11/22  1125  Forearm  less than 1   Closed System Drain 1 Right;Superior Knee Accordion (Hemovac) 10  Fr. 05/20/12  1340  Knee  3674            Intake/Output Last 24 hours No intake or output data in the 24 hours ending 06/11/22 1251  Labs/Imaging No results found for this or any previous visit (from the past 48 hour(s)). DG Knee 2 Views Left  Result Date: 06/11/2022 CLINICAL DATA:  Left leg pain EXAM: LEFT KNEE - 1-2 VIEW COMPARISON:  Prior radiographs 05/27/2022 FINDINGS: The distal interlocking screw has significantly backed out compared to the prior radiographs. The screw is now backed out by 1.7 cm in  a lateral direction. Diffuse reticulation of the subcutaneous fat consistent with edema versus cellulitis. Prior surgical changes of total knee arthroplasty. IMPRESSION: Significant interval displacement of the distal interlocking screw which is now backed out by approximately 1.7 cm compared to prior radiographs from January of 2024. Surgical changes of prior total knee arthroplasty without evidence of complication. Subcutaneous edema versus cellulitis. Electronically Signed   By: Jacqulynn Cadet M.D.   On: 06/11/2022 12:09   DG Femur Min 2 Views Left  Result Date: 06/11/2022 CLINICAL DATA:  Follow-up left proximal femur fracture ORIF. EXAM: LEFT FEMUR 2 VIEWS COMPARISON:  05/27/2022. FINDINGS: Since the most recent prior study, there has a change in the orthopedic hardware. The distal fixation screw has migrated laterally, now extending 1.4 cm lateral to the lateral cortical margin. The tip of the screw lies within the intramedullary rod. The proximal fracture components now show mild displacement, more inferior and medial fracture component displaced medially by 7 mm. Greater trochanter component displaced laterally by 7 mm. The femoral neck/head screws appear well seated. No new fractures.  Knee and hip joints remain normally aligned. Lateral soft tissue edema has significantly improved. IMPRESSION: 1. Interval movement of the orthopedic fixation hardware as detailed above with mild displacement of the proximal fracture components compared to the immediate postoperative images. No new fractures. Electronically Signed   By: Lajean Manes M.D.   On: 06/11/2022 11:20    Pending Labs [employer] (From admission, onward)     Start     Ordered   Signed and Held  Creatinine, serum  (enoxaparin (LOVENOX)    CrCl >/= 30 ml/min)  Weekly,   R     Comments: while on enoxaparin therapy    Signed and Held   Signed and Held  CBC  Once,   R        Signed and Held   Signed and Held  Basic metabolic panel   Once,   R        Signed and Held            Vitals/Pain Today's Vitals   06/11/22 1027 06/11/22 1032 06/11/22 1216  BP: (!) 151/86    Pulse: 86    Resp: 16    Temp: 98.2 F (36.8 C)    TempSrc: Oral    SpO2: 100%    PainSc:  7  3     Isolation Precautions No active isolations  Medications Medications  morphine (PF) 4 MG/ML injection 4 mg (4 mg Intravenous Given 06/11/22 1128)  ondansetron (ZOFRAN) injection 4 mg (4 mg Intravenous Given 06/11/22 1128)    Mobility non-ambulatory     Focused Assessments Musculoskeletal-tenderness and bruising to LLE. Limited movement    R Recommendations: See Admitting Provider Note  Report given to:   Additional Notes:

## 2022-06-11 NOTE — ED Notes (Signed)
Patient transported to X-ray 

## 2022-06-11 NOTE — Consult Note (Signed)
Reason for Consult:Left leg pain Referring Physician: Malvin Murray Time called: 1122 Time at bedside: [name] is an 67 y.o. female.  HPI: Julia Murray was walking at home yesterday when she heard and felt a pop near her knee. She had immediate pain and could not bear weight. She spent the night on the couch and when it was no better today she came to the ED and orthopedic surgery was consulted. She feels she was making decent progress before this happened.  Past Medical History:  Diagnosis Date   Arthritis    knee, back   COVID-19    2020   GERD (gastroesophageal reflux disease)    Hyperlipidemia     Past Surgical History:  Procedure Laterality Date   ANTERIOR CRUCIATE LIGAMENT REPAIR     left   AUGMENTATION MAMMAPLASTY Bilateral 2021   re-do   BREAST BIOPSY Bilateral    multiple times per patient   BREAST SURGERY     mutiple bx   COLONOSCOPY  04/04/2008   at [facility] done   INTRAMEDULLARY (IM) NAIL INTERTROCHANTERIC Left 05/27/2022   Procedure: INTRAMEDULLARY (IM) NAIL INTERTROCHANTERIC;  Surgeon: Shona Needles, MD;  Location: [place];  Service: Orthopedics;  Laterality: Left;   KNEE ARTHROSCOPY  05/20/2012   Procedure: ARTHROSCOPY KNEE;  Surgeon: Johnn Hai, MD;  Location: [facility];  Service: Orthopedics;  Laterality: Right;  right knee scope with incision and drainage and removal of loose foreign body    KNEE ARTHROSCOPY WITH MEDIAL MENISECTOMY  04/25/2012   Procedure: KNEE ARTHROSCOPY WITH MEDIAL MENISECTOMY;  Surgeon: Johnn Hai, MD;  Location: [place];  Service: Orthopedics;  Laterality: Right;  Right Knee Arthrscopy with Partial Medial and Lateral Menisectomy with Removal of Loose Bodies   TONSILLECTOMY     TOTAL KNEE ARTHROPLASTY Left 04/28/2021   Procedure: TOTAL KNEE ARTHROPLASTY, REMOVAL OF HARDWARE (ACL SCREWS);  Surgeon: Susa Day, MD;  Location: [facility];  Service: Orthopedics;  Laterality: Left;     Family History  Problem Relation Age of Onset   Heart disease Father    Heart attack Father    Diabetes Mother    Colon cancer Neg Hx    Esophageal cancer Neg Hx    Rectal cancer Neg Hx    Stomach cancer Neg Hx    Colon polyps Neg Hx     Social History:  reports that she has never smoked. She has never used smokeless tobacco. She reports current alcohol use of about 8.0 standard drinks of alcohol per week. She reports that she does not use drugs.  Allergies:  Allergies  Allergen Reactions   Shellfish Allergy Hives and Swelling    Pt States betadine ok   Bee Venom Swelling   Crab Extract Allergy Skin Test Hives and Swelling    Medications: I have reviewed the patient's current medications.  No results found for this or any previous visit (from the past 48 hour(s)).  DG Knee 2 Views Left  Result Date: 06/11/2022 CLINICAL DATA:  Left leg pain EXAM: LEFT KNEE - 1-2 VIEW COMPARISON:  Prior radiographs 05/27/2022 FINDINGS: The distal interlocking screw has significantly backed out compared to the prior radiographs. The screw is now backed out by 1.7 cm in a lateral direction. Diffuse reticulation of the subcutaneous fat consistent with edema versus cellulitis. Prior surgical changes of total knee arthroplasty. IMPRESSION: Significant interval displacement of the distal interlocking screw which is now backed out by approximately  1.7 cm compared to prior radiographs from January of 2024. Surgical changes of prior total knee arthroplasty without evidence of complication. Subcutaneous edema versus cellulitis. Electronically Signed   By: Jacqulynn Cadet M.D.   On: 06/11/2022 12:09   DG Femur Min 2 Views Left  Result Date: 06/11/2022 CLINICAL DATA:  Follow-up left proximal femur fracture ORIF. EXAM: LEFT FEMUR 2 VIEWS COMPARISON:  05/27/2022. FINDINGS: Since the most recent prior study, there has a change in the orthopedic hardware. The distal fixation screw has migrated laterally, now  extending 1.4 cm lateral to the lateral cortical margin. The tip of the screw lies within the intramedullary rod. The proximal fracture components now show mild displacement, more inferior and medial fracture component displaced medially by 7 mm. Greater trochanter component displaced laterally by 7 mm. The femoral neck/head screws appear well seated. No new fractures.  Knee and hip joints remain normally aligned. Lateral soft tissue edema has significantly improved. IMPRESSION: 1. Interval movement of the orthopedic fixation hardware as detailed above with mild displacement of the proximal fracture components compared to the immediate postoperative images. No new fractures. Electronically Signed   By: Lajean Manes M.D.   On: 06/11/2022 11:20    Review of Systems  HENT:  Negative for ear discharge, ear pain, hearing loss and tinnitus.   Eyes:  Negative for photophobia and pain.  Respiratory:  Negative for cough and shortness of breath.   Cardiovascular:  Negative for chest pain.  Gastrointestinal:  Negative for abdominal pain, nausea and vomiting.  Genitourinary:  Negative for dysuria, flank pain, frequency and urgency.  Musculoskeletal:  Positive for arthralgias (Left knee). Negative for back pain, myalgias and neck pain.  Neurological:  Negative for dizziness and headaches.  Hematological:  Does not bruise/bleed easily.  Psychiatric/Behavioral:  The patient is not nervous/anxious.    Blood pressure (!) 151/86, pulse 86, temperature 98.2 F (36.8 C), temperature source Oral, resp. rate 16, SpO2 100 %. Physical Exam Constitutional:      General: She is not in acute distress.    Appearance: She is well-developed. She is not diaphoretic.  HENT:     Head: Normocephalic and atraumatic.  Eyes:     General: No scleral icterus.       Right eye: No discharge.        Left eye: No discharge.     Conjunctiva/sclera: Conjunctivae normal.  Cardiovascular:     Rate and Rhythm: Normal rate and regular  rhythm.  Pulmonary:     Effort: Pulmonary effort is normal. No respiratory distress.  Musculoskeletal:     Cervical back: Normal range of motion.     Comments: LLE No traumatic wounds or rash, evolving ecchymoses  Incisions C/D/I  No knee or ankle effusion  Sens DPN, SPN, TN intact  Motor EHL, ext, flex, evers 5/5  DP 2+, PT 2+, No significant edema  Skin:    General: Skin is warm and dry.  Neurological:     Mental Status: She is alert.  Psychiatric:        Mood and Affect: Mood normal.        Behavior: Behavior normal.     Assessment/Plan: S/p left femur IMN -- Will get CT, will need some sort of surgery depending on what that shows.    Lisette Abu, PA-C Orthopedic Surgery [phone] 06/11/2022, 12:41 PM

## 2022-06-11 NOTE — H&P (View-Only) (Signed)
Reason for Consult:Left leg pain Referring Physician: Malvin Johns Time called: 1122 Time at bedside: [place] is an 67 y.o. female.  HPI: Julia Murray was walking at home yesterday when she heard and felt a pop near her knee. She had immediate pain and could not bear weight. She spent the night on the couch and when it was no better today she came to the ED and orthopedic surgery was consulted. She feels she was making decent progress before this happened.  Past Medical History:  Diagnosis Date   Arthritis    knee, back   COVID-19    2020   GERD (gastroesophageal reflux disease)    Hyperlipidemia     Past Surgical History:  Procedure Laterality Date   ANTERIOR CRUCIATE LIGAMENT REPAIR     left   AUGMENTATION MAMMAPLASTY Bilateral 2021   re-do   BREAST BIOPSY Bilateral    multiple times per patient   BREAST SURGERY     mutiple bx   COLONOSCOPY  04/04/2008   at [facility] done   INTRAMEDULLARY (IM) NAIL INTERTROCHANTERIC Left 05/27/2022   Procedure: INTRAMEDULLARY (IM) NAIL INTERTROCHANTERIC;  Surgeon: Shona Needles, MD;  Location: [place];  Service: Orthopedics;  Laterality: Left;   KNEE ARTHROSCOPY  05/20/2012   Procedure: ARTHROSCOPY KNEE;  Surgeon: Johnn Hai, MD;  Location: [facility];  Service: Orthopedics;  Laterality: Right;  right knee scope with incision and drainage and removal of loose foreign body    KNEE ARTHROSCOPY WITH MEDIAL MENISECTOMY  04/25/2012   Procedure: KNEE ARTHROSCOPY WITH MEDIAL MENISECTOMY;  Surgeon: Johnn Hai, MD;  Location: [place];  Service: Orthopedics;  Laterality: Right;  Right Knee Arthrscopy with Partial Medial and Lateral Menisectomy with Removal of Loose Bodies   TONSILLECTOMY     TOTAL KNEE ARTHROPLASTY Left 04/28/2021   Procedure: TOTAL KNEE ARTHROPLASTY, REMOVAL OF HARDWARE (ACL SCREWS);  Surgeon: Susa Day, MD;  Location[facility];  Service: Orthopedics;  Laterality: Left;     Family History  Problem Relation Age of Onset   Heart disease Father    Heart attack Father    Diabetes Mother    Colon cancer Neg Hx    Esophageal cancer Neg Hx    Rectal cancer Neg Hx    Stomach cancer Neg Hx    Colon polyps Neg Hx     Social History:  reports that she has never smoked. She has never used smokeless tobacco. She reports current alcohol use of about 8.0 standard drinks of alcohol per week. She reports that she does not use drugs.  Allergies:  Allergies  Allergen Reactions   Shellfish Allergy Hives and Swelling    Pt States betadine ok   Bee Venom Swelling   Crab Extract Allergy Skin Test Hives and Swelling    Medications: I have reviewed the patient's current medications.  No results found for this or any previous visit (from the past 48 hour(s)).  DG Knee 2 Views Left  Result Date: 06/11/2022 CLINICAL DATA:  Left leg pain EXAM: LEFT KNEE - 1-2 VIEW COMPARISON:  Prior radiographs 05/27/2022 FINDINGS: The distal interlocking screw has significantly backed out compared to the prior radiographs. The screw is now backed out by 1.7 cm in a lateral direction. Diffuse reticulation of the subcutaneous fat consistent with edema versus cellulitis. Prior surgical changes of total knee arthroplasty. IMPRESSION: Significant interval displacement of the distal interlocking screw which is now backed out by approximately  1.7 cm compared to prior radiographs from January of 2024. Surgical changes of prior total knee arthroplasty without evidence of complication. Subcutaneous edema versus cellulitis. Electronically Signed   By: Jacqulynn Cadet M.D.   On: 06/11/2022 12:09   DG Femur Min 2 Views Left  Result Date: 06/11/2022 CLINICAL DATA:  Follow-up left proximal femur fracture ORIF. EXAM: LEFT FEMUR 2 VIEWS COMPARISON:  05/27/2022. FINDINGS: Since the most recent prior study, there has a change in the orthopedic hardware. The distal fixation screw has migrated laterally, now  extending 1.4 cm lateral to the lateral cortical margin. The tip of the screw lies within the intramedullary rod. The proximal fracture components now show mild displacement, more inferior and medial fracture component displaced medially by 7 mm. Greater trochanter component displaced laterally by 7 mm. The femoral neck/head screws appear well seated. No new fractures.  Knee and hip joints remain normally aligned. Lateral soft tissue edema has significantly improved. IMPRESSION: 1. Interval movement of the orthopedic fixation hardware as detailed above with mild displacement of the proximal fracture components compared to the immediate postoperative images. No new fractures. Electronically Signed   By: Lajean Manes M.D.   On: 06/11/2022 11:20    Review of Systems  HENT:  Negative for ear discharge, ear pain, hearing loss and tinnitus.   Eyes:  Negative for photophobia and pain.  Respiratory:  Negative for cough and shortness of breath.   Cardiovascular:  Negative for chest pain.  Gastrointestinal:  Negative for abdominal pain, nausea and vomiting.  Genitourinary:  Negative for dysuria, flank pain, frequency and urgency.  Musculoskeletal:  Positive for arthralgias (Left knee). Negative for back pain, myalgias and neck pain.  Neurological:  Negative for dizziness and headaches.  Hematological:  Does not bruise/bleed easily.  Psychiatric/Behavioral:  The patient is not nervous/anxious.    Blood pressure (!) 151/86, pulse 86, temperature 98.2 F (36.8 C), temperature source Oral, resp. rate 16, SpO2 100 %. Physical Exam Constitutional:      General: She is not in acute distress.    Appearance: She is well-developed. She is not diaphoretic.  HENT:     Head: Normocephalic and atraumatic.  Eyes:     General: No scleral icterus.       Right eye: No discharge.        Left eye: No discharge.     Conjunctiva/sclera: Conjunctivae normal.  Cardiovascular:     Rate and Rhythm: Normal rate and regular  rhythm.  Pulmonary:     Effort: Pulmonary effort is normal. No respiratory distress.  Musculoskeletal:     Cervical back: Normal range of motion.     Comments: LLE No traumatic wounds or rash, evolving ecchymoses  Incisions C/D/I  No knee or ankle effusion  Sens DPN, SPN, TN intact  Motor EHL, ext, flex, evers 5/5  DP 2+, PT 2+, No significant edema  Skin:    General: Skin is warm and dry.  Neurological:     Mental Status: She is alert.  Psychiatric:        Mood and Affect: Mood normal.        Behavior: Behavior normal.     Assessment/Plan: S/p left femur IMN -- Will get CT, will need some sort of surgery depending on what that shows.    Lisette Abu, PA-C Orthopedic Surgery [phone] 06/11/2022, 12:41 PM

## 2022-06-12 ENCOUNTER — Encounter (HOSPITAL_COMMUNITY): Payer: Self-pay | Admitting: Student

## 2022-06-12 ENCOUNTER — Inpatient Hospital Stay (HOSPITAL_COMMUNITY): Payer: Medicare Other

## 2022-06-12 ENCOUNTER — Encounter (HOSPITAL_COMMUNITY)
Admission: EM | Disposition: A | Discharge status: Home Health | Payer: Self-pay | Source: Home / Self Care | Attending: Student

## 2022-06-12 ENCOUNTER — Inpatient Hospital Stay (HOSPITAL_COMMUNITY): Payer: Medicare Other | Admitting: Certified Registered"

## 2022-06-12 DIAGNOSIS — M1990 Unspecified osteoarthritis, unspecified site: Secondary | ICD-10-CM

## 2022-06-12 DIAGNOSIS — T84115A Breakdown (mechanical) of internal fixation device of left femur, initial encounter: Secondary | ICD-10-CM

## 2022-06-12 DIAGNOSIS — F419 Anxiety disorder, unspecified: Secondary | ICD-10-CM

## 2022-06-12 HISTORY — PX: HARDWARE REMOVAL: SHX979

## 2022-06-12 SURGERY — REMOVAL, HARDWARE
Anesthesia: General | Site: Thigh | Laterality: Left

## 2022-06-12 MED ORDER — SCOPOLAMINE 1 MG/3DAYS TD PT72
1.0000 | MEDICATED_PATCH | TRANSDERMAL | Status: DC
  Administered 2022-06-12: 1.5 mg via TRANSDERMAL

## 2022-06-12 MED ORDER — ONDANSETRON HCL 4 MG/2ML IJ SOLN
INTRAMUSCULAR | Status: AC
  Filled 2022-06-12: qty 2

## 2022-06-12 MED ORDER — LIDOCAINE 2% (20 MG/ML) 5 ML SYRINGE
INTRAMUSCULAR | Status: AC
  Filled 2022-06-12: qty 5

## 2022-06-12 MED ORDER — PROPOFOL 10 MG/ML IV BOLUS
INTRAVENOUS | Status: DC | PRN
  Administered 2022-06-12: 130 mg via INTRAVENOUS

## 2022-06-12 MED ORDER — POTASSIUM CHLORIDE 2 MEQ/ML IV SOLN: INTRAVENOUS | Status: DC

## 2022-06-12 MED ORDER — ACETAMINOPHEN 325 MG PO TABS
650.0000 mg | ORAL_TABLET | Freq: Four times a day (QID) | ORAL | Status: DC
  Administered 2022-06-12 – 2022-06-13 (×4): 650 mg via ORAL
  Filled 2022-06-12 (×4): qty 2

## 2022-06-12 MED ORDER — FENTANYL CITRATE (PF) 100 MCG/2ML IJ SOLN
25.0000 ug | INTRAMUSCULAR | Status: DC | PRN
  Administered 2022-06-12 (×3): 50 ug via INTRAVENOUS

## 2022-06-12 MED ORDER — ORAL CARE MOUTH RINSE: 15.0000 mL | Freq: Once | OROMUCOSAL | Status: AC

## 2022-06-12 MED ORDER — POLYETHYLENE GLYCOL 3350 17 G PO PACK: 17.0000 g | PACK | Freq: Every day | ORAL | Status: DC | PRN

## 2022-06-12 MED ORDER — MIDAZOLAM HCL 2 MG/2ML IJ SOLN
INTRAMUSCULAR | Status: DC | PRN
  Administered 2022-06-12: 2 mg via INTRAVENOUS

## 2022-06-12 MED ORDER — SODIUM CHLORIDE 0.9 % IV SOLN: INTRAVENOUS | Status: DC

## 2022-06-12 MED ORDER — LACTATED RINGERS IV SOLN: INTRAVENOUS | Status: DC

## 2022-06-12 MED ORDER — CEFAZOLIN SODIUM-DEXTROSE 2-4 GM/100ML-% IV SOLN
2.0000 g | Freq: Once | INTRAVENOUS | Status: AC
  Administered 2022-06-12: 2 g via INTRAVENOUS

## 2022-06-12 MED ORDER — LIDOCAINE 2% (20 MG/ML) 5 ML SYRINGE
INTRAMUSCULAR | Status: DC | PRN
  Administered 2022-06-12: 100 mg via INTRAVENOUS

## 2022-06-12 MED ORDER — CHLORHEXIDINE GLUCONATE 0.12 % MT SOLN: 15.0000 mL | Freq: Once | OROMUCOSAL | Status: AC

## 2022-06-12 MED ORDER — PROMETHAZINE HCL 25 MG/ML IJ SOLN: 6.2500 mg | INTRAMUSCULAR | Status: DC | PRN

## 2022-06-12 MED ORDER — ADULT MULTIVITAMIN W/MINERALS CH
1.0000 | ORAL_TABLET | Freq: Every day | ORAL | Status: DC
  Administered 2022-06-12 – 2022-06-13 (×2): 1 via ORAL
  Filled 2022-06-12 (×2): qty 1

## 2022-06-12 MED ORDER — FENTANYL CITRATE (PF) 100 MCG/2ML IJ SOLN
INTRAMUSCULAR | Status: AC
  Filled 2022-06-12: qty 2

## 2022-06-12 MED ORDER — FENTANYL CITRATE (PF) 100 MCG/2ML IJ SOLN
INTRAMUSCULAR | Status: DC | PRN
  Administered 2022-06-12: 100 ug via INTRAVENOUS

## 2022-06-12 MED ORDER — PROPOFOL 10 MG/ML IV BOLUS
INTRAVENOUS | Status: AC
  Filled 2022-06-12: qty 20

## 2022-06-12 MED ORDER — FENTANYL CITRATE (PF) 250 MCG/5ML IJ SOLN
INTRAMUSCULAR | Status: AC
  Filled 2022-06-12: qty 5

## 2022-06-12 MED ORDER — PHENYLEPHRINE 80 MCG/ML (10ML) SYRINGE FOR IV PUSH (FOR BLOOD PRESSURE SUPPORT)
PREFILLED_SYRINGE | INTRAVENOUS | Status: DC | PRN
  Administered 2022-06-12: 160 ug via INTRAVENOUS
  Administered 2022-06-12: 80 ug via INTRAVENOUS

## 2022-06-12 MED ORDER — SUGAMMADEX SODIUM 500 MG/5ML IV SOLN
INTRAVENOUS | Status: AC
  Filled 2022-06-12: qty 5

## 2022-06-12 MED ORDER — ROCURONIUM BROMIDE 10 MG/ML (PF) SYRINGE
PREFILLED_SYRINGE | INTRAVENOUS | Status: AC
  Filled 2022-06-12: qty 10

## 2022-06-12 MED ORDER — 0.9 % SODIUM CHLORIDE (POUR BTL) OPTIME
TOPICAL | Status: DC | PRN
  Administered 2022-06-12: 1000 mL

## 2022-06-12 MED ORDER — ONDANSETRON HCL 4 MG/2ML IJ SOLN
INTRAMUSCULAR | Status: DC | PRN
  Administered 2022-06-12: 4 mg via INTRAVENOUS

## 2022-06-12 MED ORDER — FLUTICASONE PROPIONATE 50 MCG/ACT NA SUSP
2.0000 | Freq: Every day | NASAL | Status: DC
  Administered 2022-06-12 – 2022-06-13 (×2): 2 via NASAL
  Filled 2022-06-12: qty 16

## 2022-06-12 MED ORDER — DEXAMETHASONE SODIUM PHOSPHATE 10 MG/ML IJ SOLN
INTRAMUSCULAR | Status: AC
  Filled 2022-06-12: qty 1

## 2022-06-12 MED ORDER — CEFAZOLIN SODIUM-DEXTROSE 2-4 GM/100ML-% IV SOLN
INTRAVENOUS | Status: AC
  Filled 2022-06-12: qty 100

## 2022-06-12 MED ORDER — CHLORHEXIDINE GLUCONATE 0.12 % MT SOLN
OROMUCOSAL | Status: AC
  Administered 2022-06-12: 15 mL via OROMUCOSAL
  Filled 2022-06-12: qty 15

## 2022-06-12 MED ORDER — SCOPOLAMINE 1 MG/3DAYS TD PT72
MEDICATED_PATCH | TRANSDERMAL | Status: AC
  Filled 2022-06-12: qty 1

## 2022-06-12 MED ORDER — PANTOPRAZOLE SODIUM 40 MG PO TBEC
40.0000 mg | DELAYED_RELEASE_TABLET | Freq: Every day | ORAL | Status: DC
  Administered 2022-06-12 – 2022-06-13 (×2): 40 mg via ORAL
  Filled 2022-06-12 (×2): qty 1

## 2022-06-12 MED ORDER — MORPHINE SULFATE (PF) 2 MG/ML IV SOLN: 1.0000 mg | INTRAVENOUS | Status: DC | PRN

## 2022-06-12 MED ORDER — METOCLOPRAMIDE HCL 5 MG/ML IJ SOLN: 5.0000 mg | Freq: Three times a day (TID) | INTRAMUSCULAR | Status: DC | PRN

## 2022-06-12 MED ORDER — ATORVASTATIN CALCIUM 10 MG PO TABS
20.0000 mg | ORAL_TABLET | Freq: Every day | ORAL | Status: DC
  Administered 2022-06-12 – 2022-06-13 (×2): 20 mg via ORAL
  Filled 2022-06-12 (×2): qty 2

## 2022-06-12 MED ORDER — SUGAMMADEX SODIUM 200 MG/2ML IV SOLN
INTRAVENOUS | Status: DC | PRN
  Administered 2022-06-12: 500 mg via INTRAVENOUS

## 2022-06-12 MED ORDER — ROCURONIUM BROMIDE 10 MG/ML (PF) SYRINGE
PREFILLED_SYRINGE | INTRAVENOUS | Status: DC | PRN
  Administered 2022-06-12: 70 mg via INTRAVENOUS

## 2022-06-12 MED ORDER — MIDAZOLAM HCL 2 MG/2ML IJ SOLN
INTRAMUSCULAR | Status: AC
  Filled 2022-06-12: qty 2

## 2022-06-12 MED ORDER — METOCLOPRAMIDE HCL 5 MG PO TABS: 5.0000 mg | ORAL_TABLET | Freq: Three times a day (TID) | ORAL | Status: DC | PRN

## 2022-06-12 MED ORDER — AMISULPRIDE (ANTIEMETIC) 5 MG/2ML IV SOLN: 10.0000 mg | Freq: Once | INTRAVENOUS | Status: DC | PRN

## 2022-06-12 MED ORDER — DOCUSATE SODIUM 100 MG PO CAPS
100.0000 mg | ORAL_CAPSULE | Freq: Two times a day (BID) | ORAL | Status: DC
  Administered 2022-06-12 – 2022-06-13 (×3): 100 mg via ORAL
  Filled 2022-06-12 (×3): qty 1

## 2022-06-12 MED ORDER — DEXAMETHASONE SODIUM PHOSPHATE 10 MG/ML IJ SOLN
INTRAMUSCULAR | Status: DC | PRN
  Administered 2022-06-12: 5 mg via INTRAVENOUS

## 2022-06-12 MED ORDER — KCL IN DEXTROSE-NACL 10-5-0.45 MEQ/L-%-% IV SOLN
INTRAVENOUS | Status: DC
  Filled 2022-06-12 (×2): qty 1000

## 2022-06-12 MED ORDER — CEFAZOLIN SODIUM-DEXTROSE 2-4 GM/100ML-% IV SOLN
2.0000 g | Freq: Three times a day (TID) | INTRAVENOUS | Status: AC
  Administered 2022-06-12 – 2022-06-13 (×3): 2 g via INTRAVENOUS
  Filled 2022-06-12 (×3): qty 100

## 2022-06-12 SURGICAL SUPPLY — 69 items
ADH SKN CLS APL DERMABOND .7 (GAUZE/BANDAGES/DRESSINGS) ×1
APL PRP STRL LF DISP 70% ISPRP (MISCELLANEOUS) ×1
BAG COUNTER SPONGE SURGICOUNT (BAG) ×2 IMPLANT
BAG SPNG CNTER NS LX DISP (BAG) ×1
BANDAGE ESMARK 6X9 LF (GAUZE/BANDAGES/DRESSINGS) ×2 IMPLANT
BIT DRILL SHORT 4.0 (BIT) IMPLANT
BNDG CMPR 9X6 STRL LF SNTH (GAUZE/BANDAGES/DRESSINGS) ×1
BNDG COHESIVE 6X5 TAN STRL LF (GAUZE/BANDAGES/DRESSINGS) ×2 IMPLANT
BNDG ELASTIC 4X5.8 VLCR STR LF (GAUZE/BANDAGES/DRESSINGS) ×2 IMPLANT
BNDG ELASTIC 6X5.8 VLCR STR LF (GAUZE/BANDAGES/DRESSINGS) ×2 IMPLANT
BNDG ESMARK 6X9 LF (GAUZE/BANDAGES/DRESSINGS) ×1
BNDG GAUZE DERMACEA FLUFF 4 (GAUZE/BANDAGES/DRESSINGS) ×4 IMPLANT
BNDG GZE DERMACEA 4 6PLY (GAUZE/BANDAGES/DRESSINGS) ×2
BRUSH SCRUB EZ PLAIN DRY (MISCELLANEOUS) ×4 IMPLANT
CHLORAPREP W/TINT 26 (MISCELLANEOUS) ×2 IMPLANT
COVER SURGICAL LIGHT HANDLE (MISCELLANEOUS) ×4 IMPLANT
CUFF TOURN SGL QUICK 18X4 (TOURNIQUET CUFF) IMPLANT
CUFF TOURN SGL QUICK 24 (TOURNIQUET CUFF)
CUFF TOURN SGL QUICK 34 (TOURNIQUET CUFF)
CUFF TRNQT CYL 24X4X16.5-23 (TOURNIQUET CUFF) IMPLANT
CUFF TRNQT CYL 34X4.125X (TOURNIQUET CUFF) IMPLANT
DERMABOND ADVANCED .7 DNX12 (GAUZE/BANDAGES/DRESSINGS) IMPLANT
DRAPE C-ARM 42X72 X-RAY (DRAPES) IMPLANT
DRAPE C-ARMOR (DRAPES) ×2 IMPLANT
DRAPE U-SHAPE 47X51 STRL (DRAPES) ×2 IMPLANT
DRESSING MEPILEX FLEX 4X4 (GAUZE/BANDAGES/DRESSINGS) IMPLANT
DRILL BIT SHORT 4.0 (BIT) ×1
DRSG ADAPTIC 3X8 NADH LF (GAUZE/BANDAGES/DRESSINGS) ×2 IMPLANT
DRSG MEPILEX FLEX 4X4 (GAUZE/BANDAGES/DRESSINGS) ×1
ELECT REM PT RETURN 9FT ADLT (ELECTROSURGICAL) ×1
ELECTRODE REM PT RTRN 9FT ADLT (ELECTROSURGICAL) ×2 IMPLANT
GAUZE SPONGE 4X4 12PLY STRL (GAUZE/BANDAGES/DRESSINGS) ×2 IMPLANT
GLOVE BIO SURGEON STRL SZ 6.5 (GLOVE) ×6 IMPLANT
GLOVE BIO SURGEON STRL SZ7.5 (GLOVE) ×8 IMPLANT
GLOVE BIOGEL PI IND STRL 6.5 (GLOVE) ×2 IMPLANT
GLOVE BIOGEL PI IND STRL 7.5 (GLOVE) ×2 IMPLANT
GOWN STRL REUS W/ TWL LRG LVL3 (GOWN DISPOSABLE) ×4 IMPLANT
GOWN STRL REUS W/TWL LRG LVL3 (GOWN DISPOSABLE) ×2
KIT BASIN OR (CUSTOM PROCEDURE TRAY) ×2 IMPLANT
KIT TURNOVER KIT B (KITS) ×2 IMPLANT
MANIFOLD NEPTUNE II (INSTRUMENTS) ×2 IMPLANT
NDL 22X1.5 STRL (OR ONLY) (MISCELLANEOUS) IMPLANT
NEEDLE 22X1.5 STRL (OR ONLY) (MISCELLANEOUS) IMPLANT
NS IRRIG 1000ML POUR BTL (IV SOLUTION) ×2 IMPLANT
PACK ORTHO EXTREMITY (CUSTOM PROCEDURE TRAY) ×2 IMPLANT
PAD ARMBOARD 7.5X6 YLW CONV (MISCELLANEOUS) ×4 IMPLANT
PADDING CAST COTTON 6X4 STRL (CAST SUPPLIES) ×6 IMPLANT
SCREW TRIGEN LOW PROF 5.0X52.5 (Screw) IMPLANT
SPONGE T-LAP 18X18 ~~LOC~~+RFID (SPONGE) ×2 IMPLANT
STAPLER VISISTAT 35W (STAPLE) IMPLANT
STOCKINETTE IMPERVIOUS LG (DRAPES) ×2 IMPLANT
STRIP CLOSURE SKIN 1/2X4 (GAUZE/BANDAGES/DRESSINGS) IMPLANT
SUCTION FRAZIER HANDLE 10FR (MISCELLANEOUS)
SUCTION TUBE FRAZIER 10FR DISP (MISCELLANEOUS) IMPLANT
SUT ETHILON 3 0 PS 1 (SUTURE) IMPLANT
SUT MNCRL AB 3-0 PS2 18 (SUTURE) ×2 IMPLANT
SUT MON AB 2-0 CT1 36 (SUTURE) ×2 IMPLANT
SUT PDS AB 2-0 CT1 27 (SUTURE) IMPLANT
SUT VIC AB 0 CT1 27 (SUTURE)
SUT VIC AB 0 CT1 27XBRD ANBCTR (SUTURE) IMPLANT
SUT VIC AB 2-0 CT1 27 (SUTURE)
SUT VIC AB 2-0 CT1 TAPERPNT 27 (SUTURE) IMPLANT
SYR CONTROL 10ML LL (SYRINGE) IMPLANT
TOWEL GREEN STERILE (TOWEL DISPOSABLE) ×4 IMPLANT
TOWEL GREEN STERILE FF (TOWEL DISPOSABLE) ×4 IMPLANT
TUBE CONNECTING 12X1/4 (SUCTIONS) ×2 IMPLANT
UNDERPAD 30X36 HEAVY ABSORB (UNDERPADS AND DIAPERS) ×2 IMPLANT
WATER STERILE IRR 1000ML POUR (IV SOLUTION) ×4 IMPLANT
YANKAUER SUCT BULB TIP NO VENT (SUCTIONS) ×2 IMPLANT

## 2022-06-12 NOTE — Evaluation (Signed)
Physical Therapy Evaluation Patient Details Name: Julia Murray MRN: [id number] DOB: Oct 18, 1955 Today's Date: 06/12/2022  History of Present Illness  67 y/o female presented 2/5 for LLE pain, on 2/6 s/p EXCHANGE DISTAL FEMUR INTERLOCKING SCREWS LLE. Recent cephalomedullary nailing on 1/21 by Dr. Doreatha Martin after sustaining a femur fracture to same leg. PMH Lt ACL repair, L TKA, Rt knee scope 2013.  Clinical Impression  Patient is s/p above surgery resulting in functional limitations due to the deficits listed below (see PT Problem List). Min assist for bed mobility, min guard for transfer and limited distance steps today but maintains TDWB on LLE at all times. Pt pleased with ability post-op day #0 and states husband will be able to assist at home without issues. Will need follow-up PT to ensure progressing with gait and to navigate single step to enter home. Likely appropriate for OPPT when cleared by surgeon however will update as appropriate depending how she performs tomorrow.  Patient will benefit from skilled PT to increase their independence and safety with mobility to allow discharge to the venue listed below.          Recommendations for follow up therapy are one component of a multi-disciplinary discharge planning process, led by the attending physician.  Recommendations may be updated based on patient status, additional functional criteria and insurance authorization.  Follow Up Recommendations Outpatient PT (When appropriate per post-op protocol; may update pending how she does post op day 1)      Assistance Recommended at Discharge Intermittent Supervision/Assistance  Patient can return home with the following  A little help with walking and/or transfers;A little help with bathing/dressing/bathroom;Assistance with cooking/housework;Assist for transportation;Help with stairs or ramp for entrance    Equipment Recommendations Rolling walker (2 wheels)  Recommendations for Other  Services       Functional Status Assessment Patient has had a recent decline in their functional status and demonstrates the ability to make significant improvements in function in a reasonable and predictable amount of time.     Precautions / Restrictions Precautions Precautions: Fall Restrictions Weight Bearing Restrictions: Yes LLE Weight Bearing: Touchdown weight bearing      Mobility  Bed Mobility Overal bed mobility: Needs Assistance Bed Mobility: Supine to Sit     Supine to sit: Min assist, HOB elevated     General bed mobility comments: Min assist to facilitate LLE out of bed, HOB elevated, use of rail. cues for technique.    Transfers Overall transfer level: Needs assistance Equipment used: Rolling walker (2 wheels) Transfers: Sit to/from Stand, Bed to chair/wheelchair/BSC Sit to Stand: Min guard   Step pivot transfers: Min guard       General transfer comment: Close guard for safety. Slow rise from bed with cues for hand placement, maintains TTWB on LLE. Rt knee reported to need replaced, likely causing slow difficult rise. Once upright pt stable with RW for support. Min guard for step pivot to recliner with cues for walker placement.    Ambulation/Gait Ambulation/Gait assistance: Min guard Gait Distance (Feet): 2 Feet Assistive device: Rolling walker (2 wheels) Gait Pattern/deviations: Decreased stride length, Step-to pattern Gait velocity: Decreased     General Gait Details: Took several steps forward maintaining TDWB through LLE. Cues for sequencing. Step pivot towards chair with cues for walker placement. No buckling. Pt declines further.  Stairs            Wheelchair Mobility    Modified Rankin (Stroke Patients Only)  Balance Overall balance assessment: Needs assistance Sitting-balance support: No upper extremity supported, Feet supported Sitting balance-Leahy Scale: Fair     Standing balance support: Bilateral upper extremity  supported, Reliant on assistive device for balance Standing balance-Leahy Scale: Poor                               Pertinent Vitals/Pain Pain Assessment Pain Assessment: Faces Faces Pain Scale: Hurts little more Pain Location: L hip Pain Descriptors / Indicators: Operative site guarding, Sore Pain Intervention(s): Monitored during session, Premedicated before session, Repositioned, Limited activity within patient's tolerance    Home Living Family/patient expects to be discharged to:: Private residence Living Arrangements: Spouse/significant other Available Help at Discharge: Family;Available PRN/intermittently Type of Home: House Home Access: Stairs to enter Entrance Stairs-Rails: Right;Left;Can reach both Entrance Stairs-Number of Steps: 1 Alternate Level Stairs-Number of Steps: flight Home Layout: Two level;Able to live on main level with bedroom/bathroom Home Equipment: Shower seat - built Agricultural consultant (2 wheels);Toilet riser;Rollator (4 wheels)      Prior Function Prior Level of Function : Independent/Modified Independent;Driving;History of Falls (last six months)             Mobility Comments: Was independent with rollator following recent femur fixation surgery.       Hand Dominance   Dominant Hand: Right    Extremity/Trunk Assessment   Upper Extremity Assessment Upper Extremity Assessment: Defer to OT evaluation    Lower Extremity Assessment Lower Extremity Assessment: LLE deficits/detail LLE Deficits / Details: post op swelling and guarding as expected post op LLE: Unable to fully assess due to pain    Cervical / Trunk Assessment Cervical / Trunk Assessment: Normal  Communication   Communication: No difficulties  Cognition Arousal/Alertness: Awake/alert Behavior During Therapy: WFL for tasks assessed/performed Overall Cognitive Status: Within Functional Limits for tasks assessed                                           General Comments      Exercises General Exercises - Lower Extremity Ankle Circles/Pumps: 10 reps, AROM, Both, Seated Quad Sets: 10 reps, Strengthening, Both, Seated   Assessment/Plan    PT Assessment Patient needs continued PT services  PT Problem List Decreased strength;Decreased range of motion;Decreased balance;Decreased activity tolerance;Decreased mobility;Decreased knowledge of use of DME;Decreased safety awareness;Decreased knowledge of precautions;Pain       PT Treatment Interventions DME instruction;Gait training;Stair training;Functional mobility training;Therapeutic activities;Therapeutic exercise;Balance training;Patient/family education;Neuromuscular re-education    PT Goals (Current goals can be found in the Care Plan section)  Acute Rehab PT Goals Patient Stated Goal: Get back home PT Goal Formulation: With patient Time For Goal Achievement: 06/19/22 Potential to Achieve Goals: Good    Frequency Min 5X/week     Co-evaluation               AM-PAC PT "6 Clicks" Mobility  Outcome Measure Help needed turning from your back to your side while in a flat bed without using bedrails?: A Little Help needed moving from lying on your back to sitting on the side of a flat bed without using bedrails?: A Little Help needed moving to and from a bed to a chair (including a wheelchair)?: A Little Help needed standing up from a chair using your arms (e.g., wheelchair or bedside chair)?: A Little Help needed to  walk in hospital room?: A Little Help needed climbing 3-5 steps with a railing? : A Little 6 Click Score: 18    End of Session Equipment Utilized During Treatment: Gait belt Activity Tolerance: Patient tolerated treatment well Patient left: in chair;with call bell/phone within reach;with nursing/sitter in room Nurse Communication: Mobility status;Other (comment) (RN notified need for alarm box) PT Visit Diagnosis: Unsteadiness on feet  (R26.81);Pain;Other abnormalities of gait and mobility (R26.89);Muscle weakness (generalized) (M62.81);History of falling (Z91.81);Difficulty in walking, not elsewhere classified (R26.2) Pain - Right/Left: Left Pain - part of body: Hip    Time: 9842-1031 PT Time Calculation (min) (ACUTE ONLY): 21 min   Charges:   PT Evaluation $PT Eval Low Complexity: 1 Low          Candie Mile, PT, DPT Physical Therapist Acute Rehabilitation Services [place]   Ellouise Newer 06/12/2022, 3:34 PM

## 2022-06-12 NOTE — Anesthesia Procedure Notes (Signed)
Procedure Name: Intubation Date/Time: 06/12/2022 12:25 PM  Performed by: Moshe Salisbury, CRNAPre-anesthesia Checklist: Patient identified, Emergency Drugs available, Suction available and Patient being monitored Patient Re-evaluated:Patient Re-evaluated prior to induction Oxygen Delivery Method: Circle System Utilized Preoxygenation: Pre-oxygenation with 100% oxygen Induction Type: IV induction Ventilation: Mask ventilation without difficulty Laryngoscope Size: Mac and 3 Grade View: Grade I Tube type: Oral Tube size: 7.5 mm Number of attempts: 1 Airway Equipment and Method: Stylet Placement Confirmation: ETT inserted through vocal cords under direct vision, positive ETCO2 and breath sounds checked- equal and bilateral Secured at: 21 cm Tube secured with: Tape Dental Injury: Teeth and Oropharynx as per pre-operative assessment

## 2022-06-12 NOTE — Anesthesia Postprocedure Evaluation (Signed)
Anesthesia Post Note  Patient: Julia Murray  Procedure(s) Performed: EXCHANGE DISTAL FEMUR INTERLOCKING SCREWS (Left: Thigh)     Patient location during evaluation: PACU Anesthesia Type: General Level of consciousness: sedated Pain management: pain level controlled Vital Signs Assessment: post-procedure vital signs reviewed and stable Respiratory status: spontaneous breathing and respiratory function stable Cardiovascular status: stable Postop Assessment: no apparent nausea or vomiting Anesthetic complications: no   No notable events documented.  Last Vitals:  Vitals:   06/12/22 1330 06/12/22 1345  BP: 116/73 131/73  Pulse: 86 87  Resp: 12 16  Temp:  36.7 C  SpO2: 98% 99%    Last Pain:  Vitals:   06/12/22 1345  TempSrc:   PainSc: 0-No pain                 Lew Prout DANIEL

## 2022-06-12 NOTE — Plan of Care (Signed)

## 2022-06-12 NOTE — Anesthesia Preprocedure Evaluation (Addendum)
Anesthesia Evaluation  Patient identified by MRN, date of birth, ID band Patient awake    Reviewed: Allergy & Precautions, NPO status , Patient's Chart, lab work & pertinent test results  History of Anesthesia Complications Negative for: history of anesthetic complications  Airway Mallampati: II  TM Distance: >3 FB Neck ROM: Full    Dental no notable dental hx. (+) Dental Advisory Given   Pulmonary neg pulmonary ROS   Pulmonary exam normal        Cardiovascular negative cardio ROS Normal cardiovascular exam     Neuro/Psych  PSYCHIATRIC DISORDERS Anxiety     negative neurological ROS     GI/Hepatic ,GERD  Medicated and Controlled,,(+)     substance abuse  alcohol use Active nausea   Endo/Other  negative endocrine ROS    Renal/GU negative Renal ROS     Musculoskeletal  (+) Arthritis ,    Abdominal   Peds  Hematology negative hematology ROS (+)   Anesthesia Other Findings   Reproductive/Obstetrics                             Anesthesia Physical Anesthesia Plan  ASA: 3  Anesthesia Plan: General   Post-op Pain Management: Tylenol PO (pre-op)*   Induction: Intravenous  PONV Risk Score and Plan: 3 and Treatment may vary due to age or medical condition, Ondansetron, Dexamethasone and Midazolam  Airway Management Planned: Oral ETT  Additional Equipment: None  Intra-op Plan:   Post-operative Plan: Extubation in OR  Informed Consent: I have reviewed the patients History and Physical, chart, labs and discussed the procedure including the risks, benefits and alternatives for the proposed anesthesia with the patient or authorized representative who has indicated his/her understanding and acceptance.     Dental advisory given  Plan Discussed with: Anesthesiologist and CRNA  Anesthesia Plan Comments:         Anesthesia Quick Evaluation

## 2022-06-12 NOTE — Op Note (Signed)
Orthopaedic Surgery Operative Note (CSN: [id number] ) Date of Surgery: 06/12/2022  Admit Date: 06/11/2022   Diagnoses: Pre-Op Diagnoses: Failed distal interlocking screw fixation  Post-Op Diagnosis: Same  Procedures: CPT 20680-Removal/exchange of left distal interlocking screw   Surgeons : Primary: Maryama Kuriakose, Thomasene Lot, MD  Assistant: None  Location: OR 8   Anesthesia: General   Antibiotics: Ancef 2g preop   Tourniquet time: None    Estimated Blood Loss: Minimal  Complications: None   Specimens:None   Implants: Implant Name Type Inv. Item Serial No. Manufacturer Lot No. LRB No. Used Action  SCREW TRIGEN LOW PROF 5.0X52.5 - [id number] Screw SCREW TRIGEN LOW PROF 5.0X52.5  [employer] [id number] Left 1 Implanted  SCREW TRIGEN LOW PROF 5.0X42.5 - [id number] Screw SCREW TRIGEN LOW PROF 5.0X42.5  [employer] [id number] Left 1 Explanted     Indications for Surgery: 67 year old female who underwent intramedullary nailing of the left subtrochanteric femur fracture on 05/26/2022.  She was doing well until a few days ago where she had a pop and inability to bear weight.  She was brought to the emergency room where x-rays showed loss of fixation of the distal interlocking with no medial cortex fixation.  Due to the loss of fixation distally I felt that she was indicated for exchange interlocking fixation distally.  Risks and benefits were discussed with the patient.  Risks include but not limited to bleeding, infection, recurrence, fracture distally, nonunion, malunion, hardware failure, hardware rotation, nerve or blood vessel injury.  She agreed to proceed with surgery and consent was obtained.  Operative Findings: Removal of previous distal interlocking screw with placement of new distal locking screw in the interlocking hole distal to the previous fixation.  Procedure: The patient was identified in the preoperative holding area. Consent was confirmed with  the patient and their family and all questions were answered. The operative extremity was marked after confirmation with the patient. she was then brought back to the operating room by our anesthesia colleagues.  She was carefully transferred over to radiolucent flattop table.  She was placed under general anesthetic.  The left lower extremity was then prepped and draped in usual sterile fashion.  A timeout was performed to verify the patient, the procedure, and the extremity.  Preoperative antibiotics were dosed.  Fluoroscopic imaging showed the loss of fixation of the distal interlocking screw.  I made incision through the previous surgical scar and I was able to successfully remove the screw without difficulty.  I then used perfect circle technique to place a distal interlocking screw in the oblong hole distal to the previous screw fixation.  I was able to get good bicortical fixation.  There is no motion at the fracture site when I stressed the fracture prior to placing this screw.  I then tried to replace the screw in the previous hole however the lateral cortex was swallowed out and I got no fixation in the screw.  I felt that there would be high likelihood of the screw backing out and as a result I did not leave it and I removed it.  Final fluoroscopic imaging was obtained.  The incision was irrigated.  Was closed with 3-0 Monocryl and Dermabond.  Sterile dressing was applied.  The patient was then awoke from anesthesia and taken to the PACU in stable condition.  Post Op Plan/Instructions: Patient will be touchdown weightbearing to the left lower extremity.  She will receive postoperative Ancef.  She will continue on Eliquis for  DVT prophylaxis.  Will mobilize her with physical and Occupational Therapy.  Possible discharge home postoperative day 1.  I was present and performed the entire surgery.   Katha Hamming, MD [facility]

## 2022-06-12 NOTE — Progress Notes (Signed)
Husband Remo Lipps notified that patient was going for surgery at this time.

## 2022-06-12 NOTE — Transfer of Care (Signed)
Immediate Anesthesia Transfer of Care Note  Patient: Julia Murray  Procedure(s) Performed: EXCHANGE DISTAL FEMUR INTERLOCKING SCREWS (Left: Thigh)  Patient Location: PACU  Anesthesia Type:General  Level of Consciousness: drowsy and patient cooperative  Airway & Oxygen Therapy: Patient Spontanous Breathing  Post-op Assessment: Report given to RN, Post -op Vital signs reviewed and stable, and Patient moving all extremities  Post vital signs: Reviewed and stable  Last Vitals:  Vitals Value Taken Time  BP 127/61 06/12/22 1308  Temp    Pulse 96 06/12/22 1310  Resp 18 06/12/22 1310  SpO2 92 % 06/12/22 1310  Vitals shown include unvalidated device data.  Last Pain:  Vitals:   06/12/22 1205  TempSrc: Oral  PainSc:       Patients Stated Pain Goal: 1 (88/30/14 1597)  Complications: No notable events documented.

## 2022-06-12 NOTE — Interval H&P Note (Signed)
History and Physical Interval Note:  06/12/2022 12:08 PM  Julia Murray  has presented today for surgery, with the diagnosis of Left femur failed fixation.  The various methods of treatment have been discussed with the patient and family. After consideration of risks, benefits and other options for treatment, the patient has consented to  Procedure(s): EXCHANGE DISTAL FEMUR INTERLOCKING SCREWS (Left) as a surgical intervention.  The patient's history has been reviewed, patient examined, no change in status, stable for surgery.  I have reviewed the patient's chart and labs.  Questions were answered to the patient's satisfaction.     Lennette Bihari P Raeley Gilmore

## 2022-06-13 ENCOUNTER — Encounter (HOSPITAL_COMMUNITY): Payer: Self-pay | Admitting: Student

## 2022-06-13 LAB — BASIC METABOLIC PANEL
Anion gap: 7 (ref 5–15)
BUN: 11 mg/dL (ref 8–23)
CO2: 25 mmol/L (ref 22–32)
Calcium: 8.8 mg/dL — ABNORMAL LOW (ref 8.9–10.3)
Chloride: 102 mmol/L (ref 98–111)
Creatinine, Ser: 0.58 mg/dL (ref 0.44–1.00)
GFR, Estimated: >60 mL/min (ref >60)
Glucose, Bld: 117 mg/dL — ABNORMAL HIGH (ref 70–99)
Potassium: 3.9 mmol/L (ref 3.5–5.1)
Sodium: 134 mmol/L — ABNORMAL LOW (ref 135–145)

## 2022-06-13 LAB — CBC
HCT: 29.5 % — ABNORMAL LOW (ref 36.0–46.0)
Hemoglobin: 9.3 g/dL — ABNORMAL LOW (ref 12.0–15.0)
MCH: 31 pg (ref 26.0–34.0)
MCHC: 31.5 g/dL (ref 30.0–36.0)
MCV: 98.3 fL (ref 80.0–100.0)
Platelets: 446 10*3/uL — ABNORMAL HIGH (ref 150–400)
RBC: 3 MIL/uL — ABNORMAL LOW (ref 3.87–5.11)
RDW: 15.8 % — ABNORMAL HIGH (ref 11.5–15.5)
WBC: 7 10*3/uL (ref 4.0–10.5)
nRBC: 0 % (ref 0.0–0.2)

## 2022-06-13 MED ORDER — ONDANSETRON HCL 4 MG PO TABS: 4.0000 mg | ORAL_TABLET | Freq: Four times a day (QID) | ORAL | 0 refills | Status: DC | PRN

## 2022-06-13 MED ORDER — APIXABAN 2.5 MG PO TABS
2.5000 mg | ORAL_TABLET | Freq: Two times a day (BID) | ORAL | Status: DC
  Administered 2022-06-13: 2.5 mg via ORAL
  Filled 2022-06-13: qty 1

## 2022-06-13 MED ORDER — OXYCODONE HCL 5 MG PO TABS: 5.0000 mg | ORAL_TABLET | ORAL | 0 refills | Status: DC | PRN

## 2022-06-13 MED ORDER — METHOCARBAMOL 500 MG PO TABS: 500.0000 mg | ORAL_TABLET | Freq: Four times a day (QID) | ORAL | 0 refills | Status: DC | PRN

## 2022-06-13 NOTE — Progress Notes (Addendum)
ANTICOAGULATION CONSULT NOTE - Initial Consult  Pharmacy Consult for Apixaban Indication: VTE prophylaxis  Allergies  Allergen Reactions   Shellfish Allergy Hives and Swelling   Bee Venom Swelling   Crab Extract Allergy Skin Test Hives and Swelling    Patient Measurements: Height: 5' 4.5" (163.8 cm) Weight: 81 kg (178 lb 9.2 oz) IBW/kg (Calculated) : 55.85   Vital Signs: Temp: 97.9 F (36.6 C) (02/07 0727) Temp Source: Oral (02/07 0727) BP: 97/53 (02/07 0727) Pulse Rate: 77 (02/07 0727)  Labs: Recent Labs    06/11/22 1434 06/13/22 0412  HGB 10.1* 9.3*  HCT 31.9* 29.5*  PLT 470* 446*  CREATININE 0.58 0.58    Estimated Creatinine Clearance: 71 mL/min (by C-G formula based on SCr of 0.58 mg/dL).   Medical History: Past Medical History:  Diagnosis Date   Arthritis    knee, back   COVID-19    2020   GERD (gastroesophageal reflux disease)    Hyperlipidemia     Medications:  Medications Prior to Admission  Medication Sig Dispense Refill Last Dose   apixaban (ELIQUIS) 2.5 MG TABS tablet Take 1 tablet (2.5 mg total) by mouth 2 (two) times daily. 60 tablet 0 06/10/2022 at 1500   atorvastatin (LIPITOR) 20 MG tablet Take 20 mg by mouth daily.   06/11/2022   fluticasone (FLONASE) 50 MCG/ACT nasal spray SPRAY TWO SPRAYS IN EACH NOSTRIL ONCE DAILY (Patient taking differently: Place 2 sprays into both nostrils daily.) 16 mL prn 06/11/2022   HYDROcodone-acetaminophen (NORCO/VICODIN) 5-325 MG tablet Take 1-2 tablets by mouth every 4 (four) hours as needed for severe pain or moderate pain (1 tablet moderate pain, 2 tablets severe pain). (Patient taking differently: Take 1 tablet by mouth every 4 (four) hours as needed for severe pain or moderate pain.) 30 tablet 0 06/11/2022   methocarbamol (ROBAXIN) 500 MG tablet Take 1 tablet (500 mg total) by mouth every 6 (six) hours as needed for muscle spasms. 28 tablet 0 06/11/2022   Multiple Vitamins-Minerals (MULTIVITAMIN WITH MINERALS) tablet  Take 1 tablet by mouth daily.   06/11/2022   omeprazole (PRILOSEC) 20 MG capsule Take 20 mg by mouth daily as needed (heartburn).   06/11/2022   acetaminophen (TYLENOL) 325 MG tablet Take 1-2 tablets (325-650 mg total) by mouth every 6 (six) hours as needed for mild pain (pain score 1-3 or temp > 100.5). (Patient not taking: Reported on 06/11/2022)   Not Taking   valACYclovir (VALTREX) 500 MG tablet TAKE ONE TABLET BY MOUTH ONCE WEEKLY WHICH PREVENTS OUTBREAKS OF HSV (Patient not taking: Reported on 06/11/2022) 12 tablet 3 Not Taking    Assessment: 78 y. Female who was prevoiusly started on Eliquis 2.'5mg'$  po BID for VTE prophylaxis s/p IM nailing L femur on 05/27/22.  Eliquis held for surgery.  Last dose taken 2/4 '@15'$ :00.  Now s/p removal/ exchange of left distal interlocking screw on 2/6.   Pharmacy consult to resume PTA DOAC on POD1 if Hgb >=9  POD #1 06/13/22 the Hgb is 9.3, no bleeding reported.  Pltc stable 400s.  Will resume Apixaban today at  2.'5mg'$  BID for VTE ppx.  Note previously filled Rx on 05/28/22 at [facility] and noted  that next apixaban Rx copay cost is $45.  Goal of Therapy:  VTE prophylaxis    Plan:  Resume the PTA Apixaban 2.5 mg BID for VTE prophylaxis.  Monitor for s/sx of bleeding. Pharmacy will sign off.   Thank you for allowing pharmacy to be part  of this patients care team. Nicole Cella, RPh Clinical Pharmacist 06/13/2022,8:59 AM

## 2022-06-13 NOTE — TOC Transition Note (Signed)
Transition of Care [facility]) - CM/SW Discharge Note   Patient Details  Name: Julia Murray MRN: [id number] Date of Birth: 03/01/1956  [facility] CM/SW Contact:  Sharin Mons, RN Phone Number: 06/13/2022, 1:07 PM   Clinical Narrative:    Patient will DC to: home Anticipated DC date: 06/13/2022 Family notified: yes Transport by: car         - 2/6 s/p EXCHANGE DISTAL FEMUR INTERLOCKING SCREWS LLE Per MD patient ready for DC today . RN, patient, and patient's husband notified of DC. Husband to assist with care once d/c. Pt without DME needs. Order noted for home health services. Pt agreeable. Pt without provider preference. Referral made with [facility] and accepted. Pt without Rx med concerns.  Post hospital f/u noted on AVS. Husband to provide transportation to home.  RNCM will sign off for now as intervention is no longer needed. Please consult [place] again if new needs arise.   Final next level of care: [place] Barriers to Discharge: No Barriers Identified   Patient Goals and CMS Choice      Discharge Placement                         Discharge Plan and Services Additional resources added to the After Visit Summary for                                       Social Determinants of Health (SDOH) Interventions SDOH Screenings   Food Insecurity: No Food Insecurity (06/11/2022)  Housing: Low Risk  (06/11/2022)  Transportation Needs: No Transportation Needs (06/11/2022)  Utilities: Not At Risk (06/11/2022)  Depression (PHQ2-9): Low Risk  (02/28/2021)  Tobacco Use: Low Risk  (06/12/2022)     Readmission Risk Interventions     No data to display

## 2022-06-13 NOTE — Discharge Summary (Signed)
Orthopaedic Trauma Service (OTS) Discharge Summary   Patient ID: Julia Murray MRN: [id number] DOB/AGE: June 27, 1955 67 y.o.  Admit date: 06/11/2022 Discharge date: 06/13/2022  Admission Diagnoses:  Left femur fracture  Failed distal interlocking screw fixation  Discharge Diagnoses:  Principal Problem:   Closed fracture of left femur [facility])   Past Medical History:  Diagnosis Date   Arthritis    knee, back   COVID-19    2020   GERD (gastroesophageal reflux disease)    Hyperlipidemia      Procedures Performed: CPT 20680-Removal/exchange of left distal interlocking screw   Discharged Condition: good  Hospital Course: Patient presented to [facility] ED on 06/11/22 for left leg pain.  Was found to have hardware failure of the distal interlock screw from previous IM nail on 05/27/2022.  Patient admitted to orthopedic service for surgical intervention.  Patient taken to the operating room by Dr. Doreatha Martin on 06/12/2022 for the above procedure.  She tolerated this well without complications.  Was instructed be touchdown weightbearing postoperatively.  Began working physical and Occupational Therapy starting on postoperative day #1.  Restarted her Eliquis for DVT prophylaxis starting on postoperative day #1. On 06/13/2022, the patient was tolerating diet, working well with therapies, pain well controlled, vital signs stable, dressings clean, dry, intact and felt stable for discharge to home. Patient will follow up as below and knows to call with questions or concerns.     Consults: None  Significant Diagnostic Studies:   Results for orders placed or performed during the hospital encounter of 06/11/22 (from the past 168 hour(s))  CBC   Collection Time: 06/11/22  2:34 PM  Result Value Ref Range   WBC 5.7 4.0 - 10.5 K/uL   RBC 3.25 (L) 3.87 - 5.11 MIL/uL   Hemoglobin 10.1 (L) 12.0 - 15.0 g/dL   HCT 31.9 (L) 36.0 - 46.0 %   MCV 98.2 80.0 - 100.0 fL   MCH 31.1 26.0 - 34.0 pg   MCHC 31.7 30.0 -  36.0 g/dL   RDW 16.1 (H) 11.5 - 15.5 %   Platelets 470 (H) 150 - 400 K/uL   nRBC 0.0 0.0 - 0.2 %  Basic metabolic panel   Collection Time: 06/11/22  2:34 PM  Result Value Ref Range   Sodium 139 135 - 145 mmol/L   Potassium 4.0 3.5 - 5.1 mmol/L   Chloride 105 98 - 111 mmol/L   CO2 23 22 - 32 mmol/L   Glucose, Bld 91 70 - 99 mg/dL   BUN 16 8 - 23 mg/dL   Creatinine, Ser 0.58 0.44 - 1.00 mg/dL   Calcium 9.1 8.9 - 10.3 mg/dL   GFR, Estimated >60 >60 mL/min   Anion gap 11 5 - 15  CBC   Collection Time: 06/13/22  4:12 AM  Result Value Ref Range   WBC 7.0 4.0 - 10.5 K/uL   RBC 3.00 (L) 3.87 - 5.11 MIL/uL   Hemoglobin 9.3 (L) 12.0 - 15.0 g/dL   HCT 29.5 (L) 36.0 - 46.0 %   MCV 98.3 80.0 - 100.0 fL   MCH 31.0 26.0 - 34.0 pg   MCHC 31.5 30.0 - 36.0 g/dL   RDW 15.8 (H) 11.5 - 15.5 %   Platelets 446 (H) 150 - 400 K/uL   nRBC 0.0 0.0 - 0.2 %  Basic metabolic panel   Collection Time: 06/13/22  4:12 AM  Result Value Ref Range   Sodium 134 (L) 135 - 145 mmol/L   Potassium  3.9 3.5 - 5.1 mmol/L   Chloride 102 98 - 111 mmol/L   CO2 25 22 - 32 mmol/L   Glucose, Bld 117 (H) 70 - 99 mg/dL   BUN 11 8 - 23 mg/dL   Creatinine, Ser 0.58 0.44 - 1.00 mg/dL   Calcium 8.8 (L) 8.9 - 10.3 mg/dL   GFR, Estimated >60 >60 mL/min   Anion gap 7 5 - 15     Treatments: IV hydration, antibiotics: Ancef, analgesia: acetaminophen, Morphine, and oxycodone, anticoagulation: Eliquis, therapies: PT and OT, and surgery: as above  Discharge Exam: General - NAD LLE: Dressing CDI. Tolerated gentle knee/ankle ROM. Sensation intact. +DP pulse  Disposition: Discharge disposition: 01-Home or Self Care       Discharge Instructions     Call MD / Call 911   Complete by: As directed    If you experience chest pain or shortness of breath, CALL 911 and be transported to the hospital emergency room.  If you develope a fever above 101 F, pus (white drainage) or increased drainage or redness at the wound, or calf  pain, call your surgeon's office.   Constipation Prevention   Complete by: As directed    Drink plenty of fluids.  Prune juice may be helpful.  You may use a stool softener, such as Colace (over the counter) 100 mg twice a day.  Use MiraLax (over the counter) for constipation as needed.   Diet - low sodium heart healthy   Complete by: As directed    Increase activity slowly as tolerated   Complete by: As directed    Post-operative opioid taper instructions:   Complete by: As directed    POST-OPERATIVE OPIOID TAPER INSTRUCTIONS: It is important to wean off of your opioid medication as soon as possible. If you do not need pain medication after your surgery it is ok to stop day one. Opioids include: Codeine, Hydrocodone(Norco, Vicodin), Oxycodone(Percocet, oxycontin) and hydromorphone amongst others.  Long term and even short term use of opiods can cause: Increased pain response Dependence Constipation Depression Respiratory depression And more.  Withdrawal symptoms can include Flu like symptoms Nausea, vomiting And more Techniques to manage these symptoms Hydrate well Eat regular healthy meals Stay active Use relaxation techniques(deep breathing, meditating, yoga) Do Not substitute Alcohol to help with tapering If you have been on opioids for less than two weeks and do not have pain than it is ok to stop all together.  Plan to wean off of opioids This plan should start within one week post op of your joint replacement. Maintain the same interval or time between taking each dose and first decrease the dose.  Cut the total daily intake of opioids by one tablet each day Next start to increase the time between doses. The last dose that should be eliminated is the evening dose.         Allergies as of 06/13/2022       Reactions   Shellfish Allergy Hives, Swelling   Bee Venom Swelling   Crab Extract Allergy Skin Test Hives, Swelling        Medication List     STOP taking  these medications    HYDROcodone-acetaminophen 5-325 MG tablet Commonly known as: NORCO/VICODIN       TAKE these medications    acetaminophen 325 MG tablet Commonly known as: TYLENOL Take 1-2 tablets (325-650 mg total) by mouth every 6 (six) hours as needed for mild pain (pain score 1-3 or temp > 100.5).  atorvastatin 20 MG tablet Commonly known as: LIPITOR Take 20 mg by mouth daily.   Eliquis 2.5 MG Tabs tablet Generic drug: apixaban Take 1 tablet (2.5 mg total) by mouth 2 (two) times daily.   fluticasone 50 MCG/ACT nasal spray Commonly known as: FLONASE SPRAY TWO SPRAYS IN EACH NOSTRIL ONCE DAILY What changed: See the new instructions.   methocarbamol 500 MG tablet Commonly known as: ROBAXIN Take 1 tablet (500 mg total) by mouth every 6 (six) hours as needed for muscle spasms.   multivitamin with minerals tablet Take 1 tablet by mouth daily.   omeprazole 20 MG capsule Commonly known as: PRILOSEC Take 20 mg by mouth daily as needed (heartburn).   ondansetron 4 MG tablet Commonly known as: ZOFRAN Take 1 tablet (4 mg total) by mouth every 6 (six) hours as needed for nausea.   oxyCODONE 5 MG immediate release tablet Commonly known as: Oxy IR/ROXICODONE Take 1 tablet (5 mg total) by mouth every 4 (four) hours as needed for severe pain.   valACYclovir 500 MG tablet Commonly known as: VALTREX TAKE ONE TABLET BY MOUTH ONCE WEEKLY WHICH PREVENTS OUTBREAKS OF HSV               Durable Medical Equipment  (From admission, onward)           Start     Ordered   06/12/22 1532  For home use only DME Walker  Once       Question Answer Comment  Patient needs a walker to treat with the following condition Difficulty walking   Patient needs a walker to treat with the following condition Other abnormalities of gait and mobility      06/12/22 1531            Follow-up Information     Haddix, Thomasene Lot, MD. Go on 06/26/2022.   Specialty: Orthopedic  Surgery Why: Appointment 06/26/22 at [facility] for wound check and repeat x-rays Contact information: [place] [address] [phone]                 Discharge Instructions and Plan: Patient will be discharged to home. Will continue touchdown weightbearing on the left lower extremity. Will be discharged on  Eliquis  for DVT prophylaxis. Patient has been provided with all the necessary DME for discharge. Patient will follow up with Dr. Doreatha Martin in 2 weeks for repeat x-rays and wound check. Will be set up with [facility] PT. We will have patient hold off on restarting outpatient physical therapy until seen in follow-up.   Signed:  Gwinda Passe, PA-C ?[phone]? (phone) 06/13/2022, 11:10 AM  [facility] [place] [phone] (F)

## 2022-06-13 NOTE — Evaluation (Signed)
Occupational Therapy Evaluation and Discharge Patient Details Name: Julia Murray MRN: [id number] DOB: 20-Nov-1955 Today's Date: 06/13/2022   History of Present Illness 67 y/o female presented 2/5 for LLE pain, on 2/6 s/p EXCHANGE DISTAL FEMUR INTERLOCKING SCREWS LLE. Recent cephalomedullary nailing on 1/21 by Dr. Doreatha Martin after sustaining a femur fracture to same leg. PMH Lt ACL repair, L TKA, Rt knee scope 2013.   Clinical Impression   Pt has been modified independent and using a RW since surgery in January. Pt was WBAT then and is now TDWB on L LE. Pt needing cues to adhere to WB precautions with ambulation. Educated in shower transfer and ADLs heeding WB status. Pt verbalized and/or demonstrated understanding. Pt has support of her family for IADLs while she is recovering. No further OT needs.      Recommendations for follow up therapy are one component of a multi-disciplinary discharge planning process, led by the attending physician.  Recommendations may be updated based on patient status, additional functional criteria and insurance authorization.   Follow Up Recommendations  No OT follow up     Assistance Recommended at Discharge Intermittent Supervision/Assistance  Patient can return home with the following Assist for transportation;Assistance with cooking/housework    Functional Status Assessment  Patient has had a recent decline in their functional status and demonstrates the ability to make significant improvements in function in a reasonable and predictable amount of time.  Equipment Recommendations  None recommended by OT    Recommendations for Other Services       Precautions / Restrictions Precautions Precautions: Fall Restrictions Weight Bearing Restrictions: Yes RLE Weight Bearing: Weight bearing as tolerated LLE Weight Bearing: Touchdown weight bearing      Mobility Bed Mobility               General bed mobility comments: received on toilet     Transfers Overall transfer level: Needs assistance Equipment used: Rolling walker (2 wheels) Transfers: Sit to/from Stand Sit to Stand: Supervision           General transfer comment: cues for TDWB      Balance Overall balance assessment: Needs assistance   Sitting balance-Leahy Scale: Good     Standing balance support: Bilateral upper extremity supported, Reliant on assistive device for balance Standing balance-Leahy Scale: Poor                             ADL either performed or assessed with clinical judgement   ADL Overall ADL's : Needs assistance/impaired Eating/Feeding: Independent;Sitting   Grooming: Supervision/safety;Standing;Wash/dry hands   Upper Body Bathing: Set up;Sitting   Lower Body Bathing: Supervison/ safety;Sitting/lateral leans;Sit to/from stand   Upper Body Dressing : Set up;Sitting   Lower Body Dressing: Supervision/safety;Sitting/lateral leans;Sit to/from stand   Toilet Transfer: Supervision/safety;Ambulation;Rolling walker (2 wheels)   Toileting- Clothing Manipulation and Hygiene: Supervision/safety;Sit to/from stand       Functional mobility during ADLs: Supervision/safety;Rolling walker (2 wheels)       Vision Ability to See in Adequate Light: 0 Adequate Patient Visual Report: No change from baseline       Perception     Praxis      Pertinent Vitals/Pain Pain Assessment Pain Assessment: Faces Faces Pain Scale: Hurts little more Pain Location: L knee Pain Descriptors / Indicators: Discomfort, Sore Pain Intervention(s): Ice applied, Monitored during session, Repositioned     Hand Dominance Right   Extremity/Trunk Assessment Upper Extremity Assessment Upper Extremity Assessment:  Overall [facility] for tasks assessed   Lower Extremity Assessment Lower Extremity Assessment: Defer to PT evaluation   Cervical / Trunk Assessment Cervical / Trunk Assessment: Normal   Communication Communication Communication: No  difficulties   Cognition Arousal/Alertness: Awake/alert Behavior During Therapy: WFL for tasks assessed/performed Overall Cognitive Status: Within Functional Limits for tasks assessed                                       General Comments       Exercises     Shoulder Instructions      Home Living Family/patient expects to be discharged to:: Private residence Living Arrangements: Spouse/significant other Available Help at Discharge: Family;Available PRN/intermittently Type of Home: House Home Access: Stairs to enter [employer] of Steps: 1 Entrance Stairs-Rails: Right;Left;Can reach both Home Layout: Two level;Able to live on main level with bedroom/bathroom Alternate Level Stairs-Number of Steps: flight   Bathroom Shower/Tub: Walk-in shower;Curtain   Bathroom Toilet: Standard     Home Equipment: Shower seat - built Agricultural consultant (2 wheels);Toilet riser;Rollator (4 wheels);Shower seat;Hand held shower head          Prior Functioning/Environment Prior Level of Function : Independent/Modified Independent;Driving;History of Falls (last six months)             Mobility Comments: Was independent with rollator following recent femur fixation surgery. ADLs Comments: sitting to shower, modified independent        OT Problem List:        OT Treatment/Interventions:      OT Goals(Current goals can be found in the care plan section)    OT Frequency:      Co-evaluation              AM-PAC OT "6 Clicks" Daily Activity     Outcome Measure Help from another person eating meals?: None Help from another person taking care of personal grooming?: A Little Help from another person toileting, which includes using toliet, bedpan, or urinal?: A Little Help from another person bathing (including washing, rinsing, drying)?: None Help from another person to put on and taking off regular upper body clothing?: A Little   6 Click  Score: 17   End of Session Equipment Utilized During Treatment: Rolling walker (2 wheels);Gait belt  Activity Tolerance: Patient tolerated treatment well Patient left: in chair;with call bell/phone within reach  OT Visit Diagnosis: Pain                Time: 1001-1020 OT Time Calculation (min): 19 min Charges:  OT General Charges $OT Visit: 1 Visit OT Evaluation $OT Eval Low Complexity: [place], OTR/L Acute Rehabilitation Services Office: [phone]   Malka So 06/13/2022, 10:20 AM

## 2022-06-13 NOTE — Progress Notes (Signed)
Physical Therapy Treatment Patient Details Name: Julia Murray MRN: [id number] DOB: Nov 24, 1955 Today's Date: 06/13/2022   History of Present Illness 67 y/o female presented 2/5 for LLE pain, on 2/6 s/p EXCHANGE DISTAL FEMUR INTERLOCKING SCREWS LLE. Recent cephalomedullary nailing on 1/21 by Dr. Doreatha Martin after sustaining a femur fracture to same leg. PMH Lt ACL repair, L TKA, Rt knee scope 2013.    PT Comments    Pt was seen for progressing gait in room and to practice stair task for entrance to her home.  Pt is demonstrating difficulty with maintaining WB without reminders, but with them is safe and able to walk up steps and in room without overdoing wb.  Follow along with her for practice on stairs, and to increase walking distances as her stay permits.   Follow for the goals on POC in PT note.     Recommendations for follow up therapy are one component of a multi-disciplinary discharge planning process, led by the attending physician.  Recommendations may be updated based on patient status, additional functional criteria and insurance authorization.  Follow Up Recommendations  Outpatient PT     Assistance Recommended at Discharge Intermittent Supervision/Assistance  Patient can return home with the following A little help with walking and/or transfers;A little help with bathing/dressing/bathroom;Assistance with cooking/housework;Assist for transportation;Help with stairs or ramp for entrance   Equipment Recommendations  Rolling walker (2 wheels)    Recommendations for Other Services       Precautions / Restrictions Precautions Precautions: Fall Restrictions Weight Bearing Restrictions: Yes RLE Weight Bearing: Weight bearing as tolerated LLE Weight Bearing: Touchdown weight bearing     Mobility  Bed Mobility               General bed mobility comments: in chair    Transfers Overall transfer level: Needs assistance Equipment used: Rolling walker (2 wheels), 1 person  hand held assist Transfers: Sit to/from Stand Sit to Stand: Min guard           General transfer comment: needed mult reminders to maintain TDWB to NWB on LLE    Ambulation/Gait Ambulation/Gait assistance: Min guard Gait Distance (Feet): 35 Feet Assistive device: Rolling walker (2 wheels) Gait Pattern/deviations: Step-to pattern, Decreased stride length, Decreased weight shift to right Gait velocity: Decreased Gait velocity interpretation: <1.31 ft/sec, indicative of household ambulator Pre-gait activities: standing balance and WB ck General Gait Details: short walk in the room to get to bathroom and maintain the TDWB to NWB on LLE   Stairs Stairs: Yes Stairs assistance: Min guard, Min assist Stair Management: Forwards, With walker, Step to pattern Number of Stairs: 4 General stair comments: managed steps on platform, with pt initially trying to use forward propulsion with walker only partially on step.  Explained to pt how that would be dangerous and she ended up hopping up any way.  Redid at her willingness to reinstruct the task   Wheelchair Mobility    Modified Rankin (Stroke Patients Only)       Balance Overall balance assessment: Needs assistance Sitting-balance support: Feet supported Sitting balance-Leahy Scale: Good     Standing balance support: Bilateral upper extremity supported, During functional activity Standing balance-Leahy Scale: Poor                              Cognition Arousal/Alertness: Awake/alert Behavior During Therapy: WFL for tasks assessed/performed, Impulsive Overall Cognitive Status: Within Functional Limits for tasks assessed  Exercises      General Comments General comments (skin integrity, edema, etc.): pt was reinstructed on her misperception of the stair task, finally was successful in performing correctly      Pertinent Vitals/Pain Pain  Assessment Pain Assessment: Faces Faces Pain Scale: Hurts little more Pain Location: L knee Pain Descriptors / Indicators: Discomfort, Sore Pain Intervention(s): Limited activity within patient's tolerance, Monitored during session, Premedicated before session, Repositioned (also having some pain on R knee from OA)    Home Living                          Prior Function            PT Goals (current goals can now be found in the care plan section) Acute Rehab PT Goals Patient Stated Goal: Get back home Progress towards PT goals: Progressing toward goals    Frequency    Min 5X/week      PT Plan Current plan remains appropriate    Co-evaluation              AM-PAC PT "6 Clicks" Mobility   Outcome Measure  Help needed turning from your back to your side while in a flat bed without using bedrails?: A Little Help needed moving from lying on your back to sitting on the side of a flat bed without using bedrails?: A Little Help needed moving to and from a bed to a chair (including a wheelchair)?: A Little Help needed standing up from a chair using your arms (e.g., wheelchair or bedside chair)?: A Little Help needed to walk in hospital room?: A Little Help needed climbing 3-5 steps with a railing? : A Little 6 Click Score: 18    End of Session Equipment Utilized During Treatment: Gait belt Activity Tolerance: Patient tolerated treatment well Patient left: in chair;with call bell/phone within reach;with nursing/sitter in room Nurse Communication: Mobility status;Other (comment) PT Visit Diagnosis: Unsteadiness on feet (R26.81);Pain;Other abnormalities of gait and mobility (R26.89);Muscle weakness (generalized) (M62.81);History of falling (Z91.81);Difficulty in walking, not elsewhere classified (R26.2) Pain - Right/Left: Left Pain - part of body: Hip     Time: 1215-1232 PT Time Calculation (min) (ACUTE ONLY): 17 min  Charges:  $Gait Training: 8-22  mins   Ramond Dial 06/13/2022, 4:18 PM  Mee Hives, PT PhD Acute Rehab Dept. Number: [place]

## 2022-06-13 NOTE — Discharge Instructions (Addendum)
[facility] Discharge Instructions   General Discharge Instructions  WEIGHT BEARING STATUS:Touchdown weightbearing left lower extremity  RANGE OF MOTION/ACTIVITY: Ok for gentle hip and knee range of motion as tolerated. Avoid returning to outpatient physcial therapy until seen back in follow-up by Dr. Doreatha Martin  Wound Care: You may remove your surgical dressing on post-op day #2, (Thursday 06/14/22). Incisions can be left open to air if there is no drainage. Once the incision is completely dry and without drainage, it may be left open to air out.  Showering may begin on postop day #3, (Friday 06/15/22).  Clean incision gently with soap and water.  DVT/PE prophylaxis:  Continue Eliquis twice daily  Diet: as you were eating previously.  Can use over the counter stool softeners and bowel preparations, such as Miralax, to help with bowel movements.  Narcotics can be constipating.  Be sure to drink plenty of fluids  PAIN MEDICATION USE AND EXPECTATIONS  You have likely been given narcotic medications to help control your pain.  After a traumatic event that results in an fracture (broken bone) with or without surgery, it is ok to use narcotic pain medications to help control one's pain.  We understand that everyone responds to pain differently and each individual patient will be evaluated on a regular basis for the continued need for narcotic medications. Ideally, narcotic medication use should last no more than 6-8 weeks (coinciding with fracture healing).   As a patient it is your responsibility as well to monitor narcotic medication use and report the amount and frequency you use these medications when you come to your office visit.   We would also advise that if you are using narcotic medications, you should take a dose prior to therapy to maximize you participation.  IF YOU ARE ON NARCOTIC MEDICATIONS IT IS NOT PERMISSIBLE TO OPERATE A MOTOR VEHICLE (MOTORCYCLE/CAR/TRUCK/MOPED) OR  HEAVY MACHINERY DO NOT MIX NARCOTICS WITH OTHER CNS (CENTRAL NERVOUS SYSTEM) DEPRESSANTS SUCH AS ALCOHOL   STOP SMOKING OR USING NICOTINE PRODUCTS!!!!  As discussed nicotine severely impairs your body's ability to heal surgical and traumatic wounds but also impairs bone healing.  Wounds and bone heal by forming microscopic blood vessels (angiogenesis) and nicotine is a vasoconstrictor (essentially, shrinks blood vessels).  Therefore, if vasoconstriction occurs to these microscopic blood vessels they essentially disappear and are unable to deliver necessary nutrients to the healing tissue.  This is one modifiable factor that you can do to dramatically increase your chances of healing your injury.    (This means no smoking, no nicotine gum, patches, etc)  DO NOT USE NONSTEROIDAL ANTI-INFLAMMATORY DRUGS (NSAID'S)  Using products such as Advil (ibuprofen), Aleve (naproxen), Motrin (ibuprofen) for additional pain control during fracture healing can delay and/or prevent the healing response.  If you would like to take over the counter (OTC) medication, Tylenol (acetaminophen) is ok.  However, some narcotic medications that are given for pain control contain acetaminophen as well. Therefore, you should not exceed more than 4000 mg of tylenol in a day if you do not have liver disease.  Also note that there are may OTC medicines, such as cold medicines and allergy medicines that my contain tylenol as well.  If you have any questions about medications and/or interactions please ask your doctor/PA or your pharmacist.      ICE AND ELEVATE INJURED/OPERATIVE EXTREMITY  Using ice and elevating the injured extremity above your heart can help with swelling and pain control.  Icing in a pulsatile  fashion, such as 20 minutes on and 20 minutes off, can be followed.    Do not place ice directly on skin. Make sure there is a barrier between to skin and the ice pack.    Using frozen items such as frozen peas works well as  the conform nicely to the are that needs to be iced.  USE AN ACE WRAP OR TED HOSE FOR SWELLING CONTROL  In addition to icing and elevation, Ace wraps or TED hose are used to help limit and resolve swelling.  It is recommended to use Ace wraps or TED hose until you are informed to stop.    When using Ace Wraps start the wrapping distally (farthest away from the body) and wrap proximally (closer to the body)   Example: If you had surgery on your leg or thing and you do not have a splint on, start the ace wrap at the toes and work your way up to the thigh        If you had surgery on your upper extremity and do not have a splint on, start the ace wrap at your fingers and work your way up to the upper arm  [place] REFILLS OR WITH ANY QUESTIONS/CONCERNS: [phone]   VISIT OUR WEBSITE FOR ADDITIONAL INFORMATION: [email or web address]    Discharge Wound Care Instructions  Do NOT apply any ointments, solutions or lotions to pin sites or surgical wounds.  These prevent needed drainage and even though solutions like hydrogen peroxide kill bacteria, they also damage cells lining the pin sites that help fight infection.  Applying lotions or ointments can keep the wounds moist and can cause them to breakdown and open up as well. This can increase the risk for infection. When in doubt call the office.   If any drainage is noted, use a foam dressing (Mepilex) - These dressing supplies should be available at local medical supply stores [facility], [facility], etc) as well as Management consultant ([facility], [facility], [employer], etc)  Once the incision is completely dry and without drainage, it may be left open to air out.  Showering may begin 36-48 hours later.  Cleaning gently with soap and water.    Information on my medicine - ELIQUIS (apixaban)  This medication education was reviewed with me or my healthcare representative as part of my discharge preparation.  Why was Eliquis  prescribed for you? Eliquis was prescribed for you to reduce the risk of blood clots forming after orthopedic surgery.    What do You need to know about Eliquis? Take your Eliquis TWICE DAILY - one tablet in the morning and one tablet in the evening with or without food.  It would be best to take the dose about the same time each day.  If you have difficulty swallowing the tablet whole please discuss with your pharmacist how to take the medication safely.  Take Eliquis exactly as prescribed by your doctor and DO NOT stop taking Eliquis without talking to the doctor who prescribed the medication.  Stopping without other medication to take the place of Eliquis may increase your risk of developing a clot.  After discharge, you should have regular check-up appointments with your healthcare provider that is prescribing your Eliquis.  What do you do if you miss a dose? If a dose of ELIQUIS is not taken at the scheduled time, take it as soon as possible on the same day and twice-daily administration should be resumed.  The dose should  not be doubled to make up for a missed dose.  Do not take more than one tablet of ELIQUIS at the same time.  Important Safety Information A possible side effect of Eliquis is bleeding. You should call your healthcare provider right away if you experience any of the following: Bleeding from an injury or your nose that does not stop. Unusual colored urine (red or dark brown) or unusual colored stools (red or black). Unusual bruising for unknown reasons. A serious fall or if you hit your head (even if there is no bleeding).  Some medicines may interact with Eliquis and might increase your risk of bleeding or clotting while on Eliquis. To help avoid this, consult your healthcare provider or pharmacist prior to using any new prescription or non-prescription medications, including herbals, vitamins, non-steroidal anti-inflammatory drugs (NSAIDs) and  supplements.  This website has more information on Eliquis (apixaban): http://www.eliquis.com/eliquis/home

## 2022-06-19 DIAGNOSIS — I959 Hypotension, unspecified: Secondary | ICD-10-CM | POA: Diagnosis not present

## 2022-06-19 DIAGNOSIS — Z7951 Long term (current) use of inhaled steroids: Secondary | ICD-10-CM | POA: Diagnosis not present

## 2022-06-19 DIAGNOSIS — J309 Allergic rhinitis, unspecified: Secondary | ICD-10-CM | POA: Diagnosis not present

## 2022-06-19 DIAGNOSIS — S72142D Displaced intertrochanteric fracture of left femur, subsequent encounter for closed fracture with routine healing: Secondary | ICD-10-CM | POA: Diagnosis not present

## 2022-06-19 DIAGNOSIS — K219 Gastro-esophageal reflux disease without esophagitis: Secondary | ICD-10-CM | POA: Diagnosis not present

## 2022-06-19 DIAGNOSIS — Z9181 History of falling: Secondary | ICD-10-CM | POA: Diagnosis not present

## 2022-06-19 DIAGNOSIS — T84195D Other mechanical complication of internal fixation device of left femur, subsequent encounter: Secondary | ICD-10-CM | POA: Diagnosis not present

## 2022-06-19 DIAGNOSIS — F419 Anxiety disorder, unspecified: Secondary | ICD-10-CM | POA: Diagnosis not present

## 2022-06-19 DIAGNOSIS — Z96652 Presence of left artificial knee joint: Secondary | ICD-10-CM | POA: Diagnosis not present

## 2022-06-19 DIAGNOSIS — Z8616 Personal history of COVID-19: Secondary | ICD-10-CM | POA: Diagnosis not present

## 2022-06-19 DIAGNOSIS — E782 Mixed hyperlipidemia: Secondary | ICD-10-CM | POA: Diagnosis not present

## 2022-06-19 DIAGNOSIS — Z7901 Long term (current) use of anticoagulants: Secondary | ICD-10-CM | POA: Diagnosis not present

## 2022-06-19 DIAGNOSIS — M1711 Unilateral primary osteoarthritis, right knee: Secondary | ICD-10-CM | POA: Diagnosis not present

## 2022-06-21 ENCOUNTER — Encounter: Payer: Self-pay | Admitting: Student

## 2022-06-22 DIAGNOSIS — K219 Gastro-esophageal reflux disease without esophagitis: Secondary | ICD-10-CM | POA: Diagnosis not present

## 2022-06-22 DIAGNOSIS — M1711 Unilateral primary osteoarthritis, right knee: Secondary | ICD-10-CM | POA: Diagnosis not present

## 2022-06-22 DIAGNOSIS — Z96652 Presence of left artificial knee joint: Secondary | ICD-10-CM | POA: Diagnosis not present

## 2022-06-22 DIAGNOSIS — Z7901 Long term (current) use of anticoagulants: Secondary | ICD-10-CM | POA: Diagnosis not present

## 2022-06-22 DIAGNOSIS — F419 Anxiety disorder, unspecified: Secondary | ICD-10-CM | POA: Diagnosis not present

## 2022-06-22 DIAGNOSIS — E782 Mixed hyperlipidemia: Secondary | ICD-10-CM | POA: Diagnosis not present

## 2022-06-22 DIAGNOSIS — Z8616 Personal history of COVID-19: Secondary | ICD-10-CM | POA: Diagnosis not present

## 2022-06-22 DIAGNOSIS — I959 Hypotension, unspecified: Secondary | ICD-10-CM | POA: Diagnosis not present

## 2022-06-22 DIAGNOSIS — S72142D Displaced intertrochanteric fracture of left femur, subsequent encounter for closed fracture with routine healing: Secondary | ICD-10-CM | POA: Diagnosis not present

## 2022-06-22 DIAGNOSIS — Z7951 Long term (current) use of inhaled steroids: Secondary | ICD-10-CM | POA: Diagnosis not present

## 2022-06-22 DIAGNOSIS — J309 Allergic rhinitis, unspecified: Secondary | ICD-10-CM | POA: Diagnosis not present

## 2022-06-22 DIAGNOSIS — Z9181 History of falling: Secondary | ICD-10-CM | POA: Diagnosis not present

## 2022-06-22 DIAGNOSIS — T84195D Other mechanical complication of internal fixation device of left femur, subsequent encounter: Secondary | ICD-10-CM | POA: Diagnosis not present

## 2022-06-25 DIAGNOSIS — M1711 Unilateral primary osteoarthritis, right knee: Secondary | ICD-10-CM | POA: Diagnosis not present

## 2022-06-25 DIAGNOSIS — T84195D Other mechanical complication of internal fixation device of left femur, subsequent encounter: Secondary | ICD-10-CM | POA: Diagnosis not present

## 2022-06-25 DIAGNOSIS — I959 Hypotension, unspecified: Secondary | ICD-10-CM | POA: Diagnosis not present

## 2022-06-25 DIAGNOSIS — Z9181 History of falling: Secondary | ICD-10-CM | POA: Diagnosis not present

## 2022-06-25 DIAGNOSIS — K219 Gastro-esophageal reflux disease without esophagitis: Secondary | ICD-10-CM | POA: Diagnosis not present

## 2022-06-25 DIAGNOSIS — S72142D Displaced intertrochanteric fracture of left femur, subsequent encounter for closed fracture with routine healing: Secondary | ICD-10-CM | POA: Diagnosis not present

## 2022-06-25 DIAGNOSIS — Z8616 Personal history of COVID-19: Secondary | ICD-10-CM | POA: Diagnosis not present

## 2022-06-25 DIAGNOSIS — J309 Allergic rhinitis, unspecified: Secondary | ICD-10-CM | POA: Diagnosis not present

## 2022-06-25 DIAGNOSIS — F419 Anxiety disorder, unspecified: Secondary | ICD-10-CM | POA: Diagnosis not present

## 2022-06-25 DIAGNOSIS — Z7901 Long term (current) use of anticoagulants: Secondary | ICD-10-CM | POA: Diagnosis not present

## 2022-06-25 DIAGNOSIS — Z96652 Presence of left artificial knee joint: Secondary | ICD-10-CM | POA: Diagnosis not present

## 2022-06-25 DIAGNOSIS — Z7951 Long term (current) use of inhaled steroids: Secondary | ICD-10-CM | POA: Diagnosis not present

## 2022-06-25 DIAGNOSIS — E782 Mixed hyperlipidemia: Secondary | ICD-10-CM | POA: Diagnosis not present

## 2022-06-26 DIAGNOSIS — S7222XD Displaced subtrochanteric fracture of left femur, subsequent encounter for closed fracture with routine healing: Secondary | ICD-10-CM | POA: Diagnosis not present

## 2022-07-06 DIAGNOSIS — F419 Anxiety disorder, unspecified: Secondary | ICD-10-CM | POA: Diagnosis not present

## 2022-07-06 DIAGNOSIS — Z7951 Long term (current) use of inhaled steroids: Secondary | ICD-10-CM | POA: Diagnosis not present

## 2022-07-06 DIAGNOSIS — Z9181 History of falling: Secondary | ICD-10-CM | POA: Diagnosis not present

## 2022-07-06 DIAGNOSIS — Z8616 Personal history of COVID-19: Secondary | ICD-10-CM | POA: Diagnosis not present

## 2022-07-06 DIAGNOSIS — Z7901 Long term (current) use of anticoagulants: Secondary | ICD-10-CM | POA: Diagnosis not present

## 2022-07-06 DIAGNOSIS — I959 Hypotension, unspecified: Secondary | ICD-10-CM | POA: Diagnosis not present

## 2022-07-06 DIAGNOSIS — J309 Allergic rhinitis, unspecified: Secondary | ICD-10-CM | POA: Diagnosis not present

## 2022-07-06 DIAGNOSIS — M1711 Unilateral primary osteoarthritis, right knee: Secondary | ICD-10-CM | POA: Diagnosis not present

## 2022-07-06 DIAGNOSIS — E782 Mixed hyperlipidemia: Secondary | ICD-10-CM | POA: Diagnosis not present

## 2022-07-06 DIAGNOSIS — S72142D Displaced intertrochanteric fracture of left femur, subsequent encounter for closed fracture with routine healing: Secondary | ICD-10-CM | POA: Diagnosis not present

## 2022-07-06 DIAGNOSIS — Z96652 Presence of left artificial knee joint: Secondary | ICD-10-CM | POA: Diagnosis not present

## 2022-07-06 DIAGNOSIS — T84195D Other mechanical complication of internal fixation device of left femur, subsequent encounter: Secondary | ICD-10-CM | POA: Diagnosis not present

## 2022-07-06 DIAGNOSIS — K219 Gastro-esophageal reflux disease without esophagitis: Secondary | ICD-10-CM | POA: Diagnosis not present

## 2022-07-09 ENCOUNTER — Other Ambulatory Visit: Payer: Self-pay

## 2022-07-09 ENCOUNTER — Emergency Department (HOSPITAL_BASED_OUTPATIENT_CLINIC_OR_DEPARTMENT_OTHER): Payer: Medicare Other

## 2022-07-09 ENCOUNTER — Emergency Department (HOSPITAL_BASED_OUTPATIENT_CLINIC_OR_DEPARTMENT_OTHER)
Admission: EM | Admit: 2022-07-09 | Discharge: 2022-07-09 | Disposition: A | Discharge status: Home / Self Care | Payer: Medicare Other | Attending: Emergency Medicine | Admitting: Emergency Medicine

## 2022-07-09 DIAGNOSIS — F419 Anxiety disorder, unspecified: Secondary | ICD-10-CM | POA: Diagnosis not present

## 2022-07-09 DIAGNOSIS — I959 Hypotension, unspecified: Secondary | ICD-10-CM | POA: Diagnosis not present

## 2022-07-09 DIAGNOSIS — Z9181 History of falling: Secondary | ICD-10-CM | POA: Diagnosis not present

## 2022-07-09 DIAGNOSIS — Z7951 Long term (current) use of inhaled steroids: Secondary | ICD-10-CM | POA: Diagnosis not present

## 2022-07-09 DIAGNOSIS — E782 Mixed hyperlipidemia: Secondary | ICD-10-CM | POA: Diagnosis not present

## 2022-07-09 DIAGNOSIS — T84195D Other mechanical complication of internal fixation device of left femur, subsequent encounter: Secondary | ICD-10-CM | POA: Diagnosis not present

## 2022-07-09 DIAGNOSIS — R519 Headache, unspecified: Secondary | ICD-10-CM | POA: Insufficient documentation

## 2022-07-09 DIAGNOSIS — S72142D Displaced intertrochanteric fracture of left femur, subsequent encounter for closed fracture with routine healing: Secondary | ICD-10-CM | POA: Diagnosis not present

## 2022-07-09 DIAGNOSIS — Z96652 Presence of left artificial knee joint: Secondary | ICD-10-CM | POA: Diagnosis not present

## 2022-07-09 DIAGNOSIS — Z7901 Long term (current) use of anticoagulants: Secondary | ICD-10-CM | POA: Insufficient documentation

## 2022-07-09 DIAGNOSIS — M1711 Unilateral primary osteoarthritis, right knee: Secondary | ICD-10-CM | POA: Diagnosis not present

## 2022-07-09 DIAGNOSIS — J309 Allergic rhinitis, unspecified: Secondary | ICD-10-CM | POA: Diagnosis not present

## 2022-07-09 DIAGNOSIS — K219 Gastro-esophageal reflux disease without esophagitis: Secondary | ICD-10-CM | POA: Diagnosis not present

## 2022-07-09 DIAGNOSIS — R9431 Abnormal electrocardiogram [ECG] [EKG]: Secondary | ICD-10-CM | POA: Diagnosis not present

## 2022-07-09 DIAGNOSIS — M542 Cervicalgia: Secondary | ICD-10-CM | POA: Diagnosis not present

## 2022-07-09 DIAGNOSIS — M62838 Other muscle spasm: Secondary | ICD-10-CM | POA: Insufficient documentation

## 2022-07-09 DIAGNOSIS — Z8616 Personal history of COVID-19: Secondary | ICD-10-CM | POA: Diagnosis not present

## 2022-07-09 LAB — CBC WITH DIFFERENTIAL/PLATELET
Abs Immature Granulocytes: 0.04 10*3/uL (ref 0.00–0.07)
Basophils Absolute: 0 10*3/uL (ref 0.0–0.1)
Basophils Relative: 0 %
Eosinophils Absolute: 0.1 10*3/uL (ref 0.0–0.5)
Eosinophils Relative: 1 %
HCT: 36.4 % (ref 36.0–46.0)
Hemoglobin: 11.5 g/dL — ABNORMAL LOW (ref 12.0–15.0)
Immature Granulocytes: 0 %
Lymphocytes Relative: 12 %
Lymphs Abs: 1.2 10*3/uL (ref 0.7–4.0)
MCH: 28.8 pg (ref 26.0–34.0)
MCHC: 31.6 g/dL (ref 30.0–36.0)
MCV: 91.2 fL (ref 80.0–100.0)
Monocytes Absolute: 0.9 10*3/uL (ref 0.1–1.0)
Monocytes Relative: 9 %
Neutro Abs: 7.8 10*3/uL — ABNORMAL HIGH (ref 1.7–7.7)
Neutrophils Relative %: 78 %
Platelets: 386 10*3/uL (ref 150–400)
RBC: 3.99 MIL/uL (ref 3.87–5.11)
RDW: 14.6 % (ref 11.5–15.5)
WBC: 10 10*3/uL (ref 4.0–10.5)
nRBC: 0 % (ref 0.0–0.2)

## 2022-07-09 LAB — BASIC METABOLIC PANEL
Anion gap: 9 (ref 5–15)
BUN: 13 mg/dL (ref 8–23)
CO2: 23 mmol/L (ref 22–32)
Calcium: 8.8 mg/dL — ABNORMAL LOW (ref 8.9–10.3)
Chloride: 103 mmol/L (ref 98–111)
Creatinine, Ser: 0.56 mg/dL (ref 0.44–1.00)
GFR, Estimated: >60 mL/min (ref >60)
Glucose, Bld: 106 mg/dL — ABNORMAL HIGH (ref 70–99)
Potassium: 3.5 mmol/L (ref 3.5–5.1)
Sodium: 135 mmol/L (ref 135–145)

## 2022-07-09 MED ORDER — DIAZEPAM 5 MG PO TABS: 5.0000 mg | ORAL_TABLET | Freq: Two times a day (BID) | ORAL | 0 refills | Status: DC | PRN

## 2022-07-09 MED ORDER — DIAZEPAM 5 MG/ML IJ SOLN
5.0000 mg | Freq: Once | INTRAMUSCULAR | Status: AC
  Administered 2022-07-09: 5 mg via INTRAVENOUS
  Filled 2022-07-09: qty 2

## 2022-07-09 MED ORDER — IOHEXOL 350 MG/ML SOLN
75.0000 mL | Freq: Once | INTRAVENOUS | Status: AC | PRN
  Administered 2022-07-09: 75 mL via INTRAVENOUS

## 2022-07-09 MED ORDER — HYDROMORPHONE HCL 1 MG/ML IJ SOLN
1.0000 mg | Freq: Once | INTRAMUSCULAR | Status: AC
  Administered 2022-07-09: 1 mg via INTRAVENOUS
  Filled 2022-07-09: qty 1

## 2022-07-09 NOTE — Discharge Instructions (Addendum)
You are being prescribed a strong muscle relaxer called Valium.  This is a benzodiazepine.  Do NOT take this at the same time or within 2 hours of the Oxycodone. Do NOT use alcohol. Do NOT use the Robaxin.  If you develop fever, if the light hurts your eyes/gives you a headache, you develop new or worsening headache, new or worsening neck pain or stiffness, or any other new/concerning symptoms then return to the ER or call 911.  You may still take Tylenol in addition to the other medicines.

## 2022-07-09 NOTE — ED Notes (Signed)
RN reviewed discharge instructions w/ pt and her husband, including prescriptions and pain management. Pt had no further questions. RN took pt out to car in wheelchair.

## 2022-07-09 NOTE — ED Provider Notes (Signed)
[facility] [place] Provider Note   CSN: [id number] Arrival date & time: 07/09/22  1738     History  Chief Complaint  Patient presents with   Neck Pain    Julia Murray is a 67 y.o. female.  HPI 67 year old female with a history of arthritis, GERD, hyperlipidemia and a recent femur fracture requiring multiple different surgeries presents with severe neck pain.  She states that last night when she went to bed she was feeling okay.  She woke up in the middle the night and noted that she was in a weird position on her bed.  This morning when she woke up she had some posterior neck pain which she assumed was from sleeping on it wrong.  However throughout the day its gotten worse.  She was in the car this evening and it was so bad that she has to come to the hospital.  She has taken oxycodone and a dose of her muscle relaxer (Robaxin) but neither of these has helped.  She has not had a fever.  She feels like it is starting to give her a little bit of an occipital headache and she felt like her occiput was transiently numb.  She had some transient blurred vision when she was in the waiting room but she thinks that is because she was crying due to the pain.  She is still on Eliquis and is due to finish this in a few days.  She denies any focal weakness or numbness that is new.  No vomiting.  When she moves her neck in any direction and it causes severe pain and so she can barely move. No trauma.  She does not feel like she is having any trouble breathing, swallowing, or any anterior neck swelling.  Home Medications Prior to Admission medications   Medication Sig Start Date End Date Taking? Authorizing Provider  diazepam (VALIUM) 5 MG tablet Take 1 tablet (5 mg total) by mouth every 12 (twelve) hours as needed for muscle spasms. 07/09/22  Yes Sherwood Gambler, MD  acetaminophen (TYLENOL) 325 MG tablet Take 1-2 tablets (325-650 mg total) by mouth every 6 (six) hours  as needed for mild pain (pain score 1-3 or temp > 100.5). Patient not taking: Reported on 06/11/2022 05/28/22   Corinne Ports, PA-C  apixaban (ELIQUIS) 2.5 MG TABS tablet Take 1 tablet (2.5 mg total) by mouth 2 (two) times daily. 05/28/22 06/27/22  Corinne Ports, PA-C  atorvastatin (LIPITOR) 20 MG tablet Take 20 mg by mouth daily.    [provider]  fluticasone (FLONASE) 50 MCG/ACT nasal spray SPRAY TWO SPRAYS IN EACH NOSTRIL ONCE DAILY Patient taking differently: Place 2 sprays into both nostrils daily. 12/12/20   Elby Showers, MD  Multiple Vitamins-Minerals (MULTIVITAMIN WITH MINERALS) tablet Take 1 tablet by mouth daily.    [provider]  omeprazole (PRILOSEC) 20 MG capsule Take 20 mg by mouth daily as needed (heartburn).    [provider]  ondansetron (ZOFRAN) 4 MG tablet Take 1 tablet (4 mg total) by mouth every 6 (six) hours as needed for nausea. 06/13/22   Corinne Ports, PA-C  oxyCODONE (OXY IR/ROXICODONE) 5 MG immediate release tablet Take 1 tablet (5 mg total) by mouth every 4 (four) hours as needed for severe pain. 06/13/22   Corinne Ports, PA-C  valACYclovir (VALTREX) 500 MG tablet TAKE ONE TABLET BY MOUTH ONCE WEEKLY WHICH PREVENTS OUTBREAKS OF HSV Patient not taking: Reported  on 06/11/2022 03/13/21   Elby Showers, MD      Allergies    Shellfish allergy, Bee venom, and Crab extract allergy skin test    Review of Systems   Review of Systems  Respiratory:  Negative for shortness of breath.   Cardiovascular:  Negative for chest pain.  Gastrointestinal:  Negative for vomiting.  Musculoskeletal:  Positive for neck pain and neck stiffness.  Neurological:  Positive for headaches. Negative for weakness and numbness.    Physical Exam Updated Vital Signs BP (!) 141/81   Pulse 85   Temp (!) 97.4 F (36.3 C)   Resp 20   Ht '5\' 4"'$  (1.626 m)   Wt 77.1 kg   SpO2 96%   BMI 29.18 kg/m  Physical Exam Vitals and nursing note reviewed.  Constitutional:       Appearance: She is well-developed.  HENT:     Head: Normocephalic and atraumatic.  Eyes:     Extraocular Movements: Extraocular movements intact.  Neck:     Comments: There is no significant anterior neck tenderness, though she can "feel it" when I touch her neck Posterior there some mild diffuse tenderness paraspinal neck, but no obvious swelling or one area that's tender. Cardiovascular:     Rate and Rhythm: Normal rate and regular rhythm.     Heart sounds: Normal heart sounds.  Pulmonary:     Effort: Pulmonary effort is normal.     Breath sounds: Normal breath sounds.  Abdominal:     General: There is no distension.  Skin:    General: Skin is warm and dry.  Neurological:     Mental Status: She is alert.     Comments: CN 3-12 grossly intact. 5/5 strength in all 4 extremities save for trouble with the LLE, which has been a problem since surgery. Grossly normal sensation. Normal finger to nose.      ED Results / Procedures / Treatments   Labs (all labs ordered are listed, but only abnormal results are displayed) Labs Reviewed  BASIC METABOLIC PANEL - Abnormal; Notable for the following components:      Result Value   Glucose, Bld 106 (*)    Calcium 8.8 (*)    All other components within normal limits  CBC WITH DIFFERENTIAL/PLATELET - Abnormal; Notable for the following components:   Hemoglobin 11.5 (*)    Neutro Abs 7.8 (*)    All other components within normal limits    EKG EKG Interpretation  Date/Time:  Monday July 09 2022 19:19:28 EST Ventricular Rate:  90 PR Interval:  171 QRS Duration: 89 QT Interval:  357 QTC Calculation: 437 R Axis:   5 Text Interpretation: Sinus rhythm Low voltage, precordial leads no acute ST/T changes Confirmed by Sherwood Gambler [id number]) on 07/09/2022 7:43:37 PM  Radiology CT ANGIO HEAD NECK W WO CM  Result Date: 07/09/2022 CLINICAL DATA:  Neck pain EXAM: CT ANGIOGRAPHY HEAD AND NECK TECHNIQUE: Multidetector CT imaging of the head  and neck was performed using the standard protocol during bolus administration of intravenous contrast. Multiplanar CT image reconstructions and MIPs were obtained to evaluate the vascular anatomy. Carotid stenosis measurements (when applicable) are obtained utilizing NASCET criteria, using the distal internal carotid diameter as the denominator. RADIATION DOSE REDUCTION: This exam was performed according to the departmental dose-optimization program which includes automated exposure control, adjustment of the mA and/or kV according to patient size and/or use of iterative reconstruction technique. CONTRAST:  61m OMNIPAQUE IOHEXOL 350 MG/ML SOLN COMPARISON:  None Available. FINDINGS: CT HEAD FINDINGS Brain: No evidence of acute infarct, hemorrhage, mass, mass effect, or midline shift. No hydrocephalus or extra-axial fluid collection. Periventricular white matter changes, likely the sequela of chronic small vessel ischemic disease. Vascular: No hyperdense vessel. Skull: Negative for fracture or focal lesion. Sinuses/Orbits: No acute finding. Status post bilateral lens replacements. Other: The mastoid air cells are well aerated. CTA NECK FINDINGS Aortic arch: Two-vessel arch with a common origin of the brachiocephalic and left common carotid arteries. Imaged portion shows no evidence of aneurysm or dissection. No significant stenosis of the major arch vessel origins. Right carotid system: No evidence of dissection, occlusion, or hemodynamically significant stenosis (greater than 50%). Left carotid system: No evidence of dissection, occlusion, or hemodynamically significant stenosis (greater than 50%). Vertebral arteries: No evidence of dissection, occlusion, or hemodynamically significant stenosis (greater than 50%). Skeleton: No acute fracture or suspicious osseous lesion. Degenerative changes in the cervical spine, with reversal of the normal cervical lordosis. 3 mm anterolisthesis of C3 on C4 and C4 on C5, which  appears facet mediated. Other neck: 7 mm hypoenhancing lesion in the right thyroid lobe, for which no follow-up is currently indicated. (Reference: J Am Coll Radiol. 2015 Feb;12(2): 143-50) Upper chest: No focal pulmonary opacity or pleural effusion. Review of the MIP images confirms the above findings CTA HEAD FINDINGS Anterior circulation: Both internal carotid arteries are patent to the termini, without significant stenosis. A1 segments patent. Normal anterior communicating artery. Anterior cerebral arteries are patent to their distal aspects. No M1 stenosis or occlusion. MCA branches perfused and symmetric. Posterior circulation: Vertebral arteries patent to the vertebrobasilar junction without stenosis. Posterior inferior cerebellar arteries patent proximally. Basilar patent to its distal aspect. Superior cerebellar arteries patent proximally. Patent P1 segments. PCAs perfused to their distal aspects without stenosis. The bilateral posterior communicating arteries are patent. Venous sinuses: As permitted by contrast timing, patent. Anatomic variants: None significant. Review of the MIP images confirms the above findings IMPRESSION: 1. No acute intracranial process. 2. No intracranial large vessel occlusion or significant stenosis. 3. No hemodynamically significant stenosis in the neck. Electronically Signed   By: Merilyn Baba M.D.   On: 07/09/2022 20:25    Procedures Procedures    Medications Ordered in ED Medications  HYDROmorphone (DILAUDID) injection 1 mg (1 mg Intravenous Given 07/09/22 1921)  iohexol (OMNIPAQUE) 350 MG/ML injection 75 mL (75 mLs Intravenous Contrast Given 07/09/22 1949)  diazepam (VALIUM) injection 5 mg (5 mg Intravenous Given 07/09/22 2116)    ED Course/ Medical Decision Making/ A&P                             Medical Decision Making Amount and/or Complexity of Data Reviewed Labs: ordered.    Details: Normal WBC, unremarkable electrolytes. Radiology: ordered and  independent interpretation performed.    Details: No arterial dissection. ECG/medicine tests: ordered and independent interpretation performed.    Details: No acute ischemia.  Risk Prescription drug management.   I suspect her neck pain is primarily muscular.  After a dose of Dilaudid she is able to move her neck a little better though it started getting a little worse again.  She was given some IV Valium and this also seemed to help.  Will take her off Robaxin and put her on Valium instead.  Highly doubt CNS emergency such as infection.  She has no photophobia, fever, vomiting, etc.  She is also on a blood thinner so  an LP would technically be contraindicated, however as stated above I have a very low suspicion of a CNS infection.  This is likely muscular from the way she slept.  At this point she is feeling better and feels well enough to go home.  I have given her a short course of Valium to take at home though I have stressed the importance of not taking this at the same time or near the oxycodone as combined these could cause deleterious effects.  Will give return precautions but she appears stable for discharge.  Highly doubt ACS.        Final Clinical Impression(s) / ED Diagnoses Final diagnoses:  Muscle spasms of neck    Rx / DC Orders ED Discharge Orders          Ordered    diazepam (VALIUM) 5 MG tablet  Every 12 hours PRN        07/09/22 2152              Sherwood Gambler, MD 07/09/22 2330

## 2022-07-09 NOTE — ED Triage Notes (Addendum)
Patient presents to ED via POV from home. Patient reports neck pain. Denies injury or trauma. Crying in triage. Patient took oxycodone and a muscle relaxer PTA with no relief. Unable to turn/move her neck.

## 2022-07-17 DIAGNOSIS — S7222XD Displaced subtrochanteric fracture of left femur, subsequent encounter for closed fracture with routine healing: Secondary | ICD-10-CM | POA: Diagnosis not present

## 2022-07-26 DIAGNOSIS — E785 Hyperlipidemia, unspecified: Secondary | ICD-10-CM | POA: Diagnosis not present

## 2022-07-26 DIAGNOSIS — S72345S Nondisplaced spiral fracture of shaft of left femur, sequela: Secondary | ICD-10-CM | POA: Diagnosis not present

## 2022-07-26 DIAGNOSIS — M62838 Other muscle spasm: Secondary | ICD-10-CM | POA: Diagnosis not present

## 2022-07-26 DIAGNOSIS — K219 Gastro-esophageal reflux disease without esophagitis: Secondary | ICD-10-CM | POA: Diagnosis not present

## 2022-07-27 DIAGNOSIS — M25552 Pain in left hip: Secondary | ICD-10-CM | POA: Diagnosis not present

## 2022-07-27 DIAGNOSIS — Z96652 Presence of left artificial knee joint: Secondary | ICD-10-CM | POA: Diagnosis not present

## 2022-07-27 DIAGNOSIS — M1711 Unilateral primary osteoarthritis, right knee: Secondary | ICD-10-CM | POA: Diagnosis not present

## 2022-07-30 DIAGNOSIS — M25552 Pain in left hip: Secondary | ICD-10-CM | POA: Diagnosis not present

## 2022-08-01 DIAGNOSIS — M25552 Pain in left hip: Secondary | ICD-10-CM | POA: Diagnosis not present

## 2022-08-06 DIAGNOSIS — M25552 Pain in left hip: Secondary | ICD-10-CM | POA: Diagnosis not present

## 2022-08-07 DIAGNOSIS — M25552 Pain in left hip: Secondary | ICD-10-CM | POA: Diagnosis not present

## 2022-08-14 DIAGNOSIS — M25552 Pain in left hip: Secondary | ICD-10-CM | POA: Diagnosis not present

## 2022-08-14 DIAGNOSIS — S7222XD Displaced subtrochanteric fracture of left femur, subsequent encounter for closed fracture with routine healing: Secondary | ICD-10-CM | POA: Diagnosis not present

## 2022-08-16 DIAGNOSIS — M25552 Pain in left hip: Secondary | ICD-10-CM | POA: Diagnosis not present

## 2022-08-21 DIAGNOSIS — M25552 Pain in left hip: Secondary | ICD-10-CM | POA: Diagnosis not present

## 2022-08-23 DIAGNOSIS — M25552 Pain in left hip: Secondary | ICD-10-CM | POA: Diagnosis not present

## 2022-08-28 DIAGNOSIS — M25552 Pain in left hip: Secondary | ICD-10-CM | POA: Diagnosis not present

## 2022-08-30 DIAGNOSIS — M25552 Pain in left hip: Secondary | ICD-10-CM | POA: Diagnosis not present

## 2022-09-03 DIAGNOSIS — M25552 Pain in left hip: Secondary | ICD-10-CM | POA: Diagnosis not present

## 2022-09-11 ENCOUNTER — Ambulatory Visit: Payer: Self-pay | Admitting: Student

## 2022-09-11 DIAGNOSIS — S7222XD Displaced subtrochanteric fracture of left femur, subsequent encounter for closed fracture with routine healing: Secondary | ICD-10-CM | POA: Diagnosis not present

## 2022-09-11 DIAGNOSIS — M25552 Pain in left hip: Secondary | ICD-10-CM | POA: Diagnosis not present

## 2022-09-13 DIAGNOSIS — M25552 Pain in left hip: Secondary | ICD-10-CM | POA: Diagnosis not present

## 2022-09-17 ENCOUNTER — Encounter (HOSPITAL_COMMUNITY): Payer: Self-pay | Admitting: Student

## 2022-09-17 ENCOUNTER — Other Ambulatory Visit: Payer: Self-pay

## 2022-09-17 IMAGING — DX DG KNEE 1-2V*L*
2 series · 2 of 2 positions shown · non-contrast
Comparison: None.

CLINICAL DATA: Left total knee arthroplasty

EXAM:
LEFT KNEE - 1-2 VIEW

[knee ap]
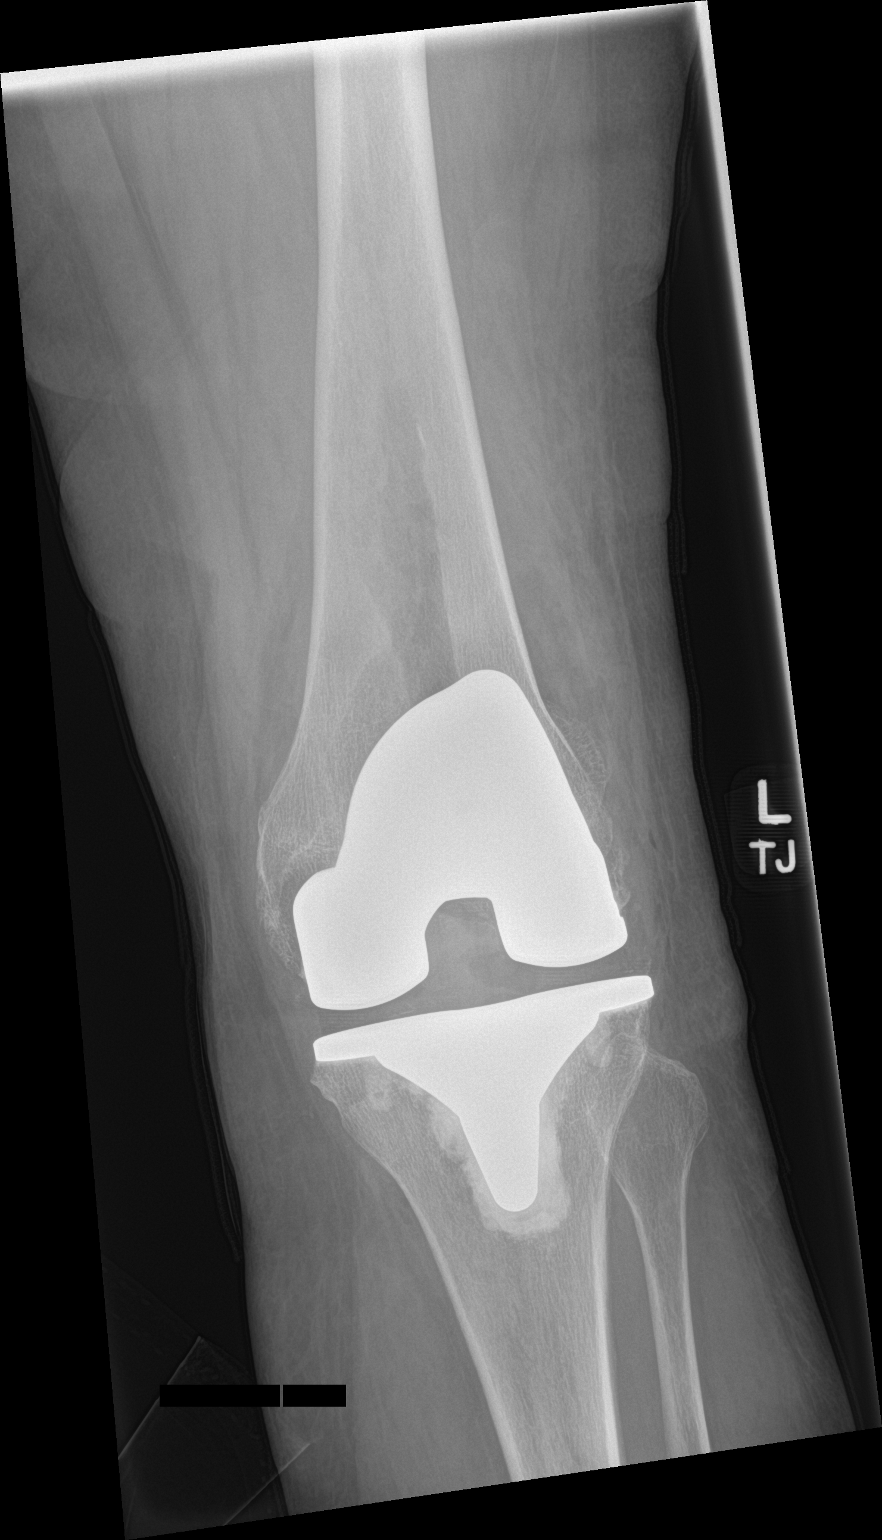

[knee lat]
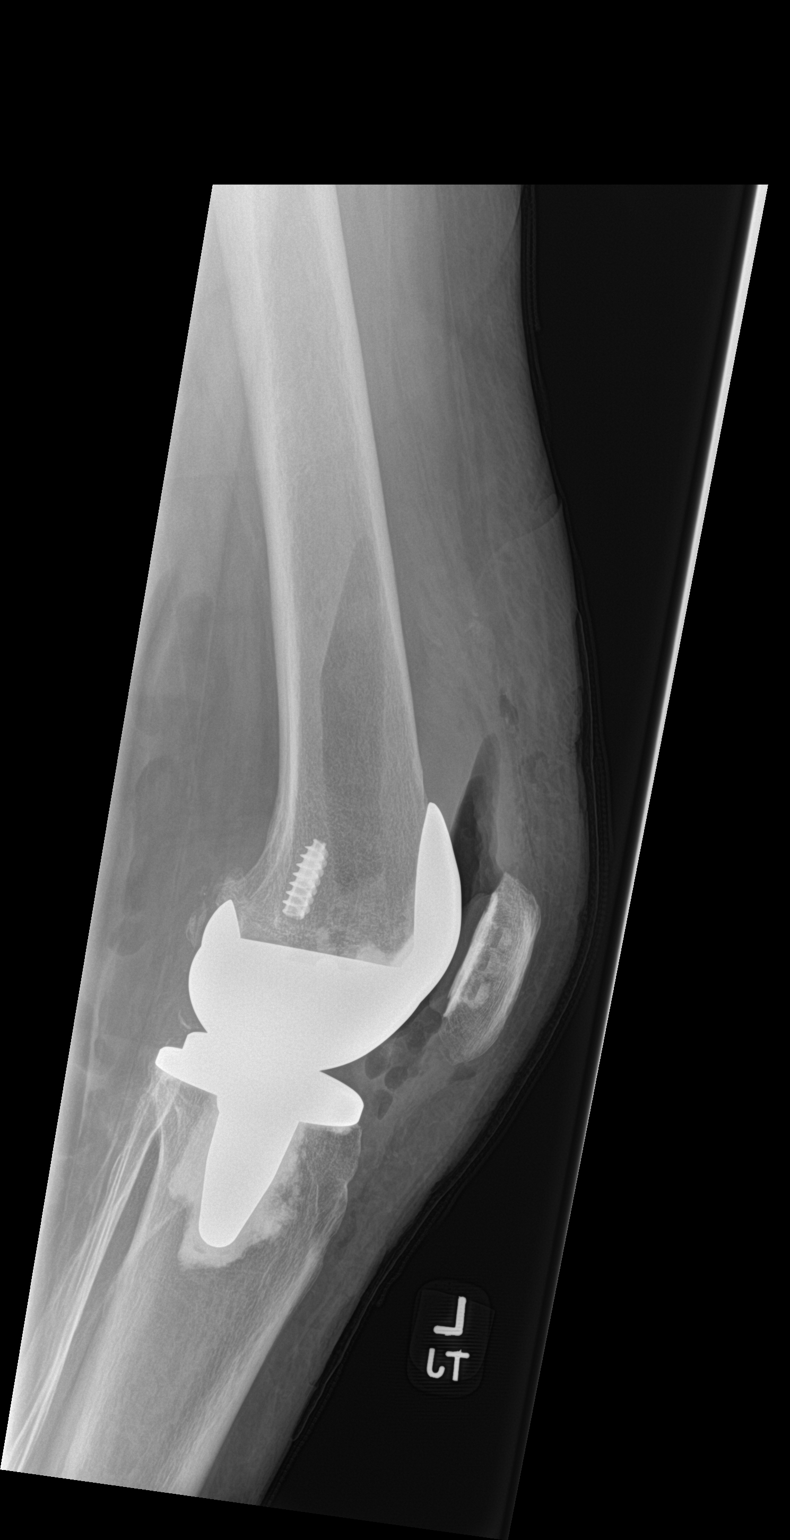

[2 of 2 positions shown; findings below may reference images not displayed]

FINDINGS: Postsurgical changes from left total knee arthroplasty. Arthroplasty
components are in their expected alignment. No periprosthetic
fracture or evidence of other complication. A screw fragment is seen
within the distal femur. Expected postoperative changes within the
overlying soft tissues.
IMPRESSION: Satisfactory postoperative appearance status post left total knee
arthroplasty without evidence of immediate postoperative
complication.

## 2022-09-17 NOTE — Pre-Procedure Instructions (Signed)
PCP - Rebecka Apley, NP  Chest x-ray - 05/27/22 EKG - 07/09/22  Blood Thinner Instructions: Eliquis stopped in Feb  Anesthesia review: N  Patient verbally denies any shortness of breath, fever, cough and chest pain during phone call   -------------  SDW INSTRUCTIONS given:  Your procedure is scheduled on Wednesday, Sep 19 2022.  Report to [facility] Main Entrance "A" at 0600 A.M., and check in at the Admitting office.  Call this number if you have problems the morning of surgery:  [phone]   Remember:  Do not eat or drink after midnight the night before your surgery    Take these medicines the morning of surgery with A SIP OF WATER  fluticasone (FLONASE)  omeprazole (PRILOSEC)-if needed acetaminophen (TYLENOL)-if needed  As of today, STOP taking any Aspirin (unless otherwise instructed by your surgeon) Aleve, Mobic, Naproxen, Ibuprofen, Motrin, Advil, Goody's, BC's, all herbal medications, fish oil, and all vitamins.                      Do not wear jewelry, make up, or nail polish            Do not wear lotions, powders, perfumes/colognes, or deodorant.            Do not shave 48 hours prior to surgery.  Men may shave face and neck.            Do not bring valuables to the hospital.            [facility] is not responsible for any belongings or valuables.  Do NOT Smoke (Tobacco/Vaping) 24 hours prior to your procedure If you use a CPAP at night, you may bring all equipment for your overnight stay.   Contacts, glasses, dentures or bridgework may not be worn into surgery.      For patients admitted to the hospital, discharge time will be determined by your treatment team.   Patients discharged the day of surgery will not be allowed to drive home, and someone needs to stay with them for 24 hours.    Special instructions:   [facility]- Preparing For Surgery  Before surgery, you can play an important role. Because skin is not sterile, your skin needs to be as  free of germs as possible. You can reduce the number of germs on your skin by washing with CHG (chlorahexidine gluconate) Soap before surgery.  CHG is an antiseptic cleaner which kills germs and bonds with the skin to continue killing germs even after washing.    Oral Hygiene is also important to reduce your risk of infection.  Remember - BRUSH YOUR TEETH THE MORNING OF SURGERY WITH YOUR REGULAR TOOTHPASTE  Please do not use if you have an allergy to CHG or antibacterial soaps. If your skin becomes reddened/irritated stop using the CHG.  Do not shave (including legs and underarms) for at least 48 hours prior to first CHG shower. It is OK to shave your face.  Please follow these instructions carefully.   Shower the NIGHT BEFORE SURGERY and the MORNING OF SURGERY with DIAL Soap.   Pat yourself dry with a CLEAN TOWEL.  Wear CLEAN PAJAMAS to bed the night before surgery  Place CLEAN SHEETS on your bed the night of your first shower and DO NOT SLEEP WITH PETS.   Day of Surgery: Please shower morning of surgery  Wear Clean/Comfortable clothing the morning of surgery Do not apply any deodorants/lotions.  Remember to brush your teeth WITH YOUR REGULAR TOOTHPASTE.   Questions were answered. Patient verbalized understanding of instructions.

## 2022-09-18 DIAGNOSIS — M25552 Pain in left hip: Secondary | ICD-10-CM | POA: Diagnosis not present

## 2022-09-18 NOTE — Anesthesia Preprocedure Evaluation (Signed)
Anesthesia Evaluation  Patient identified by MRN, date of birth, ID band Patient awake    Reviewed: Allergy & Precautions, NPO status , Patient's Chart, lab work & pertinent test results  Airway Mallampati: II  TM Distance: >3 FB Neck ROM: Full    Dental no notable dental hx.    Pulmonary neg pulmonary ROS   Pulmonary exam normal        Cardiovascular negative cardio ROS  Rhythm:Regular Rate:Normal     Neuro/Psych   Anxiety     negative neurological ROS     GI/Hepatic Neg liver ROS,GERD  Medicated,,  Endo/Other  negative endocrine ROS    Renal/GU   negative genitourinary   Musculoskeletal  (+) Arthritis , Osteoarthritis,    Abdominal Normal abdominal exam  (+)   Peds  Hematology negative hematology ROS (+)   Anesthesia Other Findings   Reproductive/Obstetrics                             Anesthesia Physical Anesthesia Plan  ASA: 2  Anesthesia Plan: General   Post-op Pain Management: Celebrex PO (pre-op)* and Tylenol PO (pre-op)*   Induction: Intravenous  PONV Risk Score and Plan: 3 and Ondansetron, Dexamethasone and Treatment may vary due to age or medical condition  Airway Management Planned: Mask and LMA  Additional Equipment: None  Intra-op Plan:   Post-operative Plan: Extubation in OR  Informed Consent: I have reviewed the patients History and Physical, chart, labs and discussed the procedure including the risks, benefits and alternatives for the proposed anesthesia with the patient or authorized representative who has indicated his/her understanding and acceptance.     Dental advisory given  Plan Discussed with: CRNA  Anesthesia Plan Comments:        Anesthesia Quick Evaluation

## 2022-09-18 NOTE — H&P (Signed)
Orthopaedic Trauma Service (OTS) Consult   Patient ID: Julia Murray MRN: [id number] DOB/AGE: 68-Apr-1957 68 y.o.  Reason for Surgery: hardware failure left femur  HPI: Julia Murray is an 67 y.o. female presenting for surgery on left lower extremity.  Patient sustained a fall in January 2024, unfortunately resulting in a left femur fracture.  She underwent intramedullary nailing by Dr. Jena Gauss on 05/27/2022.  Immediate postoperative course was uneventful but at 2 weeks postop, patient noted to have hardware failure of one of the distal interlocking screws.  The screw was removed in the operating room on 06/12/2022.  She was instructed be touchdown weightbearing left lower extremity postoperatively following hardware removal.  Patient now experiencing hardware failure and loosening of the distal interlocking screw again.  She has had notable healing of her fracture and now presents for removal of this distal interlocking screw.  She is ambulating with no assistive device.  Not currently on any anticoagulation.  Past Medical History:  Diagnosis Date   Arthritis    knee, back   COVID-19    2020   GERD (gastroesophageal reflux disease)    Hyperlipidemia     Past Surgical History:  Procedure Laterality Date   ANTERIOR CRUCIATE LIGAMENT REPAIR     left   AUGMENTATION MAMMAPLASTY Bilateral 2021   re-do   BREAST BIOPSY Bilateral    multiple times per patient   BREAST SURGERY     mutiple bx   COLONOSCOPY  04/04/2008   at [facility] done   HARDWARE REMOVAL Left 06/12/2022   Procedure: EXCHANGE DISTAL FEMUR INTERLOCKING SCREWS;  Surgeon: Roby Lofts, MD;  Location: [facility] OR;  Service: Orthopedics;  Laterality: Left;   INTRAMEDULLARY (IM) NAIL INTERTROCHANTERIC Left 05/27/2022   Procedure: INTRAMEDULLARY (IM) NAIL INTERTROCHANTERIC;  Surgeon: Roby Lofts, MD;  Location: [facility] OR;  Service: Orthopedics;  Laterality: Left;   KNEE ARTHROSCOPY  05/20/2012   Procedure: ARTHROSCOPY KNEE;   Surgeon: Javier Docker, MD;  Location: [facility];  Service: Orthopedics;  Laterality: Right;  right knee scope with incision and drainage and removal of loose foreign body    KNEE ARTHROSCOPY WITH MEDIAL MENISECTOMY  04/25/2012   Procedure: KNEE ARTHROSCOPY WITH MEDIAL MENISECTOMY;  Surgeon: Javier Docker, MD;  Location: [facility];  Service: Orthopedics;  Laterality: Right;  Right Knee Arthrscopy with Partial Medial and Lateral Menisectomy with Removal of Loose Bodies   TONSILLECTOMY     TOTAL KNEE ARTHROPLASTY Left 04/28/2021   Procedure: TOTAL KNEE ARTHROPLASTY, REMOVAL OF HARDWARE (ACL SCREWS);  Surgeon: Jene Every, MD;  Location: [facility] ORS;  Service: Orthopedics;  Laterality: Left;    Family History  Problem Relation Age of Onset   Heart disease Father    Heart attack Father    Diabetes Mother    Colon cancer Neg Hx    Esophageal cancer Neg Hx    Rectal cancer Neg Hx    Stomach cancer Neg Hx    Colon polyps Neg Hx     Social History:  reports that she has never smoked. She has never used smokeless tobacco. She reports current alcohol use of about 8.0 standard drinks of alcohol per week. She reports that she does not use drugs.  Allergies:  Allergies  Allergen Reactions   Shellfish Allergy Hives and Swelling   Bee Venom Swelling   Crab Extract Hives and Swelling    Medications: I have reviewed the patient's current medications. Prior to Admission:  No medications  prior to admission.    ROS: Constitutional: No fever or chills Vision: No changes in vision ENT: No difficulty swallowing CV: No chest pain Pulm: No SOB or wheezing GI: No nausea or vomiting GU: No urgency or inability to hold urine Skin: No poor wound healing Neurologic: No numbness or tingling Psychiatric: No depression or anxiety Heme: No bruising Allergic: No reaction to medications or food   Exam: Height 5\' 4"  (1.626 m), weight 75.8 kg. General: No acute  distress Orientation: Alert and oriented x 4 Mood and Affect: Mood and affect appropriate, pleasant and cooperative Gait: Within normal limits Coordination and balance: Within normal limits  Left lower extremity: Well-healed surgical incisions.  Tenderness and prominence over the distal interlocking screw.  Otherwise no significant tenderness with palpation throughout the thigh.  Tolerates gentle knee and hip range of motion.  Endorses sensation that extremity.  Neurovascularly intact.  Right lower extremity: Skin without lesions. No tenderness to palpation. Full painless ROM, full strength in each muscle groups without evidence of instability.   Medical Decision Making: Data: Imaging: AP and lateral views of the left femur show intramedullary nail in place.  There has been notable healing on both the medial and lateral cortex.  Distal interlocking screw has backed out significantly.    Labs: No results found for this or any previous visit (from the past 168 hour(s)).   Assessment/Plan: 67 year old female with hardware failure s/p IMN left femur fracture 05/27/2022  Patient has had a sufficient amount of healing throughout the femur fracture. At this time it is safe and reasonable to move forward with removal of the distal interlocking screw.  Risk and benefits of the procedure have been discussed with the patient.  She agrees to proceed with surgery.  She will continue weightbearing as tolerated on the left lower extremity postoperatively.  Will plan to discharge her home from the PACU.  Thompson Caul PA-C [facility] [phone] (office) [email or web address]

## 2022-09-19 ENCOUNTER — Ambulatory Visit (HOSPITAL_BASED_OUTPATIENT_CLINIC_OR_DEPARTMENT_OTHER): Payer: Medicare Other | Admitting: Anesthesiology

## 2022-09-19 ENCOUNTER — Other Ambulatory Visit: Payer: Self-pay

## 2022-09-19 ENCOUNTER — Ambulatory Visit (HOSPITAL_COMMUNITY): Payer: Medicare Other | Admitting: Anesthesiology

## 2022-09-19 ENCOUNTER — Ambulatory Visit (HOSPITAL_COMMUNITY): Payer: Medicare Other

## 2022-09-19 ENCOUNTER — Ambulatory Visit (HOSPITAL_COMMUNITY)
Admission: RE | Admit: 2022-09-19 | Discharge: 2022-09-19 | Disposition: A | Discharge status: Home / Self Care | Payer: Medicare Other | Attending: Student | Admitting: Student

## 2022-09-19 ENCOUNTER — Encounter (HOSPITAL_COMMUNITY)
Admission: RE | Disposition: A | Discharge status: Home / Self Care | Payer: Self-pay | Source: Home / Self Care | Attending: Student

## 2022-09-19 ENCOUNTER — Encounter (HOSPITAL_COMMUNITY): Payer: Self-pay | Admitting: Student

## 2022-09-19 DIAGNOSIS — T8484XA Pain due to internal orthopedic prosthetic devices, implants and grafts, initial encounter: Secondary | ICD-10-CM

## 2022-09-19 DIAGNOSIS — S7222XA Displaced subtrochanteric fracture of left femur, initial encounter for closed fracture: Secondary | ICD-10-CM | POA: Diagnosis not present

## 2022-09-19 DIAGNOSIS — Y831 Surgical operation with implant of artificial internal device as the cause of abnormal reaction of the patient, or of later complication, without mention of misadventure at the time of the procedure: Secondary | ICD-10-CM | POA: Insufficient documentation

## 2022-09-19 DIAGNOSIS — F419 Anxiety disorder, unspecified: Secondary | ICD-10-CM

## 2022-09-19 DIAGNOSIS — T84195A Other mechanical complication of internal fixation device of left femur, initial encounter: Secondary | ICD-10-CM | POA: Diagnosis not present

## 2022-09-19 DIAGNOSIS — Z0389 Encounter for observation for other suspected diseases and conditions ruled out: Secondary | ICD-10-CM | POA: Diagnosis not present

## 2022-09-19 DIAGNOSIS — T85698A Other mechanical complication of other specified internal prosthetic devices, implants and grafts, initial encounter: Secondary | ICD-10-CM | POA: Diagnosis not present

## 2022-09-19 HISTORY — PX: HARDWARE REMOVAL: SHX979

## 2022-09-19 LAB — CBC
HCT: 44.2 % (ref 36.0–46.0)
Hemoglobin: 13.9 g/dL (ref 12.0–15.0)
MCH: 28.2 pg (ref 26.0–34.0)
MCHC: 31.4 g/dL (ref 30.0–36.0)
MCV: 89.7 fL (ref 80.0–100.0)
Platelets: 309 10*3/uL (ref 150–400)
RBC: 4.93 MIL/uL (ref 3.87–5.11)
RDW: 18.8 % — ABNORMAL HIGH (ref 11.5–15.5)
WBC: 4.7 10*3/uL (ref 4.0–10.5)
nRBC: 0 % (ref 0.0–0.2)

## 2022-09-19 SURGERY — REMOVAL, HARDWARE
Anesthesia: General | Laterality: Left

## 2022-09-19 MED ORDER — MIDAZOLAM HCL 2 MG/2ML IJ SOLN
INTRAMUSCULAR | Status: DC | PRN
  Administered 2022-09-19: 1 mg via INTRAVENOUS

## 2022-09-19 MED ORDER — ONDANSETRON HCL 4 MG/2ML IJ SOLN
INTRAMUSCULAR | Status: DC | PRN
  Administered 2022-09-19: 4 mg via INTRAVENOUS

## 2022-09-19 MED ORDER — PROPOFOL 10 MG/ML IV BOLUS
INTRAVENOUS | Status: AC
  Filled 2022-09-19: qty 20

## 2022-09-19 MED ORDER — LIDOCAINE 2% (20 MG/ML) 5 ML SYRINGE
INTRAMUSCULAR | Status: DC | PRN
  Administered 2022-09-19: 30 mg via INTRAVENOUS

## 2022-09-19 MED ORDER — FENTANYL CITRATE (PF) 250 MCG/5ML IJ SOLN
INTRAMUSCULAR | Status: DC | PRN
  Administered 2022-09-19: 100 ug via INTRAVENOUS

## 2022-09-19 MED ORDER — LACTATED RINGERS IV SOLN: INTRAVENOUS | Status: DC

## 2022-09-19 MED ORDER — PROPOFOL 10 MG/ML IV BOLUS
INTRAVENOUS | Status: DC | PRN
  Administered 2022-09-19: 150 mg via INTRAVENOUS

## 2022-09-19 MED ORDER — MIDAZOLAM HCL 2 MG/2ML IJ SOLN
INTRAMUSCULAR | Status: AC
  Filled 2022-09-19: qty 2

## 2022-09-19 MED ORDER — FENTANYL CITRATE (PF) 250 MCG/5ML IJ SOLN
INTRAMUSCULAR | Status: AC
  Filled 2022-09-19: qty 5

## 2022-09-19 MED ORDER — 0.9 % SODIUM CHLORIDE (POUR BTL) OPTIME
TOPICAL | Status: DC | PRN
  Administered 2022-09-19: 1000 mL

## 2022-09-19 MED ORDER — CELECOXIB 200 MG PO CAPS
200.0000 mg | ORAL_CAPSULE | Freq: Once | ORAL | Status: AC
  Administered 2022-09-19: 200 mg via ORAL
  Filled 2022-09-19: qty 1

## 2022-09-19 MED ORDER — ORAL CARE MOUTH RINSE: 15.0000 mL | Freq: Once | OROMUCOSAL | Status: AC

## 2022-09-19 MED ORDER — CEFAZOLIN SODIUM-DEXTROSE 2-4 GM/100ML-% IV SOLN
2.0000 g | INTRAVENOUS | Status: AC
  Administered 2022-09-19: 2 g via INTRAVENOUS
  Filled 2022-09-19: qty 100

## 2022-09-19 MED ORDER — ACETAMINOPHEN 10 MG/ML IV SOLN: 1000.0000 mg | Freq: Once | INTRAVENOUS | Status: DC | PRN

## 2022-09-19 MED ORDER — DEXAMETHASONE SODIUM PHOSPHATE 10 MG/ML IJ SOLN
INTRAMUSCULAR | Status: DC | PRN
  Administered 2022-09-19: 5 mg via INTRAVENOUS

## 2022-09-19 MED ORDER — ACETAMINOPHEN 500 MG PO TABS: 1000.0000 mg | ORAL_TABLET | Freq: Once | ORAL | Status: DC

## 2022-09-19 MED ORDER — FENTANYL CITRATE (PF) 100 MCG/2ML IJ SOLN: 25.0000 ug | INTRAMUSCULAR | Status: DC | PRN

## 2022-09-19 MED ORDER — EPHEDRINE SULFATE-NACL 50-0.9 MG/10ML-% IV SOSY
PREFILLED_SYRINGE | INTRAVENOUS | Status: DC | PRN
  Administered 2022-09-19: 5 mg via INTRAVENOUS

## 2022-09-19 MED ORDER — CHLORHEXIDINE GLUCONATE 0.12 % MT SOLN
15.0000 mL | Freq: Once | OROMUCOSAL | Status: AC
  Administered 2022-09-19: 15 mL via OROMUCOSAL
  Filled 2022-09-19: qty 15

## 2022-09-19 SURGICAL SUPPLY — 65 items
APL PRP STRL LF DISP 70% ISPRP (MISCELLANEOUS) ×2
BAG COUNTER SPONGE SURGICOUNT (BAG) ×2 IMPLANT
BAG SPNG CNTER NS LX DISP (BAG) ×1
BANDAGE ESMARK 6X9 LF (GAUZE/BANDAGES/DRESSINGS) ×2 IMPLANT
BNDG CMPR 5X6 CHSV STRCH STRL (GAUZE/BANDAGES/DRESSINGS)
BNDG CMPR 9X6 STRL LF SNTH (GAUZE/BANDAGES/DRESSINGS)
BNDG COHESIVE 6X5 TAN ST LF (GAUZE/BANDAGES/DRESSINGS) ×2 IMPLANT
BNDG ELASTIC 4X5.8 VLCR STR LF (GAUZE/BANDAGES/DRESSINGS) ×2 IMPLANT
BNDG ELASTIC 6X5.8 VLCR STR LF (GAUZE/BANDAGES/DRESSINGS) ×2 IMPLANT
BNDG ESMARK 6X9 LF (GAUZE/BANDAGES/DRESSINGS)
BNDG GAUZE DERMACEA FLUFF 4 (GAUZE/BANDAGES/DRESSINGS) ×4 IMPLANT
BNDG GZE DERMACEA 4 6PLY (GAUZE/BANDAGES/DRESSINGS)
BRUSH SCRUB EZ PLAIN DRY (MISCELLANEOUS) ×4 IMPLANT
CHLORAPREP W/TINT 26 (MISCELLANEOUS) ×2 IMPLANT
COVER SURGICAL LIGHT HANDLE (MISCELLANEOUS) ×4 IMPLANT
CUFF TOURN SGL QUICK 18X4 (TOURNIQUET CUFF) IMPLANT
CUFF TOURN SGL QUICK 24 (TOURNIQUET CUFF)
CUFF TOURN SGL QUICK 34 (TOURNIQUET CUFF)
CUFF TRNQT CYL 24X4X16.5-23 (TOURNIQUET CUFF) IMPLANT
CUFF TRNQT CYL 34X4.125X (TOURNIQUET CUFF) IMPLANT
DRAPE C-ARM 42X72 X-RAY (DRAPES) IMPLANT
DRAPE C-ARMOR (DRAPES) ×2 IMPLANT
DRAPE U-SHAPE 47X51 STRL (DRAPES) ×2 IMPLANT
DRESSING MEPILEX FLEX 4X4 (GAUZE/BANDAGES/DRESSINGS) IMPLANT
DRSG ADAPTIC 3X8 NADH LF (GAUZE/BANDAGES/DRESSINGS) ×2 IMPLANT
DRSG MEPILEX FLEX 4X4 (GAUZE/BANDAGES/DRESSINGS) ×1
ELECT REM PT RETURN 9FT ADLT (ELECTROSURGICAL) ×1
ELECTRODE REM PT RTRN 9FT ADLT (ELECTROSURGICAL) ×2 IMPLANT
GAUZE SPONGE 4X4 12PLY STRL (GAUZE/BANDAGES/DRESSINGS) ×2 IMPLANT
GLOVE BIO SURGEON STRL SZ 6.5 (GLOVE) ×6 IMPLANT
GLOVE BIO SURGEON STRL SZ7.5 (GLOVE) ×8 IMPLANT
GLOVE BIOGEL PI IND STRL 6.5 (GLOVE) ×2 IMPLANT
GLOVE BIOGEL PI IND STRL 7.5 (GLOVE) ×2 IMPLANT
GOWN STRL REUS W/ TWL LRG LVL3 (GOWN DISPOSABLE) ×4 IMPLANT
GOWN STRL REUS W/TWL LRG LVL3 (GOWN DISPOSABLE) ×2
KIT BASIN OR (CUSTOM PROCEDURE TRAY) ×2 IMPLANT
KIT TURNOVER KIT B (KITS) ×2 IMPLANT
MANIFOLD NEPTUNE II (INSTRUMENTS) ×2 IMPLANT
NDL 22X1.5 STRL (OR ONLY) (MISCELLANEOUS) IMPLANT
NEEDLE 22X1.5 STRL (OR ONLY) (MISCELLANEOUS) IMPLANT
NS IRRIG 1000ML POUR BTL (IV SOLUTION) ×2 IMPLANT
PACK ORTHO EXTREMITY (CUSTOM PROCEDURE TRAY) ×2 IMPLANT
PAD ARMBOARD 7.5X6 YLW CONV (MISCELLANEOUS) ×4 IMPLANT
PADDING CAST COTTON 6X4 STRL (CAST SUPPLIES) ×6 IMPLANT
SPONGE T-LAP 18X18 ~~LOC~~+RFID (SPONGE) ×2 IMPLANT
STAPLER VISISTAT 35W (STAPLE) IMPLANT
STOCKINETTE IMPERVIOUS LG (DRAPES) ×2 IMPLANT
STRIP CLOSURE SKIN 1/2X4 (GAUZE/BANDAGES/DRESSINGS) IMPLANT
SUCTION FRAZIER HANDLE 10FR (MISCELLANEOUS)
SUCTION TUBE FRAZIER 10FR DISP (MISCELLANEOUS) IMPLANT
SUT ETHILON 3 0 PS 1 (SUTURE) IMPLANT
SUT MNCRL AB 3-0 PS2 18 (SUTURE) ×2 IMPLANT
SUT MON AB 2-0 CT1 36 (SUTURE) ×2 IMPLANT
SUT PDS AB 2-0 CT1 27 (SUTURE) IMPLANT
SUT VIC AB 0 CT1 27 (SUTURE)
SUT VIC AB 0 CT1 27XBRD ANBCTR (SUTURE) IMPLANT
SUT VIC AB 2-0 CT1 27 (SUTURE)
SUT VIC AB 2-0 CT1 TAPERPNT 27 (SUTURE) IMPLANT
SYR CONTROL 10ML LL (SYRINGE) IMPLANT
TOWEL GREEN STERILE (TOWEL DISPOSABLE) ×4 IMPLANT
TOWEL GREEN STERILE FF (TOWEL DISPOSABLE) ×4 IMPLANT
TUBE CONNECTING 12X1/4 (SUCTIONS) ×2 IMPLANT
UNDERPAD 30X36 HEAVY ABSORB (UNDERPADS AND DIAPERS) ×2 IMPLANT
WATER STERILE IRR 1000ML POUR (IV SOLUTION) ×4 IMPLANT
YANKAUER SUCT BULB TIP NO VENT (SUCTIONS) ×2 IMPLANT

## 2022-09-19 NOTE — Interval H&P Note (Signed)
History and Physical Interval Note:  09/19/2022 8:15 AM  Julia Murray  has presented today for surgery, with the diagnosis of Painful orthopaedic hardware.  The various methods of treatment have been discussed with the patient and family. After consideration of risks, benefits and other options for treatment, the patient has consented to  Procedure(s): HARDWARE REMOVAL DISTAL FEMUR INTERLOCK (Left) as a surgical intervention.  The patient's history has been reviewed, patient examined, no change in status, stable for surgery.  I have reviewed the patient's chart and labs.  Questions were answered to the patient's satisfaction.     Julia Murray P Julia Murray

## 2022-09-19 NOTE — Anesthesia Postprocedure Evaluation (Signed)
Anesthesia Post Note  Patient: Julia Murray  Procedure(s) Performed: HARDWARE REMOVAL DISTAL FEMUR INTERLOCK (Left)     Patient location during evaluation: PACU Anesthesia Type: General Level of consciousness: awake and alert Pain management: pain level controlled Vital Signs Assessment: post-procedure vital signs reviewed and stable Respiratory status: spontaneous breathing, nonlabored ventilation, respiratory function stable and patient connected to nasal cannula oxygen Cardiovascular status: blood pressure returned to baseline and stable Postop Assessment: no apparent nausea or vomiting Anesthetic complications: no   No notable events documented.  Last Vitals:  Vitals:   09/19/22 0930 09/19/22 0945  BP: 126/79 139/70  Pulse: 63 (!) 54  Resp: 16 18  Temp:  (!) 36.4 C  SpO2: 97% 96%    Last Pain:  Vitals:   09/19/22 0908  TempSrc:   PainSc: 0-No pain                 Earl Lites P Kriya Westra

## 2022-09-19 NOTE — Discharge Instructions (Signed)
Orthopaedic Trauma Service Discharge Instructions   General Discharge Instructions  WEIGHT BEARING STATUS:weightbearing as tolerated  RANGE OF MOTION/ACTIVITY: ok for knee and hip range of motion as tolerated  Wound Care: You may remove your surgical dressing on post-op day 2, (Friday 09/21/22). Incisions can be left open to air if there is no drainage. Once the incision is completely dry and without drainage, it may be left open to air out.  Showering may begin post-op day 3, (Saturday 09/22/22).  Clean incision gently with soap and water.  DVT/PE prophylaxis: None  Diet: as you were eating previously.  Can use over the counter stool softeners and bowel preparations, such as Miralax, to help with bowel movements.  Narcotics can be constipating.  Be sure to drink plenty of fluids  PAIN MEDICATION USE AND EXPECTATIONS  You have likely been given narcotic medications to help control your pain.  After a traumatic event that results in an fracture (broken bone) with or without surgery, it is ok to use narcotic pain medications to help control one's pain.  We understand that everyone responds to pain differently and each individual patient will be evaluated on a regular basis for the continued need for narcotic medications. Ideally, narcotic medication use should last no more than 6-8 weeks (coinciding with fracture healing).   As a patient it is your responsibility as well to monitor narcotic medication use and report the amount and frequency you use these medications when you come to your office visit.   We would also advise that if you are using narcotic medications, you should take a dose prior to therapy to maximize you participation.  IF YOU ARE ON NARCOTIC MEDICATIONS IT IS NOT PERMISSIBLE TO OPERATE A MOTOR VEHICLE (MOTORCYCLE/CAR/TRUCK/MOPED) OR HEAVY MACHINERY DO NOT MIX NARCOTICS WITH OTHER CNS (CENTRAL NERVOUS SYSTEM) DEPRESSANTS SUCH AS ALCOHOL   STOP SMOKING OR USING NICOTINE  PRODUCTS!!!!  As discussed nicotine severely impairs your body's ability to heal surgical and traumatic wounds but also impairs bone healing.  Wounds and bone heal by forming microscopic blood vessels (angiogenesis) and nicotine is a vasoconstrictor (essentially, shrinks blood vessels).  Therefore, if vasoconstriction occurs to these microscopic blood vessels they essentially disappear and are unable to deliver necessary nutrients to the healing tissue.  This is one modifiable factor that you can do to dramatically increase your chances of healing your injury.    (This means no smoking, no nicotine gum, patches, etc)  DO NOT USE NONSTEROIDAL ANTI-INFLAMMATORY DRUGS (NSAID'S)  Using products such as Advil (ibuprofen), Aleve (naproxen), Motrin (ibuprofen) for additional pain control during fracture healing can delay and/or prevent the healing response.  If you would like to take over the counter (OTC) medication, Tylenol (acetaminophen) is ok.  However, some narcotic medications that are given for pain control contain acetaminophen as well. Therefore, you should not exceed more than 4000 mg of tylenol in a day if you do not have liver disease.  Also note that there are may OTC medicines, such as cold medicines and allergy medicines that my contain tylenol as well.  If you have any questions about medications and/or interactions please ask your doctor/PA or your pharmacist.      ICE AND ELEVATE INJURED/OPERATIVE EXTREMITY  Using ice and elevating the injured extremity above your heart can help with swelling and pain control.  Icing in a pulsatile fashion, such as 20 minutes on and 20 minutes off, can be followed.    Do not place ice directly on  skin. Make sure there is a barrier between to skin and the ice pack.    Using frozen items such as frozen peas works well as the conform nicely to the are that needs to be iced.  USE AN ACE WRAP OR TED HOSE FOR SWELLING CONTROL  In addition to icing and elevation,  Ace wraps or TED hose are used to help limit and resolve swelling.  It is recommended to use Ace wraps or TED hose until you are informed to stop.    When using Ace Wraps start the wrapping distally (farthest away from the body) and wrap proximally (closer to the body)   Example: If you had surgery on your leg or thing and you do not have a splint on, start the ace wrap at the toes and work your way up to the thigh        If you had surgery on your upper extremity and do not have a splint on, start the ace wrap at your fingers and work your way up to the upper arm   CALL THE OFFICE WITH ANY QUESTIONS OR CONCERNS: [phone]   VISIT OUR WEBSITE FOR ADDITIONAL INFORMATION: [email or web address]    Discharge Wound Care Instructions  Do NOT apply any ointments, solutions or lotions to pin sites or surgical wounds.  These prevent needed drainage and even though solutions like hydrogen peroxide kill bacteria, they also damage cells lining the pin sites that help fight infection.  Applying lotions or ointments can keep the wounds moist and can cause them to breakdown and open up as well. This can increase the risk for infection. When in doubt call the office.   If any drainage is noted, use one layer of adaptic or Mepitel, then gauze, Kerlix, and an ace wrap. - These dressing supplies should be available at local medical supply stores [facility], [facility], etc) as well as Insurance claims handler ([facility], [facility], [facility], etc)  Once the incision is completely dry and without drainage, it may be left open to air out.  Showering may begin 36-48 hours later.  Cleaning gently with soap and water.

## 2022-09-19 NOTE — Transfer of Care (Signed)
Immediate Anesthesia Transfer of Care Note  Patient: Julia Murray  Procedure(s) Performed: HARDWARE REMOVAL DISTAL FEMUR INTERLOCK (Left)  Patient Location: PACU  Anesthesia Type:General  Level of Consciousness: awake, alert , and oriented  Airway & Oxygen Therapy: Patient Spontanous Breathing  Post-op Assessment: Report given to RN and Post -op Vital signs reviewed and stable  Post vital signs: Reviewed and stable  Last Vitals:  Vitals Value Taken Time  BP 123/78 09/19/22 0908  Temp    Pulse 75 09/19/22 0909  Resp 21 09/19/22 0909  SpO2 96 % 09/19/22 0909  Vitals shown include unvalidated device data.  Last Pain:  Vitals:   09/19/22 0720  TempSrc:   PainSc: 0-No pain      Patients Stated Pain Goal: 0 (09/19/22 0720)  Complications: No notable events documented.

## 2022-09-19 NOTE — Anesthesia Procedure Notes (Signed)
Procedure Name: LMA Insertion Date/Time: 09/19/2022 8:33 AM  Performed by: Gwenyth Allegra, CRNAPre-anesthesia Checklist: Patient identified, Emergency Drugs available, Suction available, Patient being monitored and Timeout performed Patient Re-evaluated:Patient Re-evaluated prior to induction Oxygen Delivery Method: Circle system utilized Preoxygenation: Pre-oxygenation with 100% oxygen Induction Type: IV induction LMA: LMA inserted Tube secured with: Tape

## 2022-09-19 NOTE — Op Note (Signed)
Orthopaedic Surgery Operative Note (CSN: [id number] ) Date of Surgery: 09/19/2022  Admit Date: 09/19/2022   Diagnoses: Pre-Op Diagnoses: Failed distal interlocking screw fixation left femur Healed left proximal femur fracture  Post-Op Diagnosis: Same  Procedures: CPT 20680-Removal of hardware left femur  Surgeons : Primary: Roby Lofts, MD  Assistant: None  Location: Or 3   Anesthesia: General  Antibiotics: Ancef 2g preop   Tourniquet time: None    Estimated Blood Loss: Minimal  Complications:None   Specimens:None   Implants: Implant Name Type Inv. Item Serial No. Manufacturer Lot No. LRB No. Used Action  SCREW TRIGEN LOW PROF [id number] - [id number] Screw SCREW TRIGEN LOW PROF [id number]  [employer] [id number] Left 1 Explanted     Indications for Surgery: 67 year old female who sustained a left proximal femur fracture and underwent intramedullary nailing in January 2024.  She subsequently developed increased amount of pain with loss of distal interlocking fixation.  As result I then proceeded with exchange interlocking distally.  She returned to the office after this procedure with again failed fixation of the distal interlocking screw.  We decided to leave this in place until she fully healed her proximal femur fracture.  We then decided to proceed with the removal of distal locking screw.  Risk and benefits were discussed with the patient.  She agreed to proceed with surgery and consent was obtained.  Operative Findings: Successful removal of distal interlocking screw without complication.  Procedure: The patient was identified in the preoperative holding area. Consent was confirmed with the patient and their family and all questions were answered. The operative extremity was marked after confirmation with the patient. she was then brought back to the operating room by our anesthesia colleagues.  She was carefully transferred over to radiolucent flattop  table.  She was placed under general anesthetic.  Left lower extremity was then prepped and draped in usual sterile fashion.  A timeout was performed to verify the patient, the procedure, and the extremity.  Preoperative antibiotics were dosed.  Fluoroscopic imaging showed the loss of fixation of the distal interlocking screw.  I made a small incision through previous scar and I was able to identify and placed the screwdriver in the screw head.  I was then able to backed this out without difficulty.  Final fluoroscopic imaging showed that no retained hardware was in place.  The incision was irrigated and closed with 3-0 Monocryl and Steri-Strips.  Sterile dressing was applied.  The patient was then awoken from anesthesia and taken to the PACU in stable condition.  Post Op Plan/Instructions: Patient will be weightbearing as tolerated to the left lower extremity.  No DVT prophylaxis is needed.  She will discharge home from the PACU.  I was present and performed the entire surgery.  Ulyses Southward, PA-C did assist me throughout the case. An assistant was necessary given the difficulty in approach, maintenance of reduction and ability to instrument the fracture.   Truitt Merle, MD [facility]

## 2022-09-20 ENCOUNTER — Encounter (HOSPITAL_COMMUNITY): Payer: Self-pay | Admitting: Student

## 2022-10-03 DIAGNOSIS — M25552 Pain in left hip: Secondary | ICD-10-CM | POA: Diagnosis not present

## 2022-10-05 DIAGNOSIS — M25552 Pain in left hip: Secondary | ICD-10-CM | POA: Diagnosis not present

## 2022-10-08 DIAGNOSIS — M25552 Pain in left hip: Secondary | ICD-10-CM | POA: Diagnosis not present

## 2022-10-17 DIAGNOSIS — M25552 Pain in left hip: Secondary | ICD-10-CM | POA: Diagnosis not present

## 2022-10-18 DIAGNOSIS — K219 Gastro-esophageal reflux disease without esophagitis: Secondary | ICD-10-CM | POA: Diagnosis not present

## 2022-10-18 DIAGNOSIS — Z0000 Encounter for general adult medical examination without abnormal findings: Secondary | ICD-10-CM | POA: Diagnosis not present

## 2022-10-18 DIAGNOSIS — M1711 Unilateral primary osteoarthritis, right knee: Secondary | ICD-10-CM | POA: Diagnosis not present

## 2022-10-18 DIAGNOSIS — E559 Vitamin D deficiency, unspecified: Secondary | ICD-10-CM | POA: Diagnosis not present

## 2022-10-18 DIAGNOSIS — E785 Hyperlipidemia, unspecified: Secondary | ICD-10-CM | POA: Diagnosis not present

## 2022-10-19 DIAGNOSIS — M25552 Pain in left hip: Secondary | ICD-10-CM | POA: Diagnosis not present

## 2022-10-23 DIAGNOSIS — T8484XD Pain due to internal orthopedic prosthetic devices, implants and grafts, subsequent encounter: Secondary | ICD-10-CM | POA: Diagnosis not present

## 2022-10-23 DIAGNOSIS — S7222XD Displaced subtrochanteric fracture of left femur, subsequent encounter for closed fracture with routine healing: Secondary | ICD-10-CM | POA: Diagnosis not present

## 2022-10-31 DIAGNOSIS — M25552 Pain in left hip: Secondary | ICD-10-CM | POA: Diagnosis not present

## 2022-11-14 DIAGNOSIS — M25552 Pain in left hip: Secondary | ICD-10-CM | POA: Diagnosis not present

## 2023-01-18 DIAGNOSIS — R92323 Mammographic fibroglandular density, bilateral breasts: Secondary | ICD-10-CM | POA: Diagnosis not present

## 2023-01-18 DIAGNOSIS — Z1231 Encounter for screening mammogram for malignant neoplasm of breast: Secondary | ICD-10-CM | POA: Diagnosis not present

## 2023-01-21 DIAGNOSIS — R109 Unspecified abdominal pain: Secondary | ICD-10-CM | POA: Diagnosis not present

## 2023-01-21 DIAGNOSIS — R350 Frequency of micturition: Secondary | ICD-10-CM | POA: Diagnosis not present

## 2023-04-19 DIAGNOSIS — M25561 Pain in right knee: Secondary | ICD-10-CM | POA: Diagnosis not present

## 2023-06-19 ENCOUNTER — Ambulatory Visit: Payer: Self-pay | Admitting: Orthopedic Surgery

## 2023-07-24 NOTE — Patient Instructions (Signed)
 SURGICAL WAITING ROOM VISITATION  Patients having surgery or a procedure may have no more than 2 support people in the waiting area - these visitors may rotate.    Children under the age of 49 must have an adult with them who is not the patient.  Due to an increase in RSV and influenza rates and associated hospitalizations, children ages 62 and under may not visit patients in [facility] hospitals.  Visitors with respiratory illnesses are discouraged from visiting and should remain at home.  If the patient needs to stay at the hospital during part of their recovery, the visitor guidelines for inpatient rooms apply. Pre-op nurse will coordinate an appropriate time for 1 support person to accompany patient in pre-op.  This support person may not rotate.    Please refer to the [facility] website for the visitor guidelines for Inpatients (after your surgery is over and you are in a regular room).       Your procedure is scheduled on:  08/07/2023    Report to [facility] Main Entrance    Report to admitting at   0600AM   Call this number if you have problems the morning of surgery [phone]   Do not eat food :After Midnight.   After Midnight you may have the following liquids until _ 0530_____ AM  DAY OF SURGERY  Water Non-Citrus Juices (without pulp, NO RED-Apple, White grape, White cranberry) Black Coffee (NO MILK/CREAM OR CREAMERS, sugar ok)  Clear Tea (NO MILK/CREAM OR CREAMERS, sugar ok) regular and decaf                             Plain Jell-O (NO RED)                                           Fruit ices (not with fruit pulp, NO RED)                                     Popsicles (NO RED)                                                               Sports drinks like Gatorade (NO RED)              Drink 2 Ensure/G2 drinks AT 10:00 PM the night before surgery.        The day of surgery:  Drink ONE (1) Pre-Surgery Clear Ensure or G2 at  0530AM ( have completed by  )  the morning of surgery. Drink in one sitting. Do not sip.  This drink was given to you during your hospital  pre-op appointment visit. Nothing else to drink after completing the  Pre-Surgery Clear Ensure or G2.          If you have questions, please contact your surgeon's office.        Oral Hygiene is also important to reduce your risk of infection.  Remember - BRUSH YOUR TEETH THE MORNING OF SURGERY WITH YOUR REGULAR TOOTHPASTE  DENTURES WILL BE REMOVED PRIOR TO SURGERY PLEASE DO NOT APPLY "Poly grip" OR ADHESIVES!!!   Do NOT smoke after Midnight   Stop all vitamins and herbal supplements 7 days before surgery.   Take these medicines the morning of surgery with A SIP OF WATER:  flonase , omeprazole   DO NOT TAKE ANY ORAL DIABETIC MEDICATIONS DAY OF YOUR SURGERY  Bring CPAP mask and tubing day of surgery.                              You may not have any metal on your body including hair pins, jewelry, and body piercing             Do not wear make-up, lotions, powders, perfumes/cologne, or deodorant  Do not wear nail polish including gel and S&S, artificial/acrylic nails, or any other type of covering on natural nails including finger and toenails. If you have artificial nails, gel coating, etc. that needs to be removed by a nail salon please have this removed prior to surgery or surgery may need to be canceled/ delayed if the surgeon/ anesthesia feels like they are unable to be safely monitored.   Do not shave  48 hours prior to surgery.               Men may shave face and neck.   Do not bring valuables to the hospital. [place] IS NOT             RESPONSIBLE   FOR VALUABLES.   Contacts, glasses, dentures or bridgework may not be worn into surgery.   Bring small overnight bag day of surgery.   DO NOT BRING YOUR HOME MEDICATIONS TO THE HOSPITAL. PHARMACY WILL DISPENSE MEDICATIONS LISTED ON YOUR MEDICATION LIST TO YOU DURING YOUR  ADMISSION IN THE HOSPITAL!    Patients discharged on the day of surgery will not be allowed to drive home.  Someone NEEDS to stay with you for the first 24 hours after anesthesia.   Special Instructions: Bring a copy of your healthcare power of attorney and living will documents the day of surgery if you haven't scanned them before.              Please read over the following fact sheets you were given: IF YOU HAVE QUESTIONS ABOUT YOUR PRE-OP INSTRUCTIONS PLEASE CALL [phone]   If you received a COVID test during your pre-op visit  it is requested that you wear a mask when out in public, stay away from anyone that may not be feeling well and notify your surgeon if you develop symptoms. If you test positive for Covid or have been in contact with anyone that has tested positive in the last 10 days please notify you surgeon.   Pre-operative 5 CHG Bath Instructions   You can play a key role in reducing the risk of infection after surgery. Your skin needs to be as free of germs as possible. You can reduce the number of germs on your skin by washing with CHG (chlorhexidine gluconate) soap before surgery. CHG is an antiseptic soap that kills germs and continues to kill germs even after washing.   DO NOT use if you have an allergy to chlorhexidine/CHG or antibacterial soaps. If your skin becomes reddened or irritated, stop using the CHG and notify one of our RNs at [phone].  Please shower with the CHG soap starting 4 days before surgery using the following schedule:     Please keep in mind the following:  DO NOT shave, including legs and underarms, starting the day of your first shower.   You may shave your face at any point before/day of surgery.  Place clean sheets on your bed the day you start using CHG soap. Use a clean washcloth (not used since being washed) for each shower. DO NOT sleep with pets once you start using the CHG.   CHG Shower Instructions:  If you choose to wash  your hair and private area, wash first with your normal shampoo/soap.  After you use shampoo/soap, rinse your hair and body thoroughly to remove shampoo/soap residue.  Turn the water OFF and apply about 3 tablespoons (45 ml) of CHG soap to a CLEAN washcloth.  Apply CHG soap ONLY FROM YOUR NECK DOWN TO YOUR TOES (washing for 3-5 minutes)  DO NOT use CHG soap on face, private areas, open wounds, or sores.  Pay special attention to the area where your surgery is being performed.  If you are having back surgery, having someone wash your back for you may be helpful. Wait 2 minutes after CHG soap is applied, then you may rinse off the CHG soap.  Pat dry with a clean towel  Put on clean clothes/pajamas   If you choose to wear lotion, please use ONLY the CHG-compatible lotions on the back of this paper.     Additional instructions for the day of surgery: DO NOT APPLY any lotions, deodorants, cologne, or perfumes.   Put on clean/comfortable clothes.  Brush your teeth.  Ask your nurse before applying any prescription medications to the skin.      CHG Compatible Lotions   Aveeno Moisturizing lotion  Cetaphil Moisturizing Cream  Cetaphil Moisturizing Lotion  Clairol Herbal Essence Moisturizing Lotion, Dry Skin  Clairol Herbal Essence Moisturizing Lotion, Extra Dry Skin  Clairol Herbal Essence Moisturizing Lotion, Normal Skin  Curel Age Defying Therapeutic Moisturizing Lotion with Alpha Hydroxy  Curel Extreme Care Body Lotion  Curel Soothing Hands Moisturizing Hand Lotion  Curel Therapeutic Moisturizing Cream, Fragrance-Free  Curel Therapeutic Moisturizing Lotion, Fragrance-Free  Curel Therapeutic Moisturizing Lotion, Original Formula  Eucerin Daily Replenishing Lotion  Eucerin Dry Skin Therapy Plus Alpha Hydroxy Crme  Eucerin Dry Skin Therapy Plus Alpha Hydroxy Lotion  Eucerin Original Crme  Eucerin Original Lotion  Eucerin Plus Crme Eucerin Plus Lotion  Eucerin TriLipid Replenishing  Lotion  Keri Anti-Bacterial Hand Lotion  Keri Deep Conditioning Original Lotion Dry Skin Formula Softly Scented  Keri Deep Conditioning Original Lotion, Fragrance Free Sensitive Skin Formula  Keri Lotion Fast Absorbing Fragrance Free Sensitive Skin Formula  Keri Lotion Fast Absorbing Softly Scented Dry Skin Formula  Keri Original Lotion  Keri Skin Renewal Lotion Keri Silky Smooth Lotion  Keri Silky Smooth Sensitive Skin Lotion  Nivea Body Creamy Conditioning Oil  Nivea Body Extra Enriched Teacher, adult education Moisturizing Lotion Nivea Crme  Nivea Skin Firming Lotion  NutraDerm 30 Skin Lotion  NutraDerm Skin Lotion  NutraDerm Therapeutic Skin Cream  NutraDerm Therapeutic Skin Lotion  ProShield Protective Hand Cream  Provon moisturizing lotion

## 2023-07-29 ENCOUNTER — Encounter (HOSPITAL_COMMUNITY)
Admission: RE | Admit: 2023-07-29 | Discharge: 2023-07-29 | Disposition: A | Discharge status: Home / Self Care | Payer: Medicare Other | Source: Ambulatory Visit | Attending: Specialist | Admitting: Specialist

## 2023-07-29 ENCOUNTER — Other Ambulatory Visit: Payer: Self-pay

## 2023-07-29 ENCOUNTER — Encounter (HOSPITAL_COMMUNITY): Payer: Self-pay

## 2023-07-29 VITALS — BP 129/92 | HR 77 | Temp 98.0°F | Resp 16 | Ht 64.5 in | Wt 172.0 lb

## 2023-07-29 DIAGNOSIS — Z01812 Encounter for preprocedural laboratory examination: Secondary | ICD-10-CM | POA: Diagnosis present

## 2023-07-29 DIAGNOSIS — Z01818 Encounter for other preprocedural examination: Secondary | ICD-10-CM

## 2023-07-29 HISTORY — DX: Other specified postprocedural states: R11.2

## 2023-07-29 HISTORY — DX: Nausea with vomiting, unspecified: Z98.890

## 2023-07-29 LAB — CBC
HCT: 43.1 % (ref 36.0–46.0)
Hemoglobin: 13.3 g/dL (ref 12.0–15.0)
MCH: 30 pg (ref 26.0–34.0)
MCHC: 30.9 g/dL (ref 30.0–36.0)
MCV: 97.1 fL (ref 80.0–100.0)
Platelets: 282 10*3/uL (ref 150–400)
RBC: 4.44 MIL/uL (ref 3.87–5.11)
RDW: 14 % (ref 11.5–15.5)
WBC: 5.9 10*3/uL (ref 4.0–10.5)
nRBC: 0 % (ref 0.0–0.2)

## 2023-07-29 LAB — SURGICAL PCR SCREEN
MRSA, PCR: NEGATIVE
Staphylococcus aureus: NEGATIVE

## 2023-07-29 NOTE — Progress Notes (Addendum)
 Anesthesia Review:  PCP: Sharon Seller- Preop visit on 06/10/23  Cardiologist : none   PPM/ ICD: Device Orders: Rep Notified:  Chest x-ray : EKG : Echo : Stress test: Cardiac Cath :   Activity level: can do a flight of stairs without difficulty  Sleep Study/ CPAP : none  Fasting Blood Sugar :      / Checks Blood Sugar -- times a day:    Blood Thinner/ Instructions /Last Dose: ASA / Instructions/ Last Dose :

## 2023-08-05 ENCOUNTER — Ambulatory Visit: Payer: Self-pay | Admitting: Orthopedic Surgery

## 2023-08-05 NOTE — H&P (Signed)
 Julia Murray is an 68 y.o. female.   Chief Complaint: right knee pain HPI: Julia Murray is here today for her history and physical. She is scheduled on 08/07/23 for a right total knee arthroplasty at [facility].  Dr. Shelle Iron and the patient mutually agreed to proceed with a total knee replacement. Risks and benefits of the procedure were discussed including stiffness, suboptimal range of motion, persistent pain, infection requiring removal of prosthesis and reinsertion, need for prophylactic antibiotics in the future, for example, dental procedures, possible need for manipulation, revision in the future and also anesthetic complications including DVT, PE, etc. We discussed the perioperative course, time in the hospital, postoperative recovery and the need for elevation to control swelling. We also discussed the predicted range of motion and the probability that squatting and kneeling would be unobtainable in the future. In addition, postoperative anticoagulation was discussed. We have obtained preoperative medical clearance as necessary. Provided illustrated handout and discussed it in detail. They will enroll in the total joint replacement educational forum at the hospital.  Preop scheduled at [facility] next week. She is currently taking meloxicam, only aspirin occasionally. Reports last time she recalls pain control issues postop. She plans to do outpatient PT in [facility] near her home.  Past Medical History:  Diagnosis Date   Arthritis    knee, back   COVID-19    2020   GERD (gastroesophageal reflux disease)    Hyperlipidemia    PONV (postoperative nausea and vomiting)     Past Surgical History:  Procedure Laterality Date   ANTERIOR CRUCIATE LIGAMENT REPAIR     left   AUGMENTATION MAMMAPLASTY Bilateral 2021   re-do   BREAST BIOPSY Bilateral    multiple times per patient   BREAST SURGERY     mutiple bx   COLONOSCOPY  04/04/2008   at [facility] done   HARDWARE REMOVAL Left  06/12/2022   Procedure: EXCHANGE DISTAL FEMUR INTERLOCKING SCREWS;  Surgeon: Roby Lofts, MD;  Location: [facility] OR;  Service: Orthopedics;  Laterality: Left;   HARDWARE REMOVAL Left 09/19/2022   Procedure: HARDWARE REMOVAL DISTAL FEMUR INTERLOCK;  Surgeon: Roby Lofts, MD;  Location: [facility] OR;  Service: Orthopedics;  Laterality: Left;   INTRAMEDULLARY (IM) NAIL INTERTROCHANTERIC Left 05/27/2022   Procedure: INTRAMEDULLARY (IM) NAIL INTERTROCHANTERIC;  Surgeon: Roby Lofts, MD;  Location: [facility] OR;  Service: Orthopedics;  Laterality: Left;   KNEE ARTHROSCOPY  05/20/2012   Procedure: ARTHROSCOPY KNEE;  Surgeon: Javier Docker, MD;  Location: [facility];  Service: Orthopedics;  Laterality: Right;  right knee scope with incision and drainage and removal of loose foreign body    KNEE ARTHROSCOPY WITH MEDIAL MENISECTOMY  04/25/2012   Procedure: KNEE ARTHROSCOPY WITH MEDIAL MENISECTOMY;  Surgeon: Javier Docker, MD;  Location: [facility];  Service: Orthopedics;  Laterality: Right;  Right Knee Arthrscopy with Partial Medial and Lateral Menisectomy with Removal of Loose Bodies   TONSILLECTOMY     TOTAL KNEE ARTHROPLASTY Left 04/28/2021   Procedure: TOTAL KNEE ARTHROPLASTY, REMOVAL OF HARDWARE (ACL SCREWS);  Surgeon: Jene Every, MD;  Location: [facility];  Service: Orthopedics;  Laterality: Left;    Family History  Problem Relation Age of Onset   Heart disease Father    Heart attack Father    Diabetes Mother    Colon cancer Neg Hx    Esophageal cancer Neg Hx    Rectal cancer Neg Hx    Stomach cancer Neg  Hx    Colon polyps Neg Hx    Social History:  reports that she has never smoked. She has never used smokeless tobacco. She reports current alcohol use of about 8.0 standard drinks of alcohol per week. She reports that she does not use drugs.  Allergies:  Allergies  Allergen Reactions   Shellfish Allergy Hives and Swelling   Bee Venom Swelling   Crab Extract  Hives and Swelling   Current meds: aspirin 81 mg tablet,delayed release Colace 100 mg capsule fluticasone propionate 50 mcg/actuation nasal spray,suspension HYDROcodone 5 mg-acetaminophen 325 mg tablet meloxicam 15 mg tablet omeprazole 20 mg capsule,delayed release predniSONE 5 mg tablets in a dose pack valACYclovir 500 mg tablet Vitamin D  Review of Systems  Constitutional: Negative.   HENT: Negative.    Eyes: Negative.   Respiratory: Negative.    Cardiovascular: Negative.   Gastrointestinal: Negative.   Endocrine: Negative.   Genitourinary: Negative.   Musculoskeletal:  Positive for arthralgias, gait problem, joint swelling and myalgias.  Skin: Negative.   Psychiatric/Behavioral: Negative.      There were no vitals taken for this visit. Physical Exam Constitutional:      Appearance: Normal appearance.  HENT:     Head: Normocephalic and atraumatic.     Right Ear: External ear normal.     Left Ear: External ear normal.     Nose: Nose normal.     Mouth/Throat:     Pharynx: Oropharynx is clear.  Eyes:     Conjunctiva/sclera: Conjunctivae normal.  Cardiovascular:     Rate and Rhythm: Normal rate and regular rhythm.     Pulses: Normal pulses.     Heart sounds: Normal heart sounds.  Pulmonary:     Effort: Pulmonary effort is normal.     Breath sounds: Normal breath sounds.  Abdominal:     General: Bowel sounds are normal.  Musculoskeletal:     Cervical back: Normal range of motion and neck supple.     Comments: Right knee tender medial joint line mild effusion patellofemoral pain compression ranges 0-100. Slight valgus deformity  Neurological:     Mental Status: She is alert.     X-rays of the knee 4 view demonstrate a valgus knee with essentially end-stage osteoarthrosis lateral compartment right knee  Assessment/Plan Impression: Right knee end-stage osteoarthritis  Plan: Pt with end-stage right knee DJD, bone-on-bone, refractory to conservative tx, scheduled  for right total knee replacement by Dr. Shelle Iron on April 2. We again discussed the procedure itself as well as risks, complications and alternatives, including but not limited to DVT, PE, infx, bleeding, failure of procedure, need for secondary procedure including manipulation, nerve injury, ongoing pain/symptoms, anesthesia risk, even stroke or death. Also discussed typical post-op protocols, activity restrictions, need for PT, flexion/extension exercises, time out of work. Discussed need for DVT ppx post-op per protocol. Discussed dental ppx and infx prevention. Also discussed limitations post-operatively such as kneeling and squatting. All questions were answered. Patient desires to proceed with surgery as scheduled.  Will hold supplements, ASA and NSAIDs accordingly. Will remain NPO after midnight the night before surgery. Will present to [facility] for pre-op testing. Anticipate hospital stay to include at least 2 midnights given medical history and to ensure proper pain control. Plan aspirin for DVT ppx post-op. Plan oxycodone or Dilaudid, Robaxin, Colace, Miralax, possibly gabapentin to help with pain control. Plan outpatient PT at [facility] PT postop, she did well with them last time and they are near her home, order  has been sent. Will follow up 10-14 days post-op for suture removal and xrays.  Plan Right total knee replacement  Dorothy Spark, PA-C for Dr Shelle Iron 08/05/2023, 12:49 PM

## 2023-08-05 NOTE — H&P (View-Only) (Signed)
 Julia Murray is an 68 y.o. female.   Chief Complaint: right knee pain HPI: Julia Murray is here today for her history and physical. She is scheduled on 08/07/23 for a right total knee arthroplasty at [facility].  Dr. Shelle Iron and the patient mutually agreed to proceed with a total knee replacement. Risks and benefits of the procedure were discussed including stiffness, suboptimal range of motion, persistent pain, infection requiring removal of prosthesis and reinsertion, need for prophylactic antibiotics in the future, for example, dental procedures, possible need for manipulation, revision in the future and also anesthetic complications including DVT, PE, etc. We discussed the perioperative course, time in the hospital, postoperative recovery and the need for elevation to control swelling. We also discussed the predicted range of motion and the probability that squatting and kneeling would be unobtainable in the future. In addition, postoperative anticoagulation was discussed. We have obtained preoperative medical clearance as necessary. Provided illustrated handout and discussed it in detail. They will enroll in the total joint replacement educational forum at the hospital.  Preop scheduled at [facility] next week. She is currently taking meloxicam, only aspirin occasionally. Reports last time she recalls pain control issues postop. She plans to do outpatient PT in [facility] near her home.  Past Medical History:  Diagnosis Date   Arthritis    knee, back   COVID-19    2020   GERD (gastroesophageal reflux disease)    Hyperlipidemia    PONV (postoperative nausea and vomiting)     Past Surgical History:  Procedure Laterality Date   ANTERIOR CRUCIATE LIGAMENT REPAIR     left   AUGMENTATION MAMMAPLASTY Bilateral 2021   re-do   BREAST BIOPSY Bilateral    multiple times per patient   BREAST SURGERY     mutiple bx   COLONOSCOPY  04/04/2008   at [facility] done   HARDWARE REMOVAL Left  06/12/2022   Procedure: EXCHANGE DISTAL FEMUR INTERLOCKING SCREWS;  Surgeon: Roby Lofts, MD;  Location: [facility];  Service: Orthopedics;  Laterality: Left;   HARDWARE REMOVAL Left 09/19/2022   Procedure: HARDWARE REMOVAL DISTAL FEMUR INTERLOCK;  Surgeon: Roby Lofts, MD;  Location: [facility];  Service: Orthopedics;  Laterality: Left;   INTRAMEDULLARY (IM) NAIL INTERTROCHANTERIC Left 05/27/2022   Procedure: INTRAMEDULLARY (IM) NAIL INTERTROCHANTERIC;  Surgeon: Roby Lofts, MD;  Location: [facility];  Service: Orthopedics;  Laterality: Left;   KNEE ARTHROSCOPY  05/20/2012   Procedure: ARTHROSCOPY KNEE;  Surgeon: Javier Docker, MD;  Location: [facility];  Service: Orthopedics;  Laterality: Right;  right knee scope with incision and drainage and removal of loose foreign body    KNEE ARTHROSCOPY WITH MEDIAL MENISECTOMY  04/25/2012   Procedure: KNEE ARTHROSCOPY WITH MEDIAL MENISECTOMY;  Surgeon: Javier Docker, MD;  Location: [facility];  Service: Orthopedics;  Laterality: Right;  Right Knee Arthrscopy with Partial Medial and Lateral Menisectomy with Removal of Loose Bodies   TONSILLECTOMY     TOTAL KNEE ARTHROPLASTY Left 04/28/2021   Procedure: TOTAL KNEE ARTHROPLASTY, REMOVAL OF HARDWARE (ACL SCREWS);  Surgeon: Jene Every, MD;  Location: [facility];  Service: Orthopedics;  Laterality: Left;    Family History  Problem Relation Age of Onset   Heart disease Father    Heart attack Father    Diabetes Mother    Colon cancer Neg Hx    Esophageal cancer Neg Hx    Rectal cancer Neg Hx    Stomach cancer Neg  Hx    Colon polyps Neg Hx    Social History:  reports that she has never smoked. She has never used smokeless tobacco. She reports current alcohol use of about 8.0 standard drinks of alcohol per week. She reports that she does not use drugs.  Allergies:  Allergies  Allergen Reactions   Shellfish Allergy Hives and Swelling   Bee Venom Swelling   Crab Extract  Hives and Swelling   Current meds: aspirin 81 mg tablet,delayed release Colace 100 mg capsule fluticasone propionate 50 mcg/actuation nasal spray,suspension HYDROcodone 5 mg-acetaminophen 325 mg tablet meloxicam 15 mg tablet omeprazole 20 mg capsule,delayed release predniSONE 5 mg tablets in a dose pack valACYclovir 500 mg tablet Vitamin D  Review of Systems  Constitutional: Negative.   HENT: Negative.    Eyes: Negative.   Respiratory: Negative.    Cardiovascular: Negative.   Gastrointestinal: Negative.   Endocrine: Negative.   Genitourinary: Negative.   Musculoskeletal:  Positive for arthralgias, gait problem, joint swelling and myalgias.  Skin: Negative.   Psychiatric/Behavioral: Negative.      There were no vitals taken for this visit. Physical Exam Constitutional:      Appearance: Normal appearance.  HENT:     Head: Normocephalic and atraumatic.     Right Ear: External ear normal.     Left Ear: External ear normal.     Nose: Nose normal.     Mouth/Throat:     Pharynx: Oropharynx is clear.  Eyes:     Conjunctiva/sclera: Conjunctivae normal.  Cardiovascular:     Rate and Rhythm: Normal rate and regular rhythm.     Pulses: Normal pulses.     Heart sounds: Normal heart sounds.  Pulmonary:     Effort: Pulmonary effort is normal.     Breath sounds: Normal breath sounds.  Abdominal:     General: Bowel sounds are normal.  Musculoskeletal:     Cervical back: Normal range of motion and neck supple.     Comments: Right knee tender medial joint line mild effusion patellofemoral pain compression ranges 0-100. Slight valgus deformity  Neurological:     Mental Status: She is alert.     X-rays of the knee 4 view demonstrate a valgus knee with essentially end-stage osteoarthrosis lateral compartment right knee  Assessment/Plan Impression: Right knee end-stage osteoarthritis  Plan: Pt with end-stage right knee DJD, bone-on-bone, refractory to conservative tx, scheduled  for right total knee replacement by Dr. Shelle Iron on April 2. We again discussed the procedure itself as well as risks, complications and alternatives, including but not limited to DVT, PE, infx, bleeding, failure of procedure, need for secondary procedure including manipulation, nerve injury, ongoing pain/symptoms, anesthesia risk, even stroke or death. Also discussed typical post-op protocols, activity restrictions, need for PT, flexion/extension exercises, time out of work. Discussed need for DVT ppx post-op per protocol. Discussed dental ppx and infx prevention. Also discussed limitations post-operatively such as kneeling and squatting. All questions were answered. Patient desires to proceed with surgery as scheduled.  Will hold supplements, ASA and NSAIDs accordingly. Will remain NPO after midnight the night before surgery. Will present to [facility] for pre-op testing. Anticipate hospital stay to include at least 2 midnights given medical history and to ensure proper pain control. Plan aspirin for DVT ppx post-op. Plan oxycodone or Dilaudid, Robaxin, Colace, Miralax, possibly gabapentin to help with pain control. Plan outpatient PT at [facility] PT postop, she did well with them last time and they are near her home, order  has been sent. Will follow up 10-14 days post-op for suture removal and xrays.  Plan Right total knee replacement  Dorothy Spark, PA-C for Dr Shelle Iron 08/05/2023, 12:49 PM

## 2023-08-06 NOTE — Anesthesia Preprocedure Evaluation (Signed)
 Anesthesia Evaluation  Patient identified by MRN, date of birth, ID band Patient awake    Reviewed: Allergy & Precautions, NPO status , Patient's Chart, lab work & pertinent test results  History of Anesthesia Complications (+) PONV and history of anesthetic complications  Airway Mallampati: II  TM Distance: >3 FB Neck ROM: Full    Dental no notable dental hx.    Pulmonary neg pulmonary ROS   Pulmonary exam normal breath sounds clear to auscultation       Cardiovascular negative cardio ROS Normal cardiovascular exam Rhythm:Regular Rate:Normal     Neuro/Psych  PSYCHIATRIC DISORDERS Anxiety     negative neurological ROS     GI/Hepatic Neg liver ROS,GERD  Medicated,,  Endo/Other  negative endocrine ROS    Renal/GU      Musculoskeletal  (+) Arthritis , Osteoarthritis,    Abdominal   Peds  Hematology negative hematology ROS (+)   Anesthesia Other Findings   Reproductive/Obstetrics                              Anesthesia Physical Anesthesia Plan  ASA: 2  Anesthesia Plan: Spinal   Post-op Pain Management: Celebrex PO (pre-op)*, Tylenol PO (pre-op)* and Regional block*   Induction: Intravenous  PONV Risk Score and Plan: 4 or greater and Ondansetron, Dexamethasone, Treatment may vary due to age or medical condition, Propofol infusion and TIVA  Airway Management Planned: Natural Airway  Additional Equipment: None  Intra-op Plan:   Post-operative Plan:   Informed Consent: I have reviewed the patients History and Physical, chart, labs and discussed the procedure including the risks, benefits and alternatives for the proposed anesthesia with the patient or authorized representative who has indicated his/her understanding and acceptance.     Dental advisory given  Plan Discussed with: CRNA  Anesthesia Plan Comments:         Anesthesia Quick Evaluation

## 2023-08-07 ENCOUNTER — Other Ambulatory Visit: Payer: Self-pay

## 2023-08-07 ENCOUNTER — Ambulatory Visit (HOSPITAL_COMMUNITY)

## 2023-08-07 ENCOUNTER — Ambulatory Visit (HOSPITAL_BASED_OUTPATIENT_CLINIC_OR_DEPARTMENT_OTHER): Payer: Self-pay | Admitting: Anesthesiology

## 2023-08-07 ENCOUNTER — Ambulatory Visit (HOSPITAL_COMMUNITY): Payer: Self-pay | Admitting: Physician Assistant

## 2023-08-07 ENCOUNTER — Observation Stay (HOSPITAL_COMMUNITY)
Admission: RE | Admit: 2023-08-07 | Discharge: 2023-08-09 | Disposition: A | Discharge status: Home / Self Care | Payer: Medicare Other | Source: Ambulatory Visit | Attending: Specialist | Admitting: Specialist

## 2023-08-07 ENCOUNTER — Encounter (HOSPITAL_COMMUNITY)
Admission: RE | Disposition: A | Discharge status: Home / Self Care | Payer: Self-pay | Source: Ambulatory Visit | Attending: Specialist

## 2023-08-07 ENCOUNTER — Encounter (HOSPITAL_COMMUNITY): Payer: Self-pay | Admitting: Specialist

## 2023-08-07 DIAGNOSIS — Z8616 Personal history of COVID-19: Secondary | ICD-10-CM | POA: Insufficient documentation

## 2023-08-07 DIAGNOSIS — Z01818 Encounter for other preprocedural examination: Secondary | ICD-10-CM

## 2023-08-07 DIAGNOSIS — M1711 Unilateral primary osteoarthritis, right knee: Secondary | ICD-10-CM | POA: Diagnosis present

## 2023-08-07 DIAGNOSIS — Z79899 Other long term (current) drug therapy: Secondary | ICD-10-CM | POA: Insufficient documentation

## 2023-08-07 DIAGNOSIS — Z7982 Long term (current) use of aspirin: Secondary | ICD-10-CM | POA: Insufficient documentation

## 2023-08-07 DIAGNOSIS — M21161 Varus deformity, not elsewhere classified, right knee: Secondary | ICD-10-CM | POA: Diagnosis not present

## 2023-08-07 DIAGNOSIS — Z96652 Presence of left artificial knee joint: Secondary | ICD-10-CM | POA: Insufficient documentation

## 2023-08-07 HISTORY — PX: TOTAL KNEE ARTHROPLASTY: SHX125

## 2023-08-07 SURGERY — ARTHROPLASTY, KNEE, TOTAL
Anesthesia: Spinal | Site: Knee | Laterality: Right

## 2023-08-07 MED ORDER — SODIUM CHLORIDE (PF) 0.9 % IJ SOLN
INTRAMUSCULAR | Status: DC | PRN
  Administered 2023-08-07: 60 mL

## 2023-08-07 MED ORDER — ONDANSETRON HCL 4 MG/2ML IJ SOLN
4.0000 mg | Freq: Four times a day (QID) | INTRAMUSCULAR | Status: DC | PRN
  Administered 2023-08-07 – 2023-08-08 (×3): 4 mg via INTRAVENOUS
  Filled 2023-08-07 (×2): qty 2

## 2023-08-07 MED ORDER — CEFAZOLIN SODIUM-DEXTROSE 2-4 GM/100ML-% IV SOLN
2.0000 g | INTRAVENOUS | Status: AC
  Administered 2023-08-07: 2 g via INTRAVENOUS
  Filled 2023-08-07: qty 100

## 2023-08-07 MED ORDER — ACETAMINOPHEN 500 MG PO TABS
1000.0000 mg | ORAL_TABLET | Freq: Four times a day (QID) | ORAL | Status: AC
  Administered 2023-08-07 – 2023-08-08 (×4): 1000 mg via ORAL
  Filled 2023-08-07 (×4): qty 2

## 2023-08-07 MED ORDER — MAGNESIUM CITRATE PO SOLN: 1.0000 | Freq: Once | ORAL | Status: DC | PRN

## 2023-08-07 MED ORDER — BUPIVACAINE-EPINEPHRINE 0.25% -1:200000 IJ SOLN
INTRAMUSCULAR | Status: DC | PRN
  Administered 2023-08-07: 30 mL

## 2023-08-07 MED ORDER — DIPHENHYDRAMINE HCL 12.5 MG/5ML PO ELIX
12.5000 mg | ORAL_SOLUTION | ORAL | Status: DC | PRN
  Administered 2023-08-08: 25 mg via ORAL
  Filled 2023-08-07: qty 10

## 2023-08-07 MED ORDER — ONDANSETRON HCL 4 MG PO TABS
4.0000 mg | ORAL_TABLET | Freq: Four times a day (QID) | ORAL | Status: DC | PRN
  Administered 2023-08-09: 4 mg via ORAL
  Filled 2023-08-07: qty 1

## 2023-08-07 MED ORDER — BUPIVACAINE IN DEXTROSE 0.75-8.25 % IT SOLN
INTRATHECAL | Status: DC | PRN
  Administered 2023-08-07: 2 mL via INTRATHECAL

## 2023-08-07 MED ORDER — EPHEDRINE SULFATE-NACL 50-0.9 MG/10ML-% IV SOSY
PREFILLED_SYRINGE | INTRAVENOUS | Status: DC | PRN
  Administered 2023-08-07: 10 mg via INTRAVENOUS

## 2023-08-07 MED ORDER — MENTHOL 3 MG MT LOZG: 1.0000 | LOZENGE | OROMUCOSAL | Status: DC | PRN

## 2023-08-07 MED ORDER — FLUTICASONE PROPIONATE 50 MCG/ACT NA SUSP
2.0000 | Freq: Every day | NASAL | Status: DC
  Administered 2023-08-07 – 2023-08-09 (×3): 2 via NASAL
  Filled 2023-08-07: qty 16

## 2023-08-07 MED ORDER — SODIUM CHLORIDE 0.9 % IR SOLN
Status: DC | PRN
  Administered 2023-08-07: 3000 mL

## 2023-08-07 MED ORDER — HYDROMORPHONE HCL 1 MG/ML IJ SOLN: 0.2500 mg | INTRAMUSCULAR | Status: DC | PRN

## 2023-08-07 MED ORDER — STERILE WATER FOR IRRIGATION IR SOLN
Status: DC | PRN
  Administered 2023-08-07: 1000 mL

## 2023-08-07 MED ORDER — DOCUSATE SODIUM 100 MG PO CAPS
100.0000 mg | ORAL_CAPSULE | Freq: Two times a day (BID) | ORAL | Status: DC
  Administered 2023-08-07 – 2023-08-09 (×4): 100 mg via ORAL
  Filled 2023-08-07 (×4): qty 1

## 2023-08-07 MED ORDER — MIDAZOLAM HCL 5 MG/5ML IJ SOLN
INTRAMUSCULAR | Status: DC | PRN
Start: 2023-08-07 — End: 2023-08-07
  Administered 2023-08-07: 2 mg via INTRAVENOUS

## 2023-08-07 MED ORDER — PROPOFOL 500 MG/50ML IV EMUL: INTRAVENOUS | Status: DC | PRN

## 2023-08-07 MED ORDER — PROPOFOL 500 MG/50ML IV EMUL
INTRAVENOUS | Status: DC | PRN
  Administered 2023-08-07: 50 mg via INTRAVENOUS
  Administered 2023-08-07: 60 ug/kg/min via INTRAVENOUS
  Administered 2023-08-07: 30 mg via INTRAVENOUS
  Administered 2023-08-07: 10 mg via INTRAVENOUS

## 2023-08-07 MED ORDER — EPHEDRINE 5 MG/ML INJ
INTRAVENOUS | Status: AC
  Filled 2023-08-07: qty 5

## 2023-08-07 MED ORDER — RISAQUAD PO CAPS
1.0000 | ORAL_CAPSULE | Freq: Every day | ORAL | Status: DC
  Administered 2023-08-07 – 2023-08-09 (×3): 1 via ORAL
  Filled 2023-08-07 (×3): qty 1

## 2023-08-07 MED ORDER — DROPERIDOL 2.5 MG/ML IJ SOLN: 0.6250 mg | Freq: Once | INTRAMUSCULAR | Status: DC | PRN

## 2023-08-07 MED ORDER — HYDROMORPHONE HCL 2 MG PO TABS
2.0000 mg | ORAL_TABLET | ORAL | Status: DC | PRN
  Administered 2023-08-07 – 2023-08-08 (×3): 2 mg via ORAL
  Administered 2023-08-08: 3 mg via ORAL
  Administered 2023-08-08 – 2023-08-09 (×5): 2 mg via ORAL
  Filled 2023-08-07 (×5): qty 1
  Filled 2023-08-07: qty 2
  Filled 2023-08-07 (×4): qty 1

## 2023-08-07 MED ORDER — OXYCODONE HCL 5 MG PO TABS
10.0000 mg | ORAL_TABLET | ORAL | Status: DC | PRN
  Administered 2023-08-07 – 2023-08-09 (×6): 10 mg via ORAL
  Filled 2023-08-07 (×7): qty 2

## 2023-08-07 MED ORDER — BUPIVACAINE LIPOSOME 1.3 % IJ SUSP
INTRAMUSCULAR | Status: AC
  Filled 2023-08-07: qty 20

## 2023-08-07 MED ORDER — BUPIVACAINE-EPINEPHRINE (PF) 0.25% -1:200000 IJ SOLN
INTRAMUSCULAR | Status: AC
  Filled 2023-08-07: qty 30

## 2023-08-07 MED ORDER — HYDROMORPHONE HCL 1 MG/ML IJ SOLN
0.5000 mg | INTRAMUSCULAR | Status: DC | PRN
Start: 2023-08-07 — End: 2023-08-09
  Administered 2023-08-07 – 2023-08-08 (×3): 1 mg via INTRAVENOUS
  Filled 2023-08-07 (×3): qty 1

## 2023-08-07 MED ORDER — FENTANYL CITRATE (PF) 100 MCG/2ML IJ SOLN
INTRAMUSCULAR | Status: AC
  Filled 2023-08-07: qty 2

## 2023-08-07 MED ORDER — DEXAMETHASONE SODIUM PHOSPHATE 4 MG/ML IJ SOLN
INTRAMUSCULAR | Status: DC | PRN
  Administered 2023-08-07: 5 mg via PERINEURAL

## 2023-08-07 MED ORDER — ACETAMINOPHEN 325 MG PO TABS
325.0000 mg | ORAL_TABLET | Freq: Four times a day (QID) | ORAL | Status: DC | PRN
  Administered 2023-08-08: 650 mg via ORAL
  Filled 2023-08-07: qty 2

## 2023-08-07 MED ORDER — LACTATED RINGERS IV SOLN: INTRAVENOUS | Status: DC

## 2023-08-07 MED ORDER — PROPOFOL 500 MG/50ML IV EMUL
INTRAVENOUS | Status: AC
  Filled 2023-08-07: qty 50

## 2023-08-07 MED ORDER — FENTANYL CITRATE (PF) 100 MCG/2ML IJ SOLN
INTRAMUSCULAR | Status: DC | PRN
  Administered 2023-08-07 (×2): 50 ug via INTRAVENOUS

## 2023-08-07 MED ORDER — METOCLOPRAMIDE HCL 5 MG PO TABS
5.0000 mg | ORAL_TABLET | Freq: Three times a day (TID) | ORAL | Status: DC | PRN
  Administered 2023-08-08: 10 mg via ORAL
  Filled 2023-08-07: qty 2

## 2023-08-07 MED ORDER — LIDOCAINE HCL (PF) 2 % IJ SOLN
INTRAMUSCULAR | Status: DC | PRN
  Administered 2023-08-07: 40 mg via INTRADERMAL

## 2023-08-07 MED ORDER — PHENYLEPHRINE 80 MCG/ML (10ML) SYRINGE FOR IV PUSH (FOR BLOOD PRESSURE SUPPORT)
PREFILLED_SYRINGE | INTRAVENOUS | Status: AC
  Filled 2023-08-07: qty 10

## 2023-08-07 MED ORDER — KCL IN DEXTROSE-NACL 20-5-0.45 MEQ/L-%-% IV SOLN
INTRAVENOUS | Status: AC
  Filled 2023-08-07 (×2): qty 1000

## 2023-08-07 MED ORDER — METHOCARBAMOL 500 MG PO TABS
500.0000 mg | ORAL_TABLET | Freq: Four times a day (QID) | ORAL | Status: DC | PRN
  Administered 2023-08-07 – 2023-08-09 (×6): 500 mg via ORAL
  Filled 2023-08-07 (×6): qty 1

## 2023-08-07 MED ORDER — 0.9 % SODIUM CHLORIDE (POUR BTL) OPTIME
TOPICAL | Status: DC | PRN
  Administered 2023-08-07: 1000 mL

## 2023-08-07 MED ORDER — METOCLOPRAMIDE HCL 5 MG/ML IJ SOLN
5.0000 mg | Freq: Three times a day (TID) | INTRAMUSCULAR | Status: DC | PRN
  Administered 2023-08-08: 10 mg via INTRAVENOUS
  Filled 2023-08-07: qty 2

## 2023-08-07 MED ORDER — OXYCODONE HCL 5 MG PO TABS: 5.0000 mg | ORAL_TABLET | Freq: Once | ORAL | Status: DC | PRN

## 2023-08-07 MED ORDER — PHENOL 1.4 % MT LIQD: 1.0000 | OROMUCOSAL | Status: DC | PRN

## 2023-08-07 MED ORDER — ALUM & MAG HYDROXIDE-SIMETH 200-200-20 MG/5ML PO SUSP: 30.0000 mL | ORAL | Status: DC | PRN

## 2023-08-07 MED ORDER — CELECOXIB 200 MG PO CAPS
200.0000 mg | ORAL_CAPSULE | Freq: Once | ORAL | Status: AC
  Administered 2023-08-07: 200 mg via ORAL
  Filled 2023-08-07: qty 1

## 2023-08-07 MED ORDER — VALACYCLOVIR HCL 500 MG PO TABS
500.0000 mg | ORAL_TABLET | Freq: Every day | ORAL | Status: DC | PRN
  Administered 2023-08-08: 500 mg via ORAL
  Filled 2023-08-07 (×2): qty 1

## 2023-08-07 MED ORDER — ACETAMINOPHEN 10 MG/ML IV SOLN
1000.0000 mg | INTRAVENOUS | Status: AC
Start: 2023-08-07 — End: 2023-08-07
  Administered 2023-08-07: 1000 mg via INTRAVENOUS
  Filled 2023-08-07: qty 100

## 2023-08-07 MED ORDER — OXYCODONE HCL 5 MG/5ML PO SOLN: 5.0000 mg | Freq: Once | ORAL | Status: DC | PRN

## 2023-08-07 MED ORDER — CHLORHEXIDINE GLUCONATE 0.12 % MT SOLN
15.0000 mL | Freq: Once | OROMUCOSAL | Status: AC
  Administered 2023-08-07: 15 mL via OROMUCOSAL

## 2023-08-07 MED ORDER — POLYETHYLENE GLYCOL 3350 17 G PO PACK: 17.0000 g | PACK | Freq: Every day | ORAL | Status: DC | PRN

## 2023-08-07 MED ORDER — APIXABAN 2.5 MG PO TABS
2.5000 mg | ORAL_TABLET | Freq: Two times a day (BID) | ORAL | Status: DC
  Administered 2023-08-08 – 2023-08-09 (×3): 2.5 mg via ORAL
  Filled 2023-08-07 (×3): qty 1

## 2023-08-07 MED ORDER — SODIUM CHLORIDE (PF) 0.9 % IJ SOLN
INTRAMUSCULAR | Status: AC
  Filled 2023-08-07: qty 40

## 2023-08-07 MED ORDER — METHOCARBAMOL 1000 MG/10ML IJ SOLN: 500.0000 mg | Freq: Four times a day (QID) | INTRAMUSCULAR | Status: DC | PRN

## 2023-08-07 MED ORDER — LIDOCAINE HCL (PF) 2 % IJ SOLN
INTRAMUSCULAR | Status: AC
  Filled 2023-08-07: qty 5

## 2023-08-07 MED ORDER — ONDANSETRON HCL 4 MG/2ML IJ SOLN
INTRAMUSCULAR | Status: DC | PRN
  Administered 2023-08-07: 4 mg via INTRAVENOUS

## 2023-08-07 MED ORDER — MIDAZOLAM HCL 2 MG/2ML IJ SOLN
INTRAMUSCULAR | Status: AC
  Filled 2023-08-07: qty 2

## 2023-08-07 MED ORDER — ROPIVACAINE HCL 7.5 MG/ML IJ SOLN
INTRAMUSCULAR | Status: DC | PRN
Start: 2023-08-07 — End: 2023-08-07
  Administered 2023-08-07: 20 mL via PERINEURAL

## 2023-08-07 MED ORDER — PANTOPRAZOLE SODIUM 40 MG PO TBEC
40.0000 mg | DELAYED_RELEASE_TABLET | Freq: Every day | ORAL | Status: DC
  Administered 2023-08-08 – 2023-08-09 (×2): 40 mg via ORAL
  Filled 2023-08-07 (×2): qty 1

## 2023-08-07 MED ORDER — CLONIDINE HCL (ANALGESIA) 100 MCG/ML EP SOLN
EPIDURAL | Status: DC | PRN
  Administered 2023-08-07: 80 ug

## 2023-08-07 MED ORDER — TRANEXAMIC ACID-NACL 1000-0.7 MG/100ML-% IV SOLN
1000.0000 mg | INTRAVENOUS | Status: AC
  Administered 2023-08-07: 1000 mg via INTRAVENOUS
  Filled 2023-08-07: qty 100

## 2023-08-07 MED ORDER — PROPOFOL 1000 MG/100ML IV EMUL
INTRAVENOUS | Status: AC
  Filled 2023-08-07: qty 100

## 2023-08-07 MED ORDER — BISACODYL 5 MG PO TBEC: 5.0000 mg | DELAYED_RELEASE_TABLET | Freq: Every day | ORAL | Status: DC | PRN

## 2023-08-07 MED ORDER — ACETAMINOPHEN 500 MG PO TABS: 1000.0000 mg | ORAL_TABLET | Freq: Once | ORAL | Status: AC

## 2023-08-07 MED ORDER — CEFAZOLIN SODIUM-DEXTROSE 2-4 GM/100ML-% IV SOLN
2.0000 g | Freq: Four times a day (QID) | INTRAVENOUS | Status: AC
  Administered 2023-08-07 – 2023-08-08 (×3): 2 g via INTRAVENOUS
  Filled 2023-08-07 (×3): qty 100

## 2023-08-07 MED ORDER — DEXAMETHASONE SODIUM PHOSPHATE 10 MG/ML IJ SOLN
INTRAMUSCULAR | Status: DC | PRN
  Administered 2023-08-07: 10 mg via INTRAVENOUS

## 2023-08-07 MED ORDER — PHENYLEPHRINE 80 MCG/ML (10ML) SYRINGE FOR IV PUSH (FOR BLOOD PRESSURE SUPPORT)
PREFILLED_SYRINGE | INTRAVENOUS | Status: DC | PRN
  Administered 2023-08-07: 160 ug via INTRAVENOUS
  Administered 2023-08-07: 80 ug via INTRAVENOUS
  Administered 2023-08-07: 160 ug via INTRAVENOUS
  Administered 2023-08-07 (×4): 80 ug via INTRAVENOUS
  Administered 2023-08-07 (×2): 160 ug via INTRAVENOUS

## 2023-08-07 MED ORDER — ORAL CARE MOUTH RINSE: 15.0000 mL | Freq: Once | OROMUCOSAL | Status: AC

## 2023-08-07 SURGICAL SUPPLY — 62 items
ATTUNE PS FEM RT SZ 6 CEM KNEE (Femur) IMPLANT
ATTUNE PSRP INSR SZ6 7 KNEE (Insert) IMPLANT
BAG COUNTER SPONGE SURGICOUNT (BAG) IMPLANT
BAG ZIPLOCK 12X15 (MISCELLANEOUS) IMPLANT
BASE TIBIAL ROT PLAT SZ 5 KNEE (Knees) IMPLANT
BLADE SAW SGTL 11.0X1.19X90.0M (BLADE) ×2 IMPLANT
BLADE SAW SGTL 13.0X1.19X90.0M (BLADE) ×2 IMPLANT
BLADE SURG SZ10 CARB STEEL (BLADE) ×4 IMPLANT
BNDG ELASTIC 4INX 5YD STR LF (GAUZE/BANDAGES/DRESSINGS) ×2 IMPLANT
BNDG ELASTIC 6INX 5YD STR LF (GAUZE/BANDAGES/DRESSINGS) ×2 IMPLANT
BOWL SMART MIX CTS (DISPOSABLE) ×2 IMPLANT
CEMENT HV SMART SET (Cement) ×4 IMPLANT
COVER SURGICAL LIGHT HANDLE (MISCELLANEOUS) ×2 IMPLANT
CUFF TRNQT CYL 34X4.125X (TOURNIQUET CUFF) ×2 IMPLANT
DRAPE INCISE IOBAN 66X45 STRL (DRAPES) IMPLANT
DRAPE SHEET LG 3/4 BI-LAMINATE (DRAPES) ×2 IMPLANT
DRAPE SURG ORHT 6 SPLT 77X108 (DRAPES) ×4 IMPLANT
DRAPE TOP 10253 STERILE (DRAPES) ×2 IMPLANT
DRAPE U-SHAPE 47X51 STRL (DRAPES) ×2 IMPLANT
DRSG AQUACEL AG ADV 3.5X10 (GAUZE/BANDAGES/DRESSINGS) ×2 IMPLANT
DRSG TEGADERM 4X4.75 (GAUZE/BANDAGES/DRESSINGS) IMPLANT
DURAPREP 26ML APPLICATOR (WOUND CARE) ×2 IMPLANT
ELECT BLADE TIP CTD 4 INCH (ELECTRODE) ×2 IMPLANT
ELECT REM PT RETURN 15FT ADLT (MISCELLANEOUS) ×2 IMPLANT
EVACUATOR 1/8 PVC DRAIN (DRAIN) IMPLANT
GAUZE SPONGE 2X2 8PLY STRL LF (GAUZE/BANDAGES/DRESSINGS) IMPLANT
GLOVE BIO SURGEON STRL SZ7 (GLOVE) ×2 IMPLANT
GLOVE BIOGEL PI IND STRL 7.0 (GLOVE) ×2 IMPLANT
GLOVE BIOGEL PI IND STRL 8 (GLOVE) ×2 IMPLANT
GLOVE SURG SS PI 8.0 STRL IVOR (GLOVE) ×2 IMPLANT
GOWN STRL REUS W/ TWL XL LVL3 (GOWN DISPOSABLE) ×4 IMPLANT
HEMOSTAT SPONGE AVITENE ULTRA (HEMOSTASIS) IMPLANT
HOLDER FOLEY CATH W/STRAP (MISCELLANEOUS) ×2 IMPLANT
IMMOBILIZER KNEE 20 (SOFTGOODS) ×1 IMPLANT
IMMOBILIZER KNEE 20 THIGH 36 (SOFTGOODS) ×2 IMPLANT
KIT TURNOVER KIT A (KITS) IMPLANT
MANIFOLD NEPTUNE II (INSTRUMENTS) ×2 IMPLANT
NS IRRIG 1000ML POUR BTL (IV SOLUTION) IMPLANT
PACK TOTAL KNEE CUSTOM (KITS) ×2 IMPLANT
PATELLA MEDIAL ATTUN 35MM KNEE (Knees) IMPLANT
PIN STEINMAN FIXATION KNEE (PIN) IMPLANT
PROTECTOR NERVE ULNAR (MISCELLANEOUS) ×2 IMPLANT
SAW OSC TIP CART 19.5X105X1.3 (SAW) IMPLANT
SEALER BIPOLAR AQUA 6.0 (INSTRUMENTS) IMPLANT
SET HNDPC FAN SPRY TIP SCT (DISPOSABLE) ×2 IMPLANT
SOLUTION PRONTOSAN WOUND 350ML (IRRIGATION / IRRIGATOR) ×2 IMPLANT
SPIKE FLUID TRANSFER (MISCELLANEOUS) ×2 IMPLANT
STAPLER VISISTAT (STAPLE) IMPLANT
STRIP CLOSURE SKIN 1/2X4 (GAUZE/BANDAGES/DRESSINGS) IMPLANT
SUT BONE WAX W31G (SUTURE) ×2 IMPLANT
SUT MNCRL AB 4-0 PS2 18 (SUTURE) IMPLANT
SUT STRATAFIX 0 PDS 27 VIOLET (SUTURE) ×1 IMPLANT
SUT VIC AB 1 CT1 27XBRD ANTBC (SUTURE) ×6 IMPLANT
SUT VIC AB 2-0 CT1 TAPERPNT 27 (SUTURE) ×6 IMPLANT
SUTURE STRATFX 0 PDS 27 VIOLET (SUTURE) ×2 IMPLANT
TIBIAL BASE ROT PLAT SZ 5 KNEE (Knees) ×1 IMPLANT
TRAY FOLEY MTR SLVR 14FR STAT (SET/KITS/TRAYS/PACK) IMPLANT
TRAY FOLEY MTR SLVR 16FR STAT (SET/KITS/TRAYS/PACK) ×2 IMPLANT
TUBE SUCTION HIGH CAP CLEAR NV (SUCTIONS) ×2 IMPLANT
WATER STERILE IRR 1000ML POUR (IV SOLUTION) ×2 IMPLANT
WIPE CHG 2% PREP (PERSONAL CARE ITEMS) ×2 IMPLANT
WRAP KNEE MAXI GEL POST OP (GAUZE/BANDAGES/DRESSINGS) ×2 IMPLANT

## 2023-08-07 NOTE — Plan of Care (Signed)
   Problem: Activity: Goal: Risk for activity intolerance will decrease Outcome: Progressing   Problem: Pain Managment: Goal: General experience of comfort will improve and/or be controlled Outcome: Progressing   Problem: Safety: Goal: Ability to remain free from injury will improve Outcome: Progressing

## 2023-08-07 NOTE — Evaluation (Addendum)
 Physical Therapy Evaluation Patient Details Name: Julia Murray MRN: [id number] DOB: 1956/02/22 Today's Date: 08/07/2023  History of Present Illness  68 yo female s/p R TKA on 08/07/23. PMH: IM nail L femur 2024, hardware removal L femur 2024, L TKA 2022, GERD, HLD, arthritis, Covid  Clinical Impression  Pt is s/p TKA resulting in the deficits listed below (see PT Problem List).  Pt amb ~ 46' with RW and min assist/CGA for safety. Distance limitedd/t nausea. Anticipate steady progress in acute setting. RN notified of nausea   Pt will benefit from acute skilled PT to increase their independence and safety with mobility to allow discharge.          If plan is discharge home, recommend the following: A little help with walking and/or transfers;A little help with bathing/dressing/bathroom;Assistance with cooking/housework;Assist for transportation;Help with stairs or ramp for entrance   Can travel by private vehicle        Equipment Recommendations Rolling walker (2 wheels)  Recommendations for Other Services       Functional Status Assessment Patient has had a recent decline in their functional status and demonstrates the ability to make significant improvements in function in a reasonable and predictable amount of time.     Precautions / Restrictions Precautions Precautions: Knee;Fall Recall of Precautions/Restrictions: Intact Restrictions Weight Bearing Restrictions Per Provider Order: No Other Position/Activity Restrictions: WBAT      Mobility  Bed Mobility Overal bed mobility: Needs Assistance Bed Mobility: Supine to Sit     Supine to sit: Contact guard, Supervision     General bed mobility comments: for lines and safety    Transfers Overall transfer level: Needs assistance Equipment used: Rolling walker (2 wheels) Transfers: Sit to/from Stand Sit to Stand: Contact guard assist, Min assist           General transfer comment: cues for proper hand placement and  RLE position    Ambulation/Gait Ambulation/Gait assistance: Min assist, Contact guard assist Gait Distance (Feet): 18 Feet Assistive device: Rolling walker (2 wheels) Gait Pattern/deviations: Step-to pattern       General Gait Details: cues for sequence and RW distance from self  Stairs            Wheelchair Mobility     Tilt Bed    Modified Rankin (Stroke Patients Only)       Balance Overall balance assessment: No apparent balance deficits (not formally assessed)                                           Pertinent Vitals/Pain Pain Assessment Pain Assessment: 0-10 Pain Location: R knee Pain Intervention(s): Limited activity within patient's tolerance, Monitored during session, Premedicated before session    Home Living Family/patient expects to be discharged to:: Private residence Living Arrangements: Spouse/significant other Available Help at Discharge: Family;Available PRN/intermittently Type of Home: House Home Access: Stairs to enter Entrance Stairs-Rails: Can reach both;Right;Left Entrance Stairs-Number of Steps: 1   Home Layout: Two level;Able to live on main level with bedroom/bathroom Home Equipment: None      Prior Function Prior Level of Function : Independent/Modified Independent                     Extremity/Trunk Assessment   Upper Extremity Assessment Upper Extremity Assessment: Overall WFL for tasks assessed    Lower Extremity Assessment Lower Extremity Assessment: RLE deficits/detail  RLE Deficits / Details: ankle WFL, knee extension and hip flexion  2+/5, limited by anticipated post op deficits       Communication   Communication Communication: No apparent difficulties    Cognition Arousal: Alert Behavior During Therapy: WFL for tasks assessed/performed   PT - Cognitive impairments: No apparent impairments                         Following commands: Intact       Cueing Cueing  Techniques: Verbal cues     General Comments      Exercises     Assessment/Plan    PT Assessment Patient needs continued PT services  PT Problem List Decreased strength;Decreased range of motion;Decreased activity tolerance;Decreased mobility;Decreased knowledge of use of DME;Pain       PT Treatment Interventions DME instruction;Gait training;Stair training;Functional mobility training;Therapeutic activities;Therapeutic exercise;Patient/family education    PT Goals (Current goals can be found in the Care Plan section)  Acute Rehab PT Goals Patient Stated Goal[place] after knee rehab PT Goal Formulation: With patient Time For Goal Achievement: 08/14/23 Potential to Achieve Goals: Good    Frequency       Co-evaluation               AM-PAC PT "6 Clicks" Mobility  Outcome Measure Help needed turning from your back to your side while in a flat bed without using bedrails?: A Little Help needed moving from lying on your back to sitting on the side of a flat bed without using bedrails?: A Little Help needed moving to and from a bed to a chair (including a wheelchair)?: A Little Help needed standing up from a chair using your arms (e.g., wheelchair or bedside chair)?: A Little Help needed to walk in hospital room?: A Little Help needed climbing 3-5 steps with a railing? : A Little 6 Click Score: 18    End of Session Equipment Utilized During Treatment: Gait belt Activity Tolerance: Patient tolerated treatment well Patient left: in chair;with call bell/phone within reach;with chair alarm set   PT Visit Diagnosis: Other abnormalities of gait and mobility (R26.89)    Time: 2841-3244 PT Time Calculation (min) (ACUTE ONLY): 17 min   Charges:   PT Evaluation $PT Eval Low Complexity: 1 Low   PT General Charges $$ ACUTE PT VISIT: 1 Visit         Velma Agnes, PT  Acute Rehab Dept [facility]) [phone]  08/07/2023   [facility] 08/07/2023, 3:57 PM

## 2023-08-07 NOTE — Transfer of Care (Signed)
 Immediate Anesthesia Transfer of Care Note  Patient: Julia Murray  Procedure(s) Performed: ARTHROPLASTY, KNEE, TOTAL (Right: Knee)  Patient Location: PACU  Anesthesia Type:MAC, Regional, and Spinal  Level of Consciousness: awake and alert   Airway & Oxygen Therapy: Patient Spontanous Breathing and Patient connected to face mask oxygen  Post-op Assessment: Report given to RN and Post -op Vital signs reviewed and stable  Post vital signs: Reviewed and stable  Last Vitals:  Vitals Value Taken Time  BP 97/57 08/07/23 1108  Temp    Pulse 59 08/07/23 1109  Resp 12 08/07/23 1109  SpO2 100 % 08/07/23 1109  Vitals shown include unfiled device data.  Last Pain:  Vitals:   08/07/23 0700  TempSrc: Oral  PainSc:          Complications: No notable events documented.

## 2023-08-07 NOTE — Brief Op Note (Signed)
 08/07/2023  8:17 AM  PATIENT:  Julia Murray  68 y.o. female  PRE-OPERATIVE DIAGNOSIS:  Right knee degenerative joint disease  POST-OPERATIVE DIAGNOSIS:  Right knee degenerative joint disease  PROCEDURE:  Procedure(s): ARTHROPLASTY, KNEE, TOTAL (Right) The aquamantis was utilized for this case to help facilitate better hemostasis as patient was felt to be at increased risk of bleeding because of history of coagulopathy.   SURGEON:  Surgeons and Role:    * Jene Every, MD - Primary  PHYSICIAN ASSISTANT:   ASSISTANTS: Bissell   ANESTHESIA:   spinal  EBL:  25   BLOOD ADMINISTERED:none  DRAINS: none   LOCAL MEDICATIONS USED:  MARCAINE     SPECIMEN:  No Specimen and Mastectomy with SLN  DISPOSITION OF SPECIMEN:  N/A  COUNTS:  YES  TOURNIQUET:   DICTATION: .Other Dictation: Dictation Number [id number]  PLAN OF CARE: Admit for overnight observation  PATIENT DISPOSITION:  PACU - hemodynamically stable.   Delay start of Pharmacological VTE agent (>24hrs) due to surgical blood loss or risk of bleeding: yes

## 2023-08-07 NOTE — Discharge Instructions (Addendum)
 Elevate leg above heart 6x a day for each Use knee immobilizer while walking until can SLR x 10 Use knee immobilizer in bed to keep knee in extension Aquacel dressing may remain in place for 1 week. May shower with aquacel dressing in place. If the dressing becomes saturated or peels off, you may remove aquacel dressing. Do not remove steri-strips if they are present. Place new dressing with gauze and tape or ACE bandage which should be kept clean and dry and changed daily.  INSTRUCTIONS AFTER JOINT REPLACEMENT   Remove items at home which could result in a fall. This includes throw rugs or furniture in walking pathways ICE to the affected joint every three hours while awake for 30 minutes at a time, for at least the first 3-5 days, and then as needed for pain and swelling.  Continue to use ice for pain and swelling. You may notice swelling that will progress down to the foot and ankle.  This is normal after surgery.  Elevate your leg when you are not up walking on it.   Continue to use the breathing machine you got in the hospital (incentive spirometer) which will help keep your temperature down.  It is common for your temperature to cycle up and down following surgery, especially at night when you are not up moving around and exerting yourself.  The breathing machine keeps your lungs expanded and your temperature down.   DIET:  As you were doing prior to hospitalization, we recommend a well-balanced diet.  DRESSING / WOUND CARE / SHOWERING  You may change your dressing 7 days after surgery.  Then change the dressing every day with sterile gauze.  Please use good hand washing techniques before changing the dressing.  Do not use any lotions or creams on the incision until instructed by your surgeon.  ACTIVITY  Increase activity slowly as tolerated, but follow the weight bearing instructions below.   No driving for 6 weeks or until further direction given by your physician.  You cannot  drive while taking narcotics.  No lifting or carrying greater than 10 lbs. until further directed by your surgeon. Avoid periods of inactivity such as sitting longer than an hour when not asleep. This helps prevent blood clots.  You may return to work once you are authorized by your doctor.     WEIGHT BEARING   Weight bearing as tolerated with assist device (walker, cane, etc) as directed, use it as long as suggested by your surgeon or therapist, typically at least 4-6 weeks.   EXERCISES  Results after joint replacement surgery are often greatly improved when you follow the exercise, range of motion and muscle strengthening exercises prescribed by your doctor. Safety measures are also important to protect the joint from further injury. Any time any of these exercises cause you to have increased pain or swelling, decrease what you are doing until you are comfortable again and then slowly increase them. If you have problems or questions, call your caregiver or physical therapist for advice.   Rehabilitation is important following a joint replacement. After just a few days of immobilization, the muscles of the leg can become weakened and shrink (atrophy).  These exercises are designed to build up the tone and strength of the thigh and leg muscles and to improve motion. Often times heat used for twenty to thirty minutes before working out will loosen up your tissues and help with improving the range of motion but do not use heat for  the first two weeks following surgery (sometimes heat can increase post-operative swelling).   These exercises can be done on a training (exercise) mat, on the floor, on a table or on a bed. Use whatever works the best and is most comfortable for you.    Use music or television while you are exercising so that the exercises are a pleasant break in your day. This will make your life better with the exercises acting as a break in your routine that you can look forward to.    Perform all exercises about fifteen times, three times per day or as directed.  You should exercise both the operative leg and the other leg as well.  Exercises include:   Quad Sets - Tighten up the muscle on the front of the thigh (Quad) and hold for 5-10 seconds.   Straight Leg Raises - With your knee straight (if you were given a brace, keep it on), lift the leg to 60 degrees, hold for 3 seconds, and slowly lower the leg.  Perform this exercise against resistance later as your leg gets stronger.  Leg Slides: Lying on your back, slowly slide your foot toward your buttocks, bending your knee up off the floor (only go as far as is comfortable). Then slowly slide your foot back down until your leg is flat on the floor again.  Angel Wings: Lying on your back spread your legs to the side as far apart as you can without causing discomfort.  Hamstring Strength:  Lying on your back, push your heel against the floor with your leg straight by tightening up the muscles of your buttocks.  Repeat, but this time bend your knee to a comfortable angle, and push your heel against the floor.  You may put a pillow under the heel to make it more comfortable if necessary.   A rehabilitation program following joint replacement surgery can speed recovery and prevent re-injury in the future due to weakened muscles. Contact your doctor or a physical therapist for more information on knee rehabilitation.    CONSTIPATION  Constipation is defined medically as fewer than three stools per week and severe constipation as less than one stool per week.  Even if you have a regular bowel pattern at home, your normal regimen is likely to be disrupted due to multiple reasons following surgery.  Combination of anesthesia, postoperative narcotics, change in appetite and fluid intake all can affect your bowels.   YOU MUST use at least one of the following options; they are listed in order of increasing strength to get the job done.   They are all available over the counter, and you may need to use some, POSSIBLY even all of these options:    Drink plenty of fluids (prune juice may be helpful) and high fiber foods Colace 100 mg by mouth twice a day  Senokot for constipation as directed and as needed Dulcolax (bisacodyl), take with full glass of water  Miralax (polyethylene glycol) once or twice a day as needed.  If you have tried all these things and are unable to have a bowel movement in the first 3-4 days after surgery call either your surgeon or your primary doctor.    If you experience loose stools or diarrhea, hold the medications until you stool forms back up.  If your symptoms do not get better within 1 week or if they get worse, check with your doctor.  If you experience "the worst abdominal pain ever" or develop nausea  or vomiting, please contact the office immediately for further recommendations for treatment.   ITCHING:  If you experience itching with your medications, try taking only a single pain pill, or even half a pain pill at a time.  You can also use Benadryl over the counter for itching or also to help with sleep.   TED HOSE STOCKINGS:  Use stockings on both legs until for at least 2 weeks or as directed by physician office. They may be removed at night for sleeping.  MEDICATIONS:  See your medication summary on the "After Visit Summary" that nursing will review with you.  You may have some home medications which will be placed on hold until you complete the course of blood thinner medication.  It is important for you to complete the blood thinner medication as prescribed.  PRECAUTIONS:  If you experience chest pain or shortness of breath - call 911 immediately for transfer to the hospital emergency department.   If you develop a fever greater that 101 F, purulent drainage from wound, increased redness or drainage from wound, foul odor from the wound/dressing, or calf pain - CONTACT YOUR SURGEON.                                                    FOLLOW-UP APPOINTMENTS:  If you do not already have a post-op appointment, please call the office for an appointment to be seen by your surgeon.  Guidelines for how soon to be seen are listed in your "After Visit Summary", but are typically between 1-4 weeks after surgery.  OTHER INSTRUCTIONS:   Knee Replacement:  Do not place pillow under knee, focus on keeping the knee straight while resting. CPM instructions: 0-90 degrees, 2 hours in the morning, 2 hours in the afternoon, and 2 hours in the evening. Place foam block, curve side up under heel at all times except when in CPM or when walking.  DO NOT modify, tear, cut, or change the foam block in any way.  POST-OPERATIVE OPIOID TAPER INSTRUCTIONS: It is important to wean off of your opioid medication as soon as possible. If you do not need pain medication after your surgery it is ok to stop day one. Opioids include: Codeine, Hydrocodone(Norco, Vicodin), Oxycodone(Percocet, oxycontin) and hydromorphone amongst others.  Long term and even short term use of opiods can cause: Increased pain response Dependence Constipation Depression Respiratory depression And more.  Withdrawal symptoms can include Flu like symptoms Nausea, vomiting And more Techniques to manage these symptoms Hydrate well Eat regular healthy meals Stay active Use relaxation techniques(deep breathing, meditating, yoga) Do Not substitute Alcohol to help with tapering If you have been on opioids for less than two weeks and do not have pain than it is ok to stop all together.  Plan to wean off of opioids This plan should start within one week post op of your joint replacement. Maintain the same interval or time between taking each dose and first decrease the dose.  Cut the total daily intake of opioids by one tablet each day Next start to increase the time between doses. The last dose that should be eliminated is the evening dose.    MAKE SURE YOU:  Understand these instructions.  Get help right away if you are not doing well or get worse.    Thank you for letting  us be a part of your medical care team.  It is a privilege we respect greatly.  We hope these instructions will help you stay on track for a fast and full recovery!       Information on my medicine - ELIQUIS (apixaban)  Why was Eliquis prescribed for you? Eliquis was prescribed for you to reduce the risk of blood clots forming after orthopedic surgery.    What do You need to know about Eliquis? Take your Eliquis TWICE DAILY - one tablet in the morning and one tablet in the evening with or without food.  It would be best to take the dose about the same time each day.  If you have difficulty swallowing the tablet whole please discuss with your pharmacist how to take the medication safely.  Take Eliquis exactly as prescribed by your doctor and DO NOT stop taking Eliquis without talking to the doctor who prescribed the medication.  Stopping without other medication to take the place of Eliquis may increase your risk of developing a clot.  After discharge, you should have regular check-up appointments with your healthcare provider that is prescribing your Eliquis.  What do you do if you miss a dose? If a dose of ELIQUIS is not taken at the scheduled time, take it as soon as possible on the same day and twice-daily administration should be resumed.  The dose should not be doubled to make up for a missed dose.  Do not take more than one tablet of ELIQUIS at the same time.  Important Safety Information A possible side effect of Eliquis is bleeding. You should call your healthcare provider right away if you experience any of the following: Bleeding from an injury or your nose that does not stop. Unusual colored urine (red or dark brown) or unusual colored stools (red or black). Unusual bruising for unknown reasons. A serious fall or if you hit  your head (even if there is no bleeding).  Some medicines may interact with Eliquis and might increase your risk of bleeding or clotting while on Eliquis. To help avoid this, consult your healthcare provider or pharmacist prior to using any new prescription or non-prescription medications, including herbals, vitamins, non-steroidal anti-inflammatory drugs (NSAIDs) and supplements.  This website has more information on Eliquis (apixaban): http://www.eliquis.com/eliquis/home

## 2023-08-07 NOTE — Interval H&P Note (Signed)
 History and Physical Interval Note:  08/07/2023 8:17 AM  Julia Murray  has presented today for surgery, with the diagnosis of Right knee degenerative joint disease.  The various methods of treatment have been discussed with the patient and family. After consideration of risks, benefits and other options for treatment, the patient has consented to  Procedure(s): ARTHROPLASTY, KNEE, TOTAL (Right) as a surgical intervention.  The patient's history has been reviewed, patient examined, no change in status, stable for surgery.  I have reviewed the patient's chart and labs.  Questions were answered to the patient's satisfaction.     Javier Docker

## 2023-08-07 NOTE — Anesthesia Postprocedure Evaluation (Signed)
 Anesthesia Post Note  Patient: Julia Murray  Procedure(s) Performed: ARTHROPLASTY, KNEE, TOTAL (Right: Knee)     Patient location during evaluation: PACU Anesthesia Type: Spinal Level of consciousness: sedated and patient cooperative Pain management: pain level controlled Vital Signs Assessment: post-procedure vital signs reviewed and stable Respiratory status: spontaneous breathing Cardiovascular status: stable Anesthetic complications: no   No notable events documented.  Last Vitals:  Vitals:   08/07/23 1236 08/07/23 1439  BP: 114/66 122/72  Pulse: (!) 52 75  Resp: 17 16  Temp: (!) 35.8 C (!) 36.4 C  SpO2:  96%    Last Pain:  Vitals:   08/07/23 1449  TempSrc:   PainSc: 7                  Lewie Loron

## 2023-08-07 NOTE — Anesthesia Procedure Notes (Addendum)
 Spinal  Patient location during procedure: OR Start time: 08/07/2023 8:38 AM End time: 08/07/2023 8:42 AM Reason for block: surgical anesthesia Staffing Performed: anesthesiologist  Anesthesiologist: Lewie Loron, MD Performed by: Lewie Loron, MD Authorized by: Lewie Loron, MD   Preanesthetic Checklist Completed: patient identified, IV checked, site marked, risks and benefits discussed, surgical consent, monitors and equipment checked, pre-op evaluation and timeout performed Spinal Block Patient position: sitting Prep: DuraPrep and site prepped and draped Patient monitoring: heart rate, continuous pulse ox and blood pressure Approach: midline Location: L3-4 Injection technique: single-shot Needle Needle type: Spinocan  Needle gauge: 25 G Needle length: 9 cm Additional Notes Expiration date of kit checked and confirmed. Patient tolerated procedure well, without complications.

## 2023-08-07 NOTE — Anesthesia Procedure Notes (Signed)
 Anesthesia Regional Block: Adductor canal block   Pre-Anesthetic Checklist: , timeout performed,  Correct Patient, Correct Site, Correct Laterality,  Correct Procedure, Correct Position, site marked,  Risks and benefits discussed,  Surgical consent,  Pre-op evaluation,  At surgeon's request and post-op pain management  Laterality: Lower  Prep: chloraprep       Needles:  Injection technique: Single-shot  Needle Type: Stimiplex     Needle Length: 9cm  Needle Gauge: 21     Additional Needles:   Procedures:,,,, ultrasound used (permanent image in chart),,    Narrative:  Start time: 08/07/2023 7:58 AM End time: 08/07/2023 8:18 AM Injection made incrementally with aspirations every 5 mL.  Performed by: Personally  Anesthesiologist: Lewie Loron, MD  Additional Notes: BP cuff, EKG monitors applied. Sedation begun. Artery and nerve location verified with ultrasound. Anesthetic injected incrementally (5ml), slowly, and after negative aspirations under direct u/s guidance. Good fascial/perineural spread. Tolerated well.

## 2023-08-08 ENCOUNTER — Encounter (HOSPITAL_COMMUNITY): Payer: Self-pay | Admitting: Specialist

## 2023-08-08 DIAGNOSIS — M1711 Unilateral primary osteoarthritis, right knee: Secondary | ICD-10-CM | POA: Diagnosis not present

## 2023-08-08 LAB — BASIC METABOLIC PANEL WITH GFR
Anion gap: 11 (ref 5–15)
BUN: 17 mg/dL (ref 8–23)
CO2: 19 mmol/L — ABNORMAL LOW (ref 22–32)
Calcium: 8.6 mg/dL — ABNORMAL LOW (ref 8.9–10.3)
Chloride: 100 mmol/L (ref 98–111)
Creatinine, Ser: 0.56 mg/dL (ref 0.44–1.00)
GFR, Estimated: >60 mL/min (ref >60)
Glucose, Bld: 141 mg/dL — ABNORMAL HIGH (ref 70–99)
Potassium: 4.5 mmol/L (ref 3.5–5.1)
Sodium: 130 mmol/L — ABNORMAL LOW (ref 135–145)

## 2023-08-08 LAB — CBC
HCT: 39.2 % (ref 36.0–46.0)
Hemoglobin: 12.1 g/dL (ref 12.0–15.0)
MCH: 30.7 pg (ref 26.0–34.0)
MCHC: 30.9 g/dL (ref 30.0–36.0)
MCV: 99.5 fL (ref 80.0–100.0)
Platelets: 235 10*3/uL (ref 150–400)
RBC: 3.94 MIL/uL (ref 3.87–5.11)
RDW: 14 % (ref 11.5–15.5)
WBC: 10 10*3/uL (ref 4.0–10.5)
nRBC: 0 % (ref 0.0–0.2)

## 2023-08-08 NOTE — TOC Transition Note (Signed)
 Transition of Care [facility]) - Discharge Note   Patient Details  Name: Julia Murray MRN: [id number] Date of Birth: [birth year]  [facility] CM/SW Contact:  Howell Rucks, RN Phone Number: 08/08/2023, 9:59 AM   Clinical Narrative:  Met with pt at bedside to review dc therapy and home equipment needs, pt confirmed OPPT at [facility], has RW, no home DME needs. No TOC needs.      Final next level of care: OP Rehab Barriers to Discharge: No Barriers Identified   Patient Goals and CMS Choice Patient states their goals for this hospitalization and ongoing recovery are:: return home          Discharge Placement                       Discharge Plan and Services Additional resources added to the After Visit Summary for                                       Social Drivers of Health (SDOH) Interventions SDOH Screenings   Food Insecurity: No Food Insecurity (08/07/2023)  Housing: Low Risk  (08/07/2023)  Transportation Needs: No Transportation Needs (08/07/2023)  Utilities: Not At Risk (08/07/2023)  Depression (PHQ2-9): Low Risk  (02/28/2021)  Financial Resource Strain: Low Risk  (06/10/2023)   Received from [facility]  Physical Activity: Sufficiently Active (10/15/2022)   Received from [facility]  Social Connections: Socially Integrated (08/07/2023)  Stress: No Stress Concern Present (10/15/2022)   Received from [facility]  Tobacco Use: Low Risk  (08/07/2023)     Readmission Risk Interventions     No data to display

## 2023-08-08 NOTE — Op Note (Signed)
 NAME: Julia Murray, Julia Murray MEDICAL RECORD NO: [id number] ACCOUNT NO: [id number] DATE OF BIRTH: December 27, 1955 FACILITY: [facility] LOCATION: [id number] PHYSICIAN: Javier Docker, MD  Operative Report   DATE OF PROCEDURE: 08/07/2023  PREOPERATIVE DIAGNOSIS:  End-stage osteoarthrosis, varus deformity of the right knee.  POSTOPERATIVE DIAGNOSIS:  End-stage osteoarthrosis, varus deformity of the right knee.  PROCEDURES PERFORMED:  Right total knee arthroplasty utilizing a Attune Depuy rotating platform, 6 femur, 5 tibia, 35 patella, 7 mm insert.  ANESTHESIA:  Spinal.  ASSISTANT:  Lanna Poche, PA.  HISTORY:  The patient is a 68 year old with end-stage osteoarthrosis, medial compartment with varus deformity indicated for replacement of degenerated joint.  Risks and benefits discussed including bleeding, infection, damage to neurovascular structures,  no change in symptoms or worsening symptoms, DVT, PE, anesthetic complications, etc.  DESCRIPTION OF PROCEDURE:  The patient was placed in the supine position.  After induction of adequate spinal anesthesia and 2 g Kefzol, the right lower extremity was prepped and draped and exsanguinated in the usual sterile fashion.  Thigh tourniquet  inflated to 225 mmHg.  A midline incision was then made over the knee.  Full-thickness flaps were developed.  Medial parapatellar arthrotomy was performed.  Minimal soft tissue elevation medially preserving the MCL.  Patella was gently everted.  The knee  was flexed.  Severe tricompartmental osteoarthrosis, particularly lateral compartment was noted.  Extensive osteophytes were removed with a rongeur. A Leksell was utilized to fashion above the femoral notch as the starting hole for the femoral drill in  line with the femur.  Entered the femoral canal without difficulty.  This was irrigated using a T-handle confirming the intramedullary canal.  I used an intramedullary guide 6-degree right due to a slight valgus deformity.  And  9 off the distal femur due  to a relatively hypoplastic lateral femoral condyle.  This was pinned.  I performed our distal femoral cut just obtaining a subcortical bone on the lateral femoral condyle.  Following this, I then subluxed the tibia, which was fairly tight.  Using the  external alignment guide, the low side was posterolaterally, fairly eburnated.  There was extensive eburnated bone predominantly laterally, minimal wear medially.  Using external alignment guide, bisected tibiotalar joint 3-degree slope.  I took one off  the defect posterolaterally, which was 6 mm off the uninvolved side medially.  Pinned to perform this cut preserving soft tissues posteriorly at all times.  I used a 6 spacer and in extension, had an excellent extension gap, stable.  Reflexed the knee,  sized the femur off the anterior cortex to be a 6 and 3 degrees of external rotation.  This was pinned.  I then put a distal femoral block.  I performed anterior-posterior chamfer cuts.  No notching of the femur occurred and the typical piano sign was  obtained.  No notching of the cortex.  I then subluxed the tibia, sized the tibia to a 5 just to the proximal medial aspect of the tibial tubercle, maximizing the coverage.  This was pinned.  I harvested bone centrally, which was soft and packed into the  distal femur.  I drilled centrally, used her punch guide, turned attention back to the femur, used our box cutting guide, bisecting the canal.  This was pinned.  I performed a box cut, then a trial femur, which sat flush.  I then placed a 7 mm insert.   I reduced the knee and I had full extension and full flexion and good stability with varus  and valgus stress at 0 and 30 degrees.  I then everted the patella and measured it to a 24, planed to a 16, 15.5.  I then measured it to a 35.  I used our paddle  guide parallel to the joint surface.  I drilled our peg holes, placed a trial patella, reduced it, and had excellent patellofemoral  tracking.  Bone instrumentation was removed.  Previous medial and lateral remnant menisci had been removed.  Popliteus was  intact as was the capsule.  Cauterized geniculates.  Used pulsatile lavage.  Prior to this, I flexed the knee, used Exparel through the posteromedial capsule injecting 10 mL after first aspirating without a break in the vacuum.  I anesthetized medial  and lateral menisci, periosteum of the distal femur, quadriceps, proximal medial tibia, etc.  I used pulsatile lavage to clean all bony surfaces.  Knee was flexed.  All surfaces thoroughly dried and mixed cement on the back table in the centrifuge.  In  vacuum. I drilled holes in the proximal tibia posterolaterally.  Through sclerotic bone, it measured 1 cm x 1 cm x 2 cm.  Cement was placed in the proximal tibia digitally pressurizing it.  Cement on the tibial tray was impacted into place.  Redundant  cement removed.  The cement on the femoral component was cemented on the femur and the femoral component and packed it into place.  It sat flush.  A 7 mm trial insert reduced it, held axial load in full extension throughout the curing of the cement.   Redundant cement was removed.  The cement had clamped the patella.  Marcaine with epinephrine followed by Prontosan was placed in the wound, which was covered following the curing of the cement, which occurred at 60 minutes.  The tourniquet was deflated  and any bleeding was cauterized with an Aquamantys.  She had hypertrophic synovium which was excised as well.  There was some increased bleeding with that and it was secured with an Aquamantys.  I then flexed the knee, gently everted the patella, and  meticulously removed all redundant cement.  I removed the trial insert, copiously irrigated with pulsatile lavage followed by Prontosan.  I placed a 7 mm permanent insert and reduced it.  We had full extension, full flexion.  Good stability with varus  and valgus stress at 0 and 30 degrees.   Negative anterior drawer.  Next, I did copious irrigation with Prontosan, reapproximated the patellar arthrotomy with towel clamps, and then in mid flexion, reapproximated the patellar arthrotomy with #1 Vicryl  interrupted figure-of-eight sutures followed by a running Stratafix.  I performed a lateral release as well.  Excellent patellofemoral tracking was noted following this.  Copiously irrigated the subcutaneous tissue with pulsatile lavage and Prontosan,  closed the subcutaneous tissue with 2-0 and skin with subcuticular Monocryl.  Sterile dressing applied.  She had excellent patellofemoral tracking following this and flexion to gravity at 90 degrees.  Sterile dressing applied, placed in an immobilizer and transported to the recovery room in satisfactory condition.  The patient tolerated the procedure well with no complications.  Assistant, Lanna Poche, PA was used throughout the case for patient positioning, traction, and closure.  BLOOD LOSS:  50 mL.   MUK D: 08/07/2023 10:49:07 am T: 08/08/2023 12:38:00 am  JOB: [id number]/ [id number]

## 2023-08-08 NOTE — Progress Notes (Signed)
 Physical Therapy Treatment Patient Details Name: Julia Murray MRN: [id number] DOB: [birth year] Today's Date: 08/08/2023   History of Present Illness 68 yo female s/p R TKA on 08/07/23. PMH: IM nail L femur 2024, hardware removal L femur 2024, L TKA 2022, GERD, HLD, arthritis, Covid    PT Comments  The patient reports pain is more controlled after IV medication. Patient tolerated ambulating 90' using RW. Patient reporting nausea, dry heaves after sitting down. Patient  also has had nausea medication. Will progress ambulation in PM.    If plan is discharge home, recommend the following: A little help with walking and/or transfers;A little help with bathing/dressing/bathroom;Assistance with cooking/housework;Assist for transportation;Help with stairs or ramp for entrance   Can travel by private vehicle        Equipment Recommendations  Rolling walker (2 wheels)    Recommendations for Other Services       Precautions / Restrictions Precautions Precautions: Knee;Fall Recall of Precautions/Restrictions: Intact Restrictions Weight Bearing Restrictions Per Provider Order: No     Mobility  Bed Mobility Overal bed mobility: Modified Independent                  Transfers     Transfers: Sit to/from Stand Sit to Stand: Supervision           General transfer comment: cues for proper hand placement and RLE position    Ambulation/Gait Ambulation/Gait assistance: Supervision Gait Distance (Feet): 20 Feet (then 90) Assistive device: Rolling walker (2 wheels) Gait Pattern/deviations: Step-to pattern, Step-through pattern       General Gait Details: cues for sequence and RW distance from self   Stairs             Wheelchair Mobility     Tilt Bed    Modified Rankin (Stroke Patients Only)       Balance Overall balance assessment: No apparent balance deficits (not formally assessed)                                           Communication    Cognition Arousal: Alert Behavior During Therapy: WFL for tasks assessed/performed   PT - Cognitive impairments: No apparent impairments                         Following commands: Intact      Cueing Cueing Techniques: Verbal cues  Exercises Total Joint Exercises Quad Sets: AROM, Right, 10 reps Hip ABduction/ADduction: AROM, Right, 10 reps Straight Leg Raises: AAROM, 10 reps, Right Long Arc Quad: AROM, Right, 10 reps    General Comments        Pertinent Vitals/Pain Pain Assessment Pain Score: 3  Pain Location: R knee Pain Descriptors / Indicators: Discomfort Pain Intervention(s): Monitored during session, Premedicated before session, Repositioned, Ice applied    Home Living                          Prior Function            PT Goals (current goals can now be found in the care plan section) Progress towards PT goals: Progressing toward goals    Frequency    7X/week      PT Plan      Co-evaluation              AM-PAC  PT "6 Clicks" Mobility   Outcome Measure  Help needed turning from your back to your side while in a flat bed without using bedrails?: None Help needed moving from lying on your back to sitting on the side of a flat bed without using bedrails?: None Help needed moving to and from a bed to a chair (including a wheelchair)?: A Little Help needed standing up from a chair using your arms (e.g., wheelchair or bedside chair)?: A Little Help needed to walk in hospital room?: A Little Help needed climbing 3-5 steps with a railing? : A Little 6 Click Score: 20    End of Session Equipment Utilized During Treatment: Gait belt Activity Tolerance: Patient tolerated treatment well Patient left: in chair;with call bell/phone within reach;with family/visitor present Nurse Communication: Mobility status PT Visit Diagnosis: Other abnormalities of gait and mobility (R26.89)     Time: 1610-9604 PT Time  Calculation (min) (ACUTE ONLY): 35 min  Charges:    $Gait Training: 8-22 mins $Therapeutic Exercise: 8-22 mins PT General Charges $$ ACUTE PT VISIT: 1 Visit                       Rada Hay 08/08/2023, 1:46 PM

## 2023-08-08 NOTE — Plan of Care (Signed)
  Problem: Activity: Goal: Risk for activity intolerance will decrease Outcome: Progressing   Problem: Pain Managment: Goal: General experience of comfort will improve and/or be controlled Outcome: Progressing   Problem: Activity: Goal: Ability to avoid complications of mobility impairment will improve Outcome: Progressing   Problem: Skin Integrity: Goal: Will show signs of wound healing Outcome: Progressing

## 2023-08-08 NOTE — Progress Notes (Addendum)
 Physical Therapy Treatment Patient Details Name: Julia Murray MRN: [id number] DOB: 07/07/1955 Today's Date: 08/08/2023   History of Present Illness 68 yo female s/p R TKA on 08/07/23. PMH: IM nail L femur 2024, hardware removal L femur 2024, L TKA 2022, GERD, HLD, arthritis, Covid    PT Comments  Pattient progressing with ambulation using RW. Onset of nausea after ambulating. Continue PT.     If plan is discharge home, recommend the following: A little help with walking and/or transfers;A little help with bathing/dressing/bathroom;Assistance with cooking/housework;Assist for transportation;Help with stairs or ramp for entrance   Can travel by private vehicle        Equipment Recommendations  Rolling walker (2 wheels)    Recommendations for Other Services       Precautions / Restrictions Precautions Precautions: Knee;Fall Recall of Precautions/Restrictions: Intact Restrictions Weight Bearing Restrictions Per Provider Order: No Other Position/Activity Restrictions: WBAT     Mobility  Bed Mobility Overal bed mobility: Modified Independent                  Transfers   Equipment used: Rolling walker (2 wheels) Transfers: Sit to/from Stand Sit to Stand: Supervision           General transfer comment: cues for proper hand placement and RLE position    Ambulation/Gait Ambulation/Gait assistance: Supervision Gait Distance (Feet): 90 Feet Assistive device: Rolling walker (2 wheels) Gait Pattern/deviations: Step-to pattern, Step-through pattern, Antalgic       General Gait Details: cues for sequence and RW distance from self   Stairs             Wheelchair Mobility     Tilt Bed    Modified Rankin (Stroke Patients Only)       Balance Overall balance assessment: No apparent balance deficits (not formally assessed)                                          Communication    Cognition Arousal: Alert Behavior During Therapy: WFL  for tasks assessed/performed   PT - Cognitive impairments: No apparent impairments                         Following commands: Intact      Cueing Cueing Techniques: Verbal cues  Exercises    General Comments        Pertinent Vitals/Pain Pain Assessment Pain Score: 6  Pain Location: R knee Pain Descriptors / Indicators: Discomfort Pain Intervention(s): Monitored during session, Premedicated before session, Ice applied    Home Living                          Prior Function            PT Goals (current goals can now be found in the care plan section) Progress towards PT goals: Progressing toward goals    Frequency    7X/week      PT Plan      Co-evaluation              AM-PAC PT "6 Clicks" Mobility   Outcome Measure  Help needed turning from your back to your side while in a flat bed without using bedrails?: None Help needed moving from lying on your back to sitting on the side of a flat bed without  using bedrails?: None Help needed moving to and from a bed to a chair (including a wheelchair)?: A Little Help needed standing up from a chair using your arms (e.g., wheelchair or bedside chair)?: A Little Help needed to walk in hospital room?: A Little Help needed climbing 3-5 steps with a railing? : A Little 6 Click Score: 20    End of Session Equipment Utilized During Treatment: Gait belt Activity Tolerance: Patient tolerated treatment well (nausea) Patient left: in bed;with call bell/phone within reach Nurse Communication: Mobility status PT Visit Diagnosis: Other abnormalities of gait and mobility (R26.89)     Time: 1630-1650 PT Time Calculation (min) (ACUTE ONLY): 20 min  Charges:    $Gait Training: 8-22 mins  PT General Charges $$ ACUTE PT VISIT: 1 Visit                     Blanchard Kelch PT Acute Rehabilitation Services Office [phone] Weekend [phone]    Rada Hay 08/08/2023, 4:57  PM

## 2023-08-08 NOTE — Progress Notes (Signed)
 Subjective: 1 Day Post-Op Procedure(s) (LRB): ARTHROPLASTY, KNEE, TOTAL (Right) Patient reports pain as 4 on 0-10 scale.   Denies CP or SOB.  Voiding without difficulty. Positive flatus. Objective: Vital signs in last 24 hours: Temp:  [96.2 F (35.7 C)-98.1 F (36.7 C)] 97.8 F (36.6 C) (04/03 0518) Pulse Rate:  [49-75] 54 (04/03 0518) Resp:  [11-19] 18 (04/03 0518) BP: (97-128)/(57-74) 111/63 (04/03 0518) SpO2:  [78 %-100 %] 98 % (04/03 0518)  Intake/Output from previous day: 04/02 0701 - 04/03 0700 In: 3345 [P.O.:840; I.V.:1905; IV Piggyback:600] Out: 2550 [Urine:2500; Blood:50] Intake/Output this shift: No intake/output data recorded.  No results for input(s): "HGB" in the last 72 hours. No results for input(s): "WBC", "RBC", "HCT", "PLT" in the last 72 hours. Recent Labs    08/08/23 0339  NA 130*  K 4.5  CL 100  CO2 19*  BUN 17  CREATININE 0.56  GLUCOSE 141*  CALCIUM 8.6*   No results for input(s): "LABPT", "INR" in the last 72 hours.  Neurologically intact Neurovascular intact Sensation intact distally Dorsiflexion/Plantar flexion intact Incision: dressing C/D/I Compartment soft No DVT  Assessment/Plan:  1 Day Post-Op Procedure(s) (LRB): ARTHROPLASTY, KNEE, TOTAL (Right) Advance diet Up with therapy Discharge home with home health   Principal Problem:   Right knee DJD      Julia Murray 08/08/2023, @[facility] 

## 2023-08-09 ENCOUNTER — Other Ambulatory Visit (HOSPITAL_COMMUNITY): Payer: Self-pay

## 2023-08-09 DIAGNOSIS — M1711 Unilateral primary osteoarthritis, right knee: Secondary | ICD-10-CM | POA: Diagnosis not present

## 2023-08-09 LAB — CBC
HCT: 34.2 % — ABNORMAL LOW (ref 36.0–46.0)
Hemoglobin: 10.9 g/dL — ABNORMAL LOW (ref 12.0–15.0)
MCH: 30.8 pg (ref 26.0–34.0)
MCHC: 31.9 g/dL (ref 30.0–36.0)
MCV: 96.6 fL (ref 80.0–100.0)
Platelets: 221 10*3/uL (ref 150–400)
RBC: 3.54 MIL/uL — ABNORMAL LOW (ref 3.87–5.11)
RDW: 13.7 % (ref 11.5–15.5)
WBC: 7.9 10*3/uL (ref 4.0–10.5)
nRBC: 0 % (ref 0.0–0.2)

## 2023-08-09 MED ORDER — HYDROMORPHONE HCL 2 MG PO TABS
2.0000 mg | ORAL_TABLET | ORAL | 0 refills | Status: AC | PRN
  Filled 2023-08-09: qty 40, 7d supply, fill #0

## 2023-08-09 MED ORDER — METHOCARBAMOL 500 MG PO TABS
500.0000 mg | ORAL_TABLET | Freq: Four times a day (QID) | ORAL | 1 refills | Status: AC | PRN
  Filled 2023-08-16 – 2023-08-19 (×2): qty 40, 10d supply, fill #0

## 2023-08-09 MED ORDER — MELOXICAM 15 MG PO TABS: 15.0000 mg | ORAL_TABLET | Freq: Every day | ORAL | 1 refills | Status: AC

## 2023-08-09 MED ORDER — OMEPRAZOLE 20 MG PO CPDR: 20.0000 mg | DELAYED_RELEASE_CAPSULE | Freq: Every day | ORAL | 3 refills | Status: AC

## 2023-08-09 MED ORDER — DOCUSATE SODIUM 100 MG PO CAPS: 100.0000 mg | ORAL_CAPSULE | Freq: Two times a day (BID) | ORAL | 0 refills | Status: AC | PRN

## 2023-08-09 MED ORDER — POLYETHYLENE GLYCOL 3350 17 G PO PACK: 17.0000 g | PACK | Freq: Every day | ORAL | 0 refills | Status: AC

## 2023-08-09 MED ORDER — FLUTICASONE PROPIONATE 50 MCG/ACT NA SUSP: 1.0000 | Freq: Every day | NASAL | 1 refills | Status: AC

## 2023-08-09 MED ORDER — APIXABAN 2.5 MG PO TABS
2.5000 mg | ORAL_TABLET | Freq: Two times a day (BID) | ORAL | 1 refills | Status: AC
  Filled 2023-08-09: qty 60, 30d supply, fill #0

## 2023-08-09 MED ORDER — GABAPENTIN 300 MG PO CAPS
300.0000 mg | ORAL_CAPSULE | Freq: Three times a day (TID) | ORAL | Status: DC
  Administered 2023-08-09: 300 mg via ORAL
  Filled 2023-08-09: qty 1

## 2023-08-09 MED ORDER — GABAPENTIN 300 MG PO CAPS
300.0000 mg | ORAL_CAPSULE | Freq: Three times a day (TID) | ORAL | 1 refills | Status: AC
  Filled 2023-08-29 – 2023-09-06 (×3): qty 90, 30d supply, fill #0

## 2023-08-09 MED ORDER — HYDROMORPHONE HCL 2 MG PO TABS: 2.0000 mg | ORAL_TABLET | ORAL | 0 refills | Status: AC | PRN

## 2023-08-09 NOTE — Progress Notes (Signed)
 Physical Therapy Treatment Patient Details Name: Julia Murray MRN: [id number] DOB: 1955-10-30 Today's Date: 08/09/2023   History of Present Illness 68 yo female s/p R TKA on 08/07/23. PMH: IM nail L femur 2024, hardware removal L femur 2024, L TKA 2022, GERD, HLD, arthritis, Covid    PT Comments  Pt is progressing well with mobility, she ambulated 140' with RW, no loss of balance. Stair training completed. Pt demonstrates good understanding of HEP. She is ready to DC from a PT standpoint.     If plan is discharge home, recommend the following: A little help with walking and/or transfers;A little help with bathing/dressing/bathroom;Assistance with cooking/housework;Assist for transportation;Help with stairs or ramp for entrance   Can travel by private vehicle        Equipment Recommendations  Rolling walker (2 wheels)    Recommendations for Other Services       Precautions / Restrictions Precautions Precautions: Knee;Fall Recall of Precautions/Restrictions: Intact Restrictions Weight Bearing Restrictions Per Provider Order: No Other Position/Activity Restrictions: WBAT     Mobility  Bed Mobility Overal bed mobility: Needs Assistance Bed Mobility: Supine to Sit     Supine to sit: Min assist, HOB elevated, Used rails     General bed mobility comments: min  A for RLE, instructed pt to assist RLE with LLE    Transfers Overall transfer level: Needs assistance Equipment used: Rolling walker (2 wheels)   Sit to Stand: Supervision           General transfer comment: cues for proper hand placement and RLE position    Ambulation/Gait Ambulation/Gait assistance: Supervision Gait Distance (Feet): 140 Feet Assistive device: Rolling walker (2 wheels) Gait Pattern/deviations: Step-to pattern, Step-through pattern, Antalgic Gait velocity: decr     General Gait Details: steady with RW, no loss of balance   Stairs Stairs: Yes Stairs assistance: Supervision Stair  Management: Step to pattern, Two rails, Forwards Number of Stairs: 3 General stair comments: VCs sequencing   Wheelchair Mobility     Tilt Bed    Modified Rankin (Stroke Patients Only)       Balance Overall balance assessment: No apparent balance deficits (not formally assessed)                                          Communication Communication Communication: No apparent difficulties  Cognition Arousal: Alert Behavior During Therapy: WFL for tasks assessed/performed   PT - Cognitive impairments: No apparent impairments                         Following commands: Intact      Cueing Cueing Techniques: Verbal cues  Exercises Total Joint Exercises Quad Sets: AROM, Right, 10 reps Short Arc Quad: AROM, Right, Supine, 5 reps Heel Slides: AAROM, Right, 5 reps Hip ABduction/ADduction: AROM, Right, 5 reps Straight Leg Raises: AAROM, Right, Supine, 5 reps Long Arc Quad: AROM, Right, 5 reps, Seated Knee Flexion: AAROM, 5 reps, Right, Seated    General Comments        Pertinent Vitals/Pain Pain Assessment Pain Score: 7  Pain Location: R knee Pain Descriptors / Indicators: Discomfort Pain Intervention(s): Limited activity within patient's tolerance, Monitored during session, Premedicated before session, Ice applied    Home Living  Prior Function            PT Goals (current goals can now be found in the care plan section) Acute Rehab PT Goals Patient Stated Goal[place] after knee rehab PT Goal Formulation: With patient Time For Goal Achievement: 08/14/23 Potential to Achieve Goals: Good Progress towards PT goals: Goals met/education completed, patient discharged from PT    Frequency    7X/week      PT Plan      Co-evaluation              AM-PAC PT "6 Clicks" Mobility   Outcome Measure  Help needed turning from your back to your side while in a flat bed without using  bedrails?: None Help needed moving from lying on your back to sitting on the side of a flat bed without using bedrails?: A Little Help needed moving to and from a bed to a chair (including a wheelchair)?: None Help needed standing up from a chair using your arms (e.g., wheelchair or bedside chair)?: None Help needed to walk in hospital room?: None Help needed climbing 3-5 steps with a railing? : None 6 Click Score: 23    End of Session Equipment Utilized During Treatment: Gait belt Activity Tolerance: Patient tolerated treatment well (nausea) Patient left: in bed;with call bell/phone within reach Nurse Communication: Mobility status PT Visit Diagnosis: Other abnormalities of gait and mobility (R26.89)     Time: 9562-1308 PT Time Calculation (min) (ACUTE ONLY): 32 min  Charges:    $Gait Training: 8-22 mins $Therapeutic Exercise: 8-22 mins PT General Charges $$ ACUTE PT VISIT: 1 Visit                    Tamala Ser PT 08/09/2023  Acute Rehabilitation Services  Office [phone]

## 2023-08-09 NOTE — Progress Notes (Signed)
Patient discharged to home w/ family. Given all belongings, instructions, equipment. Verbalized understanding of instructions. Escorted to pov via w/c. 

## 2023-08-09 NOTE — Progress Notes (Signed)
 Subjective: 2 Days Post-Op Procedure(s) (LRB): ARTHROPLASTY, KNEE, TOTAL (Right) Patient reports pain as severe.  Hoping to go home today but noting significant pain. No other c/o.  Objective: Vital signs in last 24 hours: Temp:  [97.8 F (36.6 C)-98.1 F (36.7 C)] 98.1 F (36.7 C) (04/04 0513) Pulse Rate:  [69-88] 83 (04/04 0513) Resp:  [15-19] 15 (04/04 0513) BP: (119-138)/(65-71) 138/65 (04/04 0513) SpO2:  [94 %-96 %] 94 % (04/04 0513)  Intake/Output from previous day: 04/03 0701 - 04/04 0700 In: 780 [P.O.:780] Out: 1 [Urine:1] Intake/Output this shift: No intake/output data recorded.  Recent Labs    08/08/23 0633 08/09/23 0848  HGB 12.1 10.9*   Recent Labs    08/08/23 0633 08/09/23 0848  WBC 10.0 7.9  RBC 3.94 3.54*  HCT 39.2 34.2*  PLT 235 221   Recent Labs    08/08/23 0339  NA 130*  K 4.5  CL 100  CO2 19*  BUN 17  CREATININE 0.56  GLUCOSE 141*  CALCIUM 8.6*   No results for input(s): "LABPT", "INR" in the last 72 hours.  Neurologically intact ABD soft Neurovascular intact Sensation intact distally Intact pulses distally Dorsiflexion/Plantar flexion intact Incision: dressing C/D/I and no drainage No cellulitis present Compartment soft No sign of DVT   Assessment/Plan: 2 Days Post-Op Procedure(s) (LRB): ARTHROPLASTY, KNEE, TOTAL (Right) Advance diet Up with therapy D/C IV fluids Dilaudid ordered for 2-3mg  PO at a time, can increase to 3mg  dose to try to get better pain control Add gabapentin TID Discussed with Dr. Tenna Delaine Doralee Albino 08/09/2023, 10:39 AM

## 2023-08-09 NOTE — Progress Notes (Signed)
 PT Cancellation Note  Patient Details Name: Julia Murray MRN: [id number] DOB: 08-01-1955   Cancelled Treatment:    Reason Eval/Treat Not Completed: Pain limiting ability to participate (will follow. RN aware of pt request for pain medication.)   Tamala Ser PT 08/09/2023  Acute Rehabilitation Services  Office [phone]

## 2023-08-09 NOTE — Discharge Summary (Signed)
 Physician Discharge Summary   Patient ID: Julia Murray MRN: [id number] DOB/AGE: 05/16/1955 68 y.o.  Admit date: 08/07/2023 Discharge date: 08/09/23  Primary Diagnosis: primary right knee osteoarthritis  Admission Diagnoses:  Past Medical History:  Diagnosis Date   Arthritis    knee, back   COVID-19    2020   GERD (gastroesophageal reflux disease)    Hyperlipidemia    PONV (postoperative nausea and vomiting)    Discharge Diagnoses:   Principal Problem:   Right knee DJD  Estimated body mass index is 29.06 kg/m as calculated from the following:   Height as of this encounter: 5' 4.5" (1.638 m).   Weight as of this encounter: 78 kg.  Procedure:  Procedure(s) (LRB): ARTHROPLASTY, KNEE, TOTAL (Right)   Consults: None  HPI: see H&P Laboratory Data: Admission on 08/07/2023  Component Date Value Ref Range Status   Sodium 08/08/2023 130 (L)  135 - 145 mmol/L Final   Potassium 08/08/2023 4.5  3.5 - 5.1 mmol/L Final   Chloride 08/08/2023 100  98 - 111 mmol/L Final   CO2 08/08/2023 19 (L)  22 - 32 mmol/L Final   Glucose, Bld 08/08/2023 141 (H)  70 - 99 mg/dL Final   Glucose reference range applies only to samples taken after fasting for at least 8 hours.   BUN 08/08/2023 17  8 - 23 mg/dL Final   Creatinine, Ser 08/08/2023 0.56  0.44 - 1.00 mg/dL Final   Calcium 40/98/1191 8.6 (L)  8.9 - 10.3 mg/dL Final   GFR, Estimated 08/08/2023 >60  >60 mL/min Final   Comment: (NOTE) Calculated using the CKD-EPI Creatinine Equation (2021)    Anion gap 08/08/2023 11  5 - 15 Final   Performed at [facility], [address] [place], [place] [address]   WBC 08/08/2023 10.0  4.0 - 10.5 K/uL Final   RBC 08/08/2023 3.94  3.87 - 5.11 MIL/uL Final   Hemoglobin 08/08/2023 12.1  12.0 - 15.0 g/dL Final   HCT 56/21/3086 39.2  36.0 - 46.0 % Final   MCV 08/08/2023 99.5  80.0 - 100.0 fL Final   MCH 08/08/2023 30.7  26.0 - 34.0 pg Final   MCHC 08/08/2023 30.9  30.0 - 36.0 g/dL Final    RDW 57/84/6962 14.0  11.5 - 15.5 % Final   Platelets 08/08/2023 235  150 - 400 K/uL Final   nRBC 08/08/2023 0.0  0.0 - 0.2 % Final   Performed at [facility], [address], [place], [place] [address]   WBC 08/09/2023 7.9  4.0 - 10.5 K/uL Final   RBC 08/09/2023 3.54 (L)  3.87 - 5.11 MIL/uL Final   Hemoglobin 08/09/2023 10.9 (L)  12.0 - 15.0 g/dL Final   HCT 13/24/4010 34.2 (L)  36.0 - 46.0 % Final   MCV 08/09/2023 96.6  80.0 - 100.0 fL Final   MCH 08/09/2023 30.8  26.0 - 34.0 pg Final   MCHC 08/09/2023 31.9  30.0 - 36.0 g/dL Final   RDW 27/25/3664 13.7  11.5 - 15.5 % Final   Platelets 08/09/2023 221  150 - 400 K/uL Final   nRBC 08/09/2023 0.0  0.0 - 0.2 % Final   Performed at [facility], [address], [place], [place] [address]  Hospital Outpatient Visit on 07/29/2023  Component Date Value Ref Range Status   MRSA, PCR 07/29/2023 NEGATIVE  NEGATIVE Final   Staphylococcus aureus 07/29/2023 NEGATIVE  NEGATIVE Final   Comment: (NOTE) The Xpert SA  Assay (FDA approved for NASAL specimens in patients 78 years of age and older), is one component of a comprehensive surveillance program. It is not intended to diagnose infection nor to guide or monitor treatment. Performed at [facility], [address] [place], [place] [address]    WBC 07/29/2023 5.9  4.0 - 10.5 K/uL Final   RBC 07/29/2023 4.44  3.87 - 5.11 MIL/uL Final   Hemoglobin 07/29/2023 13.3  12.0 - 15.0 g/dL Final   HCT 09/81/1914 43.1  36.0 - 46.0 % Final   MCV 07/29/2023 97.1  80.0 - 100.0 fL Final   MCH 07/29/2023 30.0  26.0 - 34.0 pg Final   MCHC 07/29/2023 30.9  30.0 - 36.0 g/dL Final   RDW 78/29/5621 14.0  11.5 - 15.5 % Final   Platelets 07/29/2023 282  150 - 400 K/uL Final   nRBC 07/29/2023 0.0  0.0 - 0.2 % Final   Performed at [facility], [address], [place], [place] [address]     X-Rays:DG Knee 1-2 Views Right Result Date:  08/07/2023 CLINICAL DATA:  Status post total knee replacement. EXAM: RIGHT KNEE - 1-2 VIEW COMPARISON:  12/20/2009. FINDINGS: Status post right total knee arthroplasty. Hardware appears well seated with appropriate alignment. No acute fracture or dislocation. Expected postoperative soft tissue swelling and soft tissue gas anteriorly. IMPRESSION: Expected postoperative changes status post right total knee arthroplasty. Electronically Signed   By: Hart Robinsons M.D.   On: 08/07/2023 12:08    EKG: Orders placed or performed during the hospital encounter of 07/09/22   ED EKG   ED EKG   EKG 12-Lead   EKG 12-Lead     Hospital Course: Julia Murray is a 68 y.o. who was admitted to [facility]. They were brought to the operating room on 08/07/2023 and underwent Procedure(s): ARTHROPLASTY, KNEE, TOTAL.  Patient tolerated the procedure well and was later transferred to the recovery room and then to the orthopaedic floor for postoperative care.  They were given PO and IV analgesics for pain control following their surgery.  They were given 24 hours of postoperative antibiotics of  Anti-infectives (From admission, onward)    Start     Dose/Rate Route Frequency Ordered Stop   08/07/23 1400  ceFAZolin (ANCEF) IVPB 2g/100 mL premix        2 g 200 mL/hr over 30 Minutes Intravenous Every 6 hours 08/07/23 1233 08/08/23 0303   08/07/23 1233  valACYclovir (VALTREX) tablet 500 mg       Note to Pharmacy: TAKE ONE TABLET BY MOUTH ONCE WEEKLY WHICH PREVENTS OUTBREAKS OF HSV     500 mg Oral Daily PRN 08/07/23 1233     08/07/23 0615  ceFAZolin (ANCEF) IVPB 2g/100 mL premix        2 g 200 mL/hr over 30 Minutes Intravenous On call to O.R. 08/07/23 7846 08/07/23 0843      and started on DVT prophylaxis in the form of TED hose and Eliquis and SCDs .   PT and OT were ordered for total joint protocol.  Discharge planning consulted to help with postop disposition and equipment needs.  Patient had a difficult  night on the evening of surgery.  They started to get up OOB with therapy on day one. Continued to work with therapy into day two.  By day two, the patient had progressed with therapy and meeting their goals.  Incision was healing well.  Patient was seen in rounds and  was ready to go home.   Diet: Regular diet Activity:WBAT Follow-up:in 10-14 days Disposition - Home Discharged Condition: good   Discharge Instructions     Call MD / Call 911   Complete by: As directed    If you experience chest pain or shortness of breath, CALL 911 and be transported to the hospital emergency room.  If you develope a fever above 101 F, pus (white drainage) or increased drainage or redness at the wound, or calf pain, call your surgeon's office.   Constipation Prevention   Complete by: As directed    Drink plenty of fluids.  Prune juice may be helpful.  You may use a stool softener, such as Colace (over the counter) 100 mg twice a day.  Use MiraLax (over the counter) for constipation as needed.   Diet - low sodium heart healthy   Complete by: As directed    Increase activity slowly as tolerated   Complete by: As directed    Post-operative opioid taper instructions:   Complete by: As directed    POST-OPERATIVE OPIOID TAPER INSTRUCTIONS: It is important to wean off of your opioid medication as soon as possible. If you do not need pain medication after your surgery it is ok to stop day one. Opioids include: Codeine, Hydrocodone(Norco, Vicodin), Oxycodone(Percocet, oxycontin) and hydromorphone amongst others.  Long term and even short term use of opiods can cause: Increased pain response Dependence Constipation Depression Respiratory depression And more.  Withdrawal symptoms can include Flu like symptoms Nausea, vomiting And more Techniques to manage these symptoms Hydrate well Eat regular healthy meals Stay active Use relaxation techniques(deep breathing, meditating, yoga) Do Not substitute  Alcohol to help with tapering If you have been on opioids for less than two weeks and do not have pain than it is ok to stop all together.  Plan to wean off of opioids This plan should start within one week post op of your joint replacement. Maintain the same interval or time between taking each dose and first decrease the dose.  Cut the total daily intake of opioids by one tablet each day Next start to increase the time between doses. The last dose that should be eliminated is the evening dose.         Allergies as of 08/09/2023       Reactions   Shellfish Allergy Hives, Swelling   Bee Venom Swelling   Crab Extract Hives, Swelling        Medication List     STOP taking these medications    HYDROcodone-acetaminophen 5-325 MG tablet Commonly known as: NORCO/VICODIN   meloxicam 15 MG tablet Commonly known as: MOBIC       TAKE these medications    acetaminophen 325 MG tablet Commonly known as: TYLENOL Take 1-2 tablets (325-650 mg total) by mouth every 6 (six) hours as needed for mild pain (pain score 1-3 or temp > 100.5).   apixaban 2.5 MG Tabs tablet Commonly known as: Eliquis Take 1 tablet (2.5 mg total) by mouth 2 (two) times daily.   docusate sodium 100 MG capsule Commonly known as: COLACE Take 1 capsule (100 mg total) by mouth 2 (two) times daily as needed for mild constipation.   fluticasone 50 MCG/ACT nasal spray Commonly known as: FLONASE SPRAY TWO SPRAYS IN EACH NOSTRIL ONCE DAILY What changed: See the new instructions.   gabapentin 300 MG capsule Commonly known as: NEURONTIN Take 1 capsule (300 mg total) by mouth 3 (three) times daily.  HYDROmorphone 2 MG tablet Commonly known as: DILAUDID Take 1-1.5 tablets (2-3 mg total) by mouth every 4 (four) hours as needed for severe pain (pain score 7-10) (pain score 7-10).   methocarbamol 500 MG tablet Commonly known as: ROBAXIN Take 1 tablet (500 mg total) by mouth every 6 (six) hours as needed for  muscle spasms.   omeprazole 20 MG capsule Commonly known as: PRILOSEC Take 20 mg by mouth daily before breakfast.   polyethylene glycol 17 g packet Commonly known as: MIRALAX / GLYCOLAX Take 17 g by mouth daily.   valACYclovir 500 MG tablet Commonly known as: VALTREX TAKE ONE TABLET BY MOUTH ONCE WEEKLY WHICH PREVENTS OUTBREAKS OF HSV What changed:  how much to take how to take this when to take this reasons to take this additional instructions         Signed: Andrez Grime, PA-C Orthopaedic Surgery 08/09/2023, 11:57 AM

## 2023-08-09 NOTE — TOC Transition Note (Signed)
 Transition of Care [facility]) - Discharge Note   Patient Details  Name: Julia Murray MRN: [id number] Date of Birth: June 27, 1955  [facility] CM/SW Contact:  Howell Rucks, RN Phone Number: 08/09/2023, 12:04 PM   Clinical Narrative: Patient changed to Observation status at 11:47am, DC order entered at 11:54am, no MOON required.    Final next level of care: OP Rehab Barriers to Discharge: No Barriers Identified   Patient Goals and CMS Choice Patient states their goals for this hospitalization and ongoing recovery are:: return home          Discharge Placement                       Discharge Plan and Services Additional resources added to the After Visit Summary for                                       Social Drivers of Health (SDOH) Interventions SDOH Screenings   Food Insecurity: No Food Insecurity (08/07/2023)  Housing: Low Risk  (08/07/2023)  Transportation Needs: No Transportation Needs (08/07/2023)  Utilities: Not At Risk (08/07/2023)  Depression (PHQ2-9): Low Risk  (02/28/2021)  Financial Resource Strain: Low Risk  (06/10/2023)   Received from [facility]  Physical Activity: Sufficiently Active (10/15/2022)   Received from [facility]  Social Connections: Socially Integrated (08/07/2023)  Stress: No Stress Concern Present (10/15/2022)   Received from [facility]  Tobacco Use: Low Risk  (08/07/2023)     Readmission Risk Interventions     No data to display

## 2023-08-12 ENCOUNTER — Other Ambulatory Visit (HOSPITAL_COMMUNITY): Payer: Self-pay

## 2023-08-13 ENCOUNTER — Other Ambulatory Visit (HOSPITAL_COMMUNITY): Payer: Self-pay

## 2023-08-16 ENCOUNTER — Other Ambulatory Visit (HOSPITAL_COMMUNITY): Payer: Self-pay

## 2023-08-19 ENCOUNTER — Other Ambulatory Visit (HOSPITAL_COMMUNITY): Payer: Self-pay

## 2023-08-20 ENCOUNTER — Other Ambulatory Visit (HOSPITAL_COMMUNITY): Payer: Self-pay

## 2023-08-21 ENCOUNTER — Other Ambulatory Visit (HOSPITAL_COMMUNITY): Payer: Self-pay

## 2023-08-21 MED ORDER — HYDROMORPHONE HCL 2 MG PO TABS
2.0000 mg | ORAL_TABLET | ORAL | 0 refills | Status: AC
  Filled 2023-08-21: qty 30, 5d supply, fill #0

## 2023-08-29 ENCOUNTER — Other Ambulatory Visit (HOSPITAL_COMMUNITY): Payer: Self-pay

## 2023-09-02 ENCOUNTER — Other Ambulatory Visit (HOSPITAL_COMMUNITY): Payer: Self-pay

## 2023-09-06 ENCOUNTER — Other Ambulatory Visit (HOSPITAL_COMMUNITY): Payer: Self-pay
